# Patient Record
Sex: Male | Born: 1955 | Race: White | Hispanic: No | Smoking: Former smoker
Health system: Southern US, Community
[De-identification: ages and names within clinical notes are randomized; demographics above are authoritative.]

## PROBLEM LIST (undated history)

## (undated) DIAGNOSIS — R51 Headache: Secondary | ICD-10-CM

## (undated) DIAGNOSIS — E785 Hyperlipidemia, unspecified: Secondary | ICD-10-CM

## (undated) DIAGNOSIS — R05 Cough: Secondary | ICD-10-CM

## (undated) DIAGNOSIS — Z87891 Personal history of nicotine dependence: Secondary | ICD-10-CM

## (undated) DIAGNOSIS — R079 Chest pain, unspecified: Secondary | ICD-10-CM

## (undated) DIAGNOSIS — G8929 Other chronic pain: Secondary | ICD-10-CM

## (undated) DIAGNOSIS — I2510 Atherosclerotic heart disease of native coronary artery without angina pectoris: Secondary | ICD-10-CM

## (undated) DIAGNOSIS — M797 Fibromyalgia: Secondary | ICD-10-CM

## (undated) DIAGNOSIS — R059 Cough, unspecified: Secondary | ICD-10-CM

## (undated) DIAGNOSIS — I10 Essential (primary) hypertension: Secondary | ICD-10-CM

## (undated) DIAGNOSIS — I313 Pericardial effusion (noninflammatory): Secondary | ICD-10-CM

## (undated) DIAGNOSIS — J189 Pneumonia, unspecified organism: Secondary | ICD-10-CM

## (undated) DIAGNOSIS — M542 Cervicalgia: Secondary | ICD-10-CM

## (undated) DIAGNOSIS — I3139 Other pericardial effusion (noninflammatory): Secondary | ICD-10-CM

## (undated) DIAGNOSIS — Z720 Tobacco use: Secondary | ICD-10-CM

## (undated) HISTORY — PX: NASAL SINUS SURGERY: SHX719

## (undated) HISTORY — DX: Chest pain, unspecified: R07.9

## (undated) HISTORY — DX: Cough: R05

## (undated) HISTORY — DX: Tobacco use: Z72.0

## (undated) HISTORY — PX: APPENDECTOMY: SHX54

## (undated) HISTORY — PX: MOUTH SURGERY: SHX715

## (undated) HISTORY — DX: Cough, unspecified: R05.9

---

## 1998-01-28 ENCOUNTER — Emergency Department (HOSPITAL_COMMUNITY): Admission: EM | Admit: 1998-01-28 | Discharge: 1998-01-28 | Payer: Self-pay

## 2001-08-07 ENCOUNTER — Ambulatory Visit (HOSPITAL_BASED_OUTPATIENT_CLINIC_OR_DEPARTMENT_OTHER): Admission: RE | Admit: 2001-08-07 | Discharge: 2001-08-07 | Payer: Self-pay | Admitting: Otolaryngology

## 2001-10-18 ENCOUNTER — Encounter: Admission: RE | Admit: 2001-10-18 | Discharge: 2002-01-16 | Payer: Self-pay | Admitting: Internal Medicine

## 2002-05-07 ENCOUNTER — Encounter: Payer: Self-pay | Admitting: Internal Medicine

## 2002-05-07 ENCOUNTER — Ambulatory Visit (HOSPITAL_COMMUNITY): Admission: RE | Admit: 2002-05-07 | Discharge: 2002-05-07 | Payer: Self-pay | Admitting: Internal Medicine

## 2002-10-26 ENCOUNTER — Emergency Department (HOSPITAL_COMMUNITY): Admission: EM | Admit: 2002-10-26 | Discharge: 2002-10-26 | Payer: Self-pay | Admitting: Emergency Medicine

## 2002-10-26 ENCOUNTER — Encounter: Payer: Self-pay | Admitting: Emergency Medicine

## 2002-11-12 ENCOUNTER — Encounter: Payer: Self-pay | Admitting: Internal Medicine

## 2002-11-12 ENCOUNTER — Encounter: Admission: RE | Admit: 2002-11-12 | Discharge: 2002-11-12 | Payer: Self-pay | Admitting: Internal Medicine

## 2003-01-12 ENCOUNTER — Encounter: Payer: Self-pay | Admitting: Emergency Medicine

## 2003-01-12 ENCOUNTER — Emergency Department (HOSPITAL_COMMUNITY): Admission: EM | Admit: 2003-01-12 | Discharge: 2003-01-12 | Payer: Self-pay | Admitting: Emergency Medicine

## 2003-10-29 ENCOUNTER — Encounter: Admission: RE | Admit: 2003-10-29 | Discharge: 2003-10-29 | Payer: Self-pay | Admitting: Internal Medicine

## 2004-07-14 ENCOUNTER — Ambulatory Visit: Payer: Self-pay | Admitting: Internal Medicine

## 2004-10-01 ENCOUNTER — Ambulatory Visit: Payer: Self-pay | Admitting: Internal Medicine

## 2004-11-30 ENCOUNTER — Ambulatory Visit: Payer: Self-pay | Admitting: Internal Medicine

## 2005-01-04 ENCOUNTER — Emergency Department (HOSPITAL_COMMUNITY): Admission: EM | Admit: 2005-01-04 | Discharge: 2005-01-04 | Payer: Self-pay | Admitting: Emergency Medicine

## 2005-09-30 ENCOUNTER — Ambulatory Visit: Payer: Self-pay | Admitting: Internal Medicine

## 2006-10-19 ENCOUNTER — Ambulatory Visit: Payer: Self-pay | Admitting: Internal Medicine

## 2006-11-22 ENCOUNTER — Ambulatory Visit: Payer: Self-pay | Admitting: Internal Medicine

## 2007-01-24 ENCOUNTER — Ambulatory Visit: Payer: Self-pay | Admitting: Internal Medicine

## 2007-03-21 ENCOUNTER — Telehealth: Payer: Self-pay | Admitting: Internal Medicine

## 2007-04-12 DIAGNOSIS — R519 Headache, unspecified: Secondary | ICD-10-CM | POA: Insufficient documentation

## 2007-04-12 DIAGNOSIS — R51 Headache: Secondary | ICD-10-CM

## 2007-04-26 ENCOUNTER — Ambulatory Visit: Payer: Self-pay | Admitting: Internal Medicine

## 2007-04-26 DIAGNOSIS — IMO0001 Reserved for inherently not codable concepts without codable children: Secondary | ICD-10-CM

## 2007-08-11 ENCOUNTER — Ambulatory Visit: Payer: Self-pay | Admitting: Internal Medicine

## 2007-08-11 DIAGNOSIS — M542 Cervicalgia: Secondary | ICD-10-CM | POA: Insufficient documentation

## 2007-08-15 ENCOUNTER — Encounter: Payer: Self-pay | Admitting: Internal Medicine

## 2007-08-15 ENCOUNTER — Telehealth: Payer: Self-pay | Admitting: Internal Medicine

## 2007-08-16 ENCOUNTER — Telehealth: Payer: Self-pay | Admitting: Internal Medicine

## 2007-10-11 ENCOUNTER — Ambulatory Visit: Payer: Self-pay | Admitting: Internal Medicine

## 2008-01-29 ENCOUNTER — Ambulatory Visit: Payer: Self-pay | Admitting: Internal Medicine

## 2008-01-29 DIAGNOSIS — N410 Acute prostatitis: Secondary | ICD-10-CM

## 2008-01-29 DIAGNOSIS — N10 Acute tubulo-interstitial nephritis: Secondary | ICD-10-CM

## 2008-12-20 ENCOUNTER — Emergency Department (HOSPITAL_COMMUNITY): Admission: EM | Admit: 2008-12-20 | Discharge: 2008-12-20 | Payer: Self-pay | Admitting: Emergency Medicine

## 2010-12-18 NOTE — Op Note (Signed)
[place]  Patient:    Michael Spears, Michael Spears Visit Number: [id number] MRN: [id number]          Service Type: DSU Location: [facility] Attending Physician:  Susy Frizzle Dictated by:   Jeannett Senior Pollyann Kennedy, M.D. Proc. Date: 08/07/01 Admit Date:  08/07/2001   CC:         Stacie Glaze, M.D. [facility]   Operative Report  PREOPERATIVE DIAGNOSES: 1. Nasal septal deviation. 2. Inferior turbinate hypertrophy.  POSTOPERATIVE DIAGNOSES: 1. Nasal septal deviation. 2. Inferior turbinate hypertrophy.  OPERATION: 1. Nasal septoplasty. 2. Submucous resection inferior turbinates bilaterally.  SURGEON:  Jefry H. Pollyann Kennedy, M.D.  ANESTHESIA:  General endotracheal  COMPLICATIONS:  None.  ESTIMATED BLOOD LOSS: 15 cc  FINDINGS:  Moderate enlargement of the inferior turbinate with thickened bone in the anterior half on both sides.  Large bony spur on the left inferior nasal septum created by the left lamina of the maxillary crest being deflected towards the left.  Mild to moderate deviation of the ethmoid plate towards the right side causing partial obstruction on the right posteriorly.  REFERRING PHYSICIAN:  Stacie Glaze, M.D.  HISTORY:  This is a 55 year old gentleman with a history of chronic nasal obstruction.  The risks, benefits, alternatives, and complications to the procedure were explained to the patient who seemed to understand and agreed to surgery.  DESCRIPTION OF PROCEDURE:  The patient was taken to the operating room and placed on the operating room table in the supine position. Following induction of general endotracheal anesthesia, the table was turned and the patient was properly positioned.  Xylocaine 1% with epinephrine was infiltrated into the septum, columella and inferior turbinates bilaterally.  A total of 5 cc was used.  Afrin soaked pledgets were placed in the nasal cavities bilaterally. Heparin spray was also used preoperatively.  1. Nasal  septoplasty.  Left hemitransfixion incision was created with a 15 scalpel used to approach the septal cartilage and mucoperichondrial flap was developed posteriorly down the left side to the sphenoid rostrum.  The bony cartilaginous junction was divided and a similar flap was developed posteriorly down the right side.  The superior attachment of the ethmoid plate was taken down with an open Jansen-Middleton rongeur.  The posterior middle attachments were taken down also with rongeurs and large fragments of deflected ethmoid plate were resected.  The mucosa was elevated off of the left anterior lower aspect of the septum where the large bony spur was present.  A 4 mm osteotome was used to resect this long fragment of bony spur. The mucosal incision was reapproximated with 4-0 chromic suture.  The septal flaps were quilted with 4-0 plain gut.  2. Submucous resection of inferior turbinates.  The leading edge of the inferior turbinates were incised vertically with a scalpel.  The mucosa was elevated off the medial lateral and inferior aspect of the turbinate bone. Large thickened fragments of turbinate bone were resected using Takahashi forceps.  The mucosa was left intact and viable.  The turbinate remnants were out fractured with a Therapist, nutritional.  The nasal cavities were suctioned of blood and secretions and packed with rolled up Telfa gauze coated with Bacitracin ointment.  The patient was then awakened, extubated and transferred to recovery. Dictated by:   Jeannett Senior Pollyann Kennedy, M.D. Attending Physician:  Susy Frizzle DD:  08/07/01 TD:  08/07/01 Job: [id number]

## 2011-12-06 ENCOUNTER — Emergency Department (HOSPITAL_COMMUNITY): Payer: PRIVATE HEALTH INSURANCE

## 2011-12-06 ENCOUNTER — Inpatient Hospital Stay (HOSPITAL_COMMUNITY)
Admission: EM | Admit: 2011-12-06 | Discharge: 2011-12-15 | DRG: 234 | Disposition: A | Discharge status: Home / Self Care | Payer: PRIVATE HEALTH INSURANCE | Source: Ambulatory Visit | Attending: Cardiothoracic Surgery | Admitting: Cardiothoracic Surgery

## 2011-12-06 DIAGNOSIS — I214 Non-ST elevation (NSTEMI) myocardial infarction: Secondary | ICD-10-CM

## 2011-12-06 DIAGNOSIS — I10 Essential (primary) hypertension: Secondary | ICD-10-CM | POA: Diagnosis present

## 2011-12-06 DIAGNOSIS — I200 Unstable angina: Secondary | ICD-10-CM | POA: Diagnosis present

## 2011-12-06 DIAGNOSIS — K5900 Constipation, unspecified: Secondary | ICD-10-CM | POA: Diagnosis not present

## 2011-12-06 DIAGNOSIS — Z6829 Body mass index (BMI) 29.0-29.9, adult: Secondary | ICD-10-CM

## 2011-12-06 DIAGNOSIS — I2510 Atherosclerotic heart disease of native coronary artery without angina pectoris: Principal | ICD-10-CM

## 2011-12-06 DIAGNOSIS — D62 Acute posthemorrhagic anemia: Secondary | ICD-10-CM | POA: Diagnosis not present

## 2011-12-06 DIAGNOSIS — E785 Hyperlipidemia, unspecified: Secondary | ICD-10-CM | POA: Diagnosis present

## 2011-12-06 DIAGNOSIS — Z87891 Personal history of nicotine dependence: Secondary | ICD-10-CM

## 2011-12-06 DIAGNOSIS — D696 Thrombocytopenia, unspecified: Secondary | ICD-10-CM | POA: Diagnosis not present

## 2011-12-06 DIAGNOSIS — Z951 Presence of aortocoronary bypass graft: Secondary | ICD-10-CM

## 2011-12-06 DIAGNOSIS — E669 Obesity, unspecified: Secondary | ICD-10-CM | POA: Diagnosis present

## 2011-12-06 LAB — CBC
MCHC: 35.6 g/dL (ref 30.0–36.0)
RDW: 12.8 % (ref 11.5–15.5)

## 2011-12-06 LAB — DIFFERENTIAL
Lymphocytes Relative: 33 % (ref 12–46)
Neutro Abs: 4.3 10*3/uL (ref 1.7–7.7)
Neutrophils Relative %: 54 % (ref 43–77)

## 2011-12-06 LAB — BASIC METABOLIC PANEL
CO2: 26 mEq/L (ref 19–32)
Calcium: 10 mg/dL (ref 8.4–10.5)
Chloride: 103 mEq/L (ref 96–112)
Creatinine, Ser: 1.34 mg/dL (ref 0.50–1.35)
Glucose, Bld: 86 mg/dL (ref 70–99)
Potassium: 4.5 mEq/L (ref 3.5–5.1)
Sodium: 139 mEq/L (ref 135–145)

## 2011-12-06 MED ORDER — ASPIRIN 81 MG PO CHEW
324.0000 mg | CHEWABLE_TABLET | Freq: Once | ORAL | Status: AC
  Administered 2011-12-06: 324 mg via ORAL
  Filled 2011-12-06: qty 4

## 2011-12-06 MED ORDER — SIMVASTATIN 40 MG PO TABS
40.0000 mg | ORAL_TABLET | Freq: Every day | ORAL | Status: DC
  Administered 2011-12-07 – 2011-12-09 (×3): 40 mg via ORAL
  Filled 2011-12-06 (×4): qty 1

## 2011-12-06 MED ORDER — HEPARIN (PORCINE) IN NACL 100-0.45 UNIT/ML-% IJ SOLN
1300.0000 [IU]/h | INTRAMUSCULAR | Status: DC
  Administered 2011-12-06: 1300 [IU]/h via INTRAVENOUS
  Filled 2011-12-06 (×3): qty 250

## 2011-12-06 MED ORDER — ACETAMINOPHEN 325 MG PO TABS
650.0000 mg | ORAL_TABLET | ORAL | Status: DC | PRN
  Administered 2011-12-06: 650 mg via ORAL
  Filled 2011-12-06: qty 2

## 2011-12-06 MED ORDER — HEPARIN BOLUS VIA INFUSION: 4000.0000 [IU] | Freq: Once | INTRAVENOUS | Status: DC

## 2011-12-06 MED ORDER — ONDANSETRON HCL 4 MG/2ML IJ SOLN: 4.0000 mg | Freq: Four times a day (QID) | INTRAMUSCULAR | Status: DC | PRN

## 2011-12-06 MED ORDER — ASPIRIN EC 81 MG PO TBEC
81.0000 mg | DELAYED_RELEASE_TABLET | Freq: Every day | ORAL | Status: DC
  Administered 2011-12-07 – 2011-12-09 (×3): 81 mg via ORAL
  Filled 2011-12-06 (×4): qty 1

## 2011-12-06 MED ORDER — NITROGLYCERIN IN D5W 200-5 MCG/ML-% IV SOLN
5.0000 ug/min | Freq: Once | INTRAVENOUS | Status: AC
  Administered 2011-12-06: 5 ug/min via INTRAVENOUS
  Filled 2011-12-06: qty 250

## 2011-12-06 MED ORDER — HEPARIN SODIUM (PORCINE) 5000 UNIT/ML IJ SOLN
INTRAMUSCULAR | Status: AC
  Administered 2011-12-06: 4000 [IU]
  Filled 2011-12-06: qty 1

## 2011-12-06 MED ORDER — METOPROLOL TARTRATE 25 MG PO TABS
25.0000 mg | ORAL_TABLET | Freq: Two times a day (BID) | ORAL | Status: DC
  Administered 2011-12-07 – 2011-12-09 (×7): 25 mg via ORAL
  Filled 2011-12-06 (×9): qty 1

## 2011-12-06 MED ORDER — NITROGLYCERIN 0.4 MG SL SUBL: 0.4000 mg | SUBLINGUAL_TABLET | SUBLINGUAL | Status: DC | PRN

## 2011-12-06 MED ORDER — SODIUM CHLORIDE 0.9 % IV SOLN
INTRAVENOUS | Status: DC
  Administered 2011-12-06: via INTRAVENOUS

## 2011-12-06 NOTE — ED Provider Notes (Signed)
8:26 PM  Date: 12/06/2011  Rate: 78  Rhythm: normal sinus rhythm  QRS Axis: normal  Intervals: normal  ST/T Wave abnormalities: nonspecific ST changes  Conduction Disutrbances:none  Narrative Interpretation: Abnormal EKG.   Old EKG Reviewed: changes noted--rate was more rapid on 10/26/2002, 114 vs 78 today.    Carleene Cooper III, MD 12/06/11 2028

## 2011-12-06 NOTE — ED Notes (Signed)
The pt has had some lt upper chest pain since Friday intermittently with some sob.  The sob is worse with exertion.  No previous history. The pt has no chest pain at present

## 2011-12-06 NOTE — ED Notes (Signed)
PT. REPORTS MID CHEST PAIN WITH SOB AND SLIGHT NAUSEA ONSET LAST Friday MORNING , DENIES VOMITTING OR DIAPHORESIS.

## 2011-12-06 NOTE — H&P (Signed)
Cardiology History and Physical  Carrie Mew, MD, MD  History of Present Illness (and review of medical records): Michael Spears is a 56 y.o. male who presents for evaluation of chest pain.  He reports these symptoms for past 4 days.  Pain started late Thursday nite/early fri around 2am awoke him out of sleep.  Pain was described as harsh intense, left sided pain that lasted around .  Pain was 8/10 with associated nausea only.  He felt slightly fatigued on Friday but was able to work.  He then reported chest pain with exertion on sat and sun while doing yard work.  Pain would be relieved by rest.  He went to clinic today and was urged to come to ED for further evaluation.  He is currently chest pain free at this time.  Previous diagnostic testing for coronary artery disease includes: stress testing approximately 38yrs prior. Previous history of cardiac disease includes None. Coronary artery disease risk factors include: advanced age (older than 98 for men, 66 for women), hypertension, male gender and obesity (BMI >= 30 kg/m2). Patient denies history of CABG, coronary angioplasty, coronary artery disease, previous M.I. and valvular disease.  Review of Systems Further review of systems was otherwise negative other than stated in HPI.  Patient Active Problem List  Diagnoses Date Noted  . Unstable angina 12/06/2011  . PYELONEPHRITIS, ACUTE 01/29/2008  . ACUTE PROSTATITIS 01/29/2008  . NECK PAIN, CHRONIC 08/11/2007  . FIBROMYALGIA, SEVERE 04/26/2007  . HEADACHE 04/12/2007   No past medical history on file.  No past surgical history on file.  Prescriptions prior to admission  Medication Sig Dispense Refill  . Acetaminophen-Aspirin Buffered (EXCEDRIN BACK & BODY PO) Take 2 tablets by mouth See admin instructions. Takes 2 tabs twice daily every other day      . metoprolol succinate (TOPROL-XL) 50 MG 24 hr tablet Take 50 mg by mouth daily. Take with or immediately following a meal.        No Known Allergies  History  Substance Use Topics  . Smoking status: Not on file  . Smokeless tobacco: Not on file  . Alcohol Use: Not on file    No family history on file.   Objective: Patient Vitals for the past 8 hrs:  BP Temp Temp src Pulse Resp SpO2 Height Weight  12/07/11 0346 131/79 mmHg 98.7 F (37.1 C) Oral 78  12  94 % - -  12/06/11 2328 127/79 mmHg 97.8 F (36.6 C) Oral 77  16  93 % 5\' 10"  (1.778 m) 93.3 kg (205 lb 11 oz)  12/06/11 2321 - - - - - 93 % - -  12/06/11 2152 138/80 mmHg - - - - - 5\' 10"  (1.778 m) 92.08 kg (203 lb)  12/06/11 2111 154/110 mmHg 98.3 F (36.8 C) Oral 79  20  94 % - -  12/06/11 2107 154/110 mmHg - - - - - - -   General Appearance:    Alert, cooperative, no distress, appears stated age  Head:    Normocephalic, without obvious abnormality, atraumatic  Eyes:     PERRL, EOMI, anicteric sclerae  Neck:   Supple, no carotid bruit or JVD  Lungs:     Clear to auscultation bilaterally, respirations unlabored  Heart:    Regular rate and rhythm, S1 and S2 normal, no murmur  Abdomen:     Soft, non-tender, normoactive bowel sounds  Extremities:   Extremities normal, atraumatic, no cyanosis or edema  Pulses:   2+  and symmetric all extremities  Skin:   no rashes or lesions  Neurologic:   No focal deficits. AAO x3   Results for orders placed during the hospital encounter of 12/06/11 (from the past 48 hour(s))  BASIC METABOLIC PANEL     Status: Abnormal   Collection Time   12/06/11  8:00 PM      Component Value Range Comment   Sodium 139  135 - 145 (mEq/L)    Potassium 4.5  3.5 - 5.1 (mEq/L)    Chloride 103  96 - 112 (mEq/L)    CO2 26  19 - 32 (mEq/L)    Glucose, Bld 86  70 - 99 (mg/dL)    BUN 11  6 - 23 (mg/dL)    Creatinine, Ser 4.78  0.50 - 1.35 (mg/dL)    Calcium 29.5  8.4 - 10.5 (mg/dL)    GFR calc non Af Amer 58 (*) >90 (mL/min)    GFR calc Af Amer 67 (*) >90 (mL/min)   CBC     Status: Normal   Collection Time   12/06/11  8:00 PM       Component Value Range Comment   WBC 8.0  4.0 - 10.5 (K/uL)    RBC 5.29  4.22 - 5.81 (MIL/uL)    Hemoglobin 16.1  13.0 - 17.0 (g/dL)    HCT 62.1  30.8 - 65.7 (%)    MCV 85.4  78.0 - 100.0 (fL)    MCH 30.4  26.0 - 34.0 (pg)    MCHC 35.6  30.0 - 36.0 (g/dL)    RDW 84.6  96.2 - 95.2 (%)    Platelets 194  150 - 400 (K/uL)   DIFFERENTIAL     Status: Normal   Collection Time   12/06/11  8:00 PM      Component Value Range Comment   Neutrophils Relative 54  43 - 77 (%)    Neutro Abs 4.3  1.7 - 7.7 (K/uL)    Lymphocytes Relative 33  12 - 46 (%)    Lymphs Abs 2.6  0.7 - 4.0 (K/uL)    Monocytes Relative 10  3 - 12 (%)    Monocytes Absolute 0.8  0.1 - 1.0 (K/uL)    Eosinophils Relative 3  0 - 5 (%)    Eosinophils Absolute 0.2  0.0 - 0.7 (K/uL)    Basophils Relative 0  0 - 1 (%)    Basophils Absolute 0.0  0.0 - 0.1 (K/uL)   CARDIAC PANEL(CRET KIN+CKTOT+MB+TROPI)     Status: Abnormal   Collection Time   12/06/11 11:55 PM      Component Value Range Comment   Total CK 204  7 - 232 (U/L)    CK, MB 4.6 (*) 0.3 - 4.0 (ng/mL)    Troponin I <0.30  <0.30 (ng/mL)    Relative Index 2.3  0.0 - 2.5    PRO B NATRIURETIC PEPTIDE     Status: Normal   Collection Time   12/06/11 11:55 PM      Component Value Range Comment   Pro B Natriuretic peptide (BNP) 41.6  0 - 125 (pg/mL)    Dg Chest 2 View  12/06/2011  *RADIOLOGY REPORT*  Clinical Data: Chest pain and shortness of breath.  CHEST - 2 VIEW  Comparison: None.  Findings: The lungs are clear without focal infiltrate, edema, pneumothorax or pleural effusion. Interstitial markings are diffusely coarsened with chronic features. Cardiopericardial silhouette is at upper limits of normal for size.  Imaged bony structures of the thorax are intact.  IMPRESSION: Underlying chronic interstitial changes.  No acute cardiopulmonary findings.  Original Report Authenticated By: ERIC A. MANSELL, M.D.    ECG:  Sinus rhythm HR 78, no acute ischemic changes  Assessment: 43M with  hx of HTN, prior tobacco abuse presents with chest pain concerning for unstable angina.  Plan:  1. Admit to Cardiology, Telemetry Unit 2. Repeat ekg on admit, prn chest pain or arrythmia 3. Trend cardiac biomarkers, check lipids, hgba1c, tsh 4. Medical management to include ASA, Heparin, BB, Statin, NTG prn 5. Keep NPO 6. Plan for further ischemic evaluation with cardiac catheterization in am. 7. Discussed plan with patient in detail and answered all questions.  Agreeable to proceed.

## 2011-12-06 NOTE — ED Notes (Signed)
Report called to 2000 

## 2011-12-06 NOTE — ED Notes (Signed)
No order for iv nitro.  Nitro drip discontinued

## 2011-12-06 NOTE — ED Notes (Signed)
No chest pain

## 2011-12-06 NOTE — ED Notes (Signed)
Troponin results of 0.10 ng/mL reported to Dr. Ignacia Palma by B. Bing Plume, EMT

## 2011-12-06 NOTE — ED Provider Notes (Signed)
History     CSN: [id number]  Arrival date & time 12/06/11  1940   First MD Initiated Contact with Patient 12/06/11 2016      Chief Complaint  Patient presents with  . Chest Pain    (Consider location/radiation/quality/duration/timing/severity/associated sxs/prior treatment) HPI  Patient presents to the ED with  Complaints of chest pain since Friday night. The patient states that his pain is midsternum and feels sharp and dull. He also had SOB and nausea. Denies vomiting and diaphoresis. He states that his pain worsened with activity and would completely resolve with with rest. He currently is not having any pain. He has taken a baby aspirin this morning. He denies family history or self history of cardiac dz. He admits to hypertension. Pt in NAD at this time.  No past medical history on file.  No past surgical history on file.  No family history on file.  History  Substance Use Topics  . Smoking status: Not on file  . Smokeless tobacco: Not on file  . Alcohol Use: Not on file      Review of Systems   HEENT: denies blurry vision or change in hearing PULMONARY: Denies difficulty breathing and SOB CARDIAC: denies palpitation MUSCULOSKELETAL:  denies being unable to ambulate ABDOMEN AL: denies abdominal pain GU: denies loss of bowel or urinary control NEURO: denies numbness and tingling in extremities   Allergies  Review of patient's allergies indicates no known allergies.  Home Medications   Current Outpatient Rx  Name Route Sig Dispense Refill  . EXCEDRIN BACK & BODY PO Oral Take 2 tablets by mouth See admin instructions. Takes 2 tabs twice daily every other day    . METOPROLOL SUCCINATE ER 50 MG PO TB24 Oral Take 50 mg by mouth daily. Take with or immediately following a meal.      BP 154/110  Pulse 79  Temp(Src) 98.3 F (36.8 C) (Oral)  Resp 20  SpO2 94%  Physical Exam  Nursing note and vitals reviewed. Constitutional: He appears well-developed and  well-nourished. No distress.  HENT:  Head: Normocephalic and atraumatic.  Eyes: Pupils are equal, round, and reactive to light.  Neck: Normal range of motion. Neck supple.  Cardiovascular: Normal rate and regular rhythm.        No pain to palpation.  Pulmonary/Chest: Effort normal.  Abdominal: Soft.  Neurological: He is alert.  Skin: Skin is warm and dry.    ED Course  Procedures (including critical care time)  Labs Reviewed  BASIC METABOLIC PANEL - Abnormal; Notable for the following:    GFR calc non Af Amer 58 (*)    GFR calc Af Amer 67 (*)    All other components within normal limits  CBC  DIFFERENTIAL   Dg Chest 2 View  12/06/2011  *RADIOLOGY REPORT*  Clinical Data: Chest pain and shortness of breath.  CHEST - 2 VIEW  Comparison: None.  Findings: The lungs are clear without focal infiltrate, edema, pneumothorax or pleural effusion. Interstitial markings are diffusely coarsened with chronic features. Cardiopericardial silhouette is at upper limits of normal for size. Imaged bony structures of the thorax are intact.  IMPRESSION: Underlying chronic interstitial changes.  No acute cardiopulmonary findings.  Original Report Authenticated By: ERIC A. MANSELL, M.D.     1. NSTEMI (non-ST elevated myocardial infarction)       MDM  Lab called and reported the patient has a positive troponin. IV Heprin and nitro started. Dr. Ignacia Palma has seen patient and is consulting  cardiology.  Dr. Gardenia Phlegm with cardiology to see patient.  Pt currently stable and pain free.    Dorthula Matas, PA 12/06/11 2114  Dorthula Matas, PA 12/06/11 2115

## 2011-12-06 NOTE — Progress Notes (Signed)
ANTICOAGULATION CONSULT NOTE - Initial Consult  Pharmacy Consult for Heparin Indication: chest pain/ACS  No Known Allergies  Patient Measurements: Height: 5\' 10"  (177.8 cm) Weight: 203 lb (92.08 kg) IBW/kg (Calculated) : 73  Heparin Dosing Weight: 92 kg  Vital Signs: Temp: 98.3 F (36.8 C) (05/06 2111) Temp src: Oral (05/06 2111) BP: 138/80 mmHg (05/06 2152) Pulse Rate: 79  (05/06 2111)  Labs:  Basename 12/06/11 2000  HGB 16.1  HCT 45.2  PLT 194  APTT --  LABPROT --  INR --  HEPARINUNFRC --  CREATININE 1.34  CKTOTAL --  CKMB --  TROPONINI --   Estimated Creatinine Clearance: 70.2 ml/min (by C-G formula based on Cr of 1.34).  Medical History: No past medical history on file.  Assessment:   Noted chest pain since Friday night, 12/03/11.  STEMI.  Currently pain free, on  IV Nitrogylcerin at 5 mcg/min.  Goal of Therapy:  Heparin level 0.3-0.7 units/ml   Plan:    Heparin 4000 units IV bolus as ordered.   Will increase heparin infusion from 1000 to 1300 units/hr, ~ 14 units/kg/hr.  First heparin level in 6 hours. Daily heparin level and CBC.   Dennie Fetters, [place] Pager: [phone] 12/06/2011,10:08 PM

## 2011-12-07 ENCOUNTER — Encounter (HOSPITAL_COMMUNITY)
Admission: EM | Disposition: A | Discharge status: Home / Self Care | Payer: Self-pay | Source: Ambulatory Visit | Attending: Cardiothoracic Surgery

## 2011-12-07 ENCOUNTER — Other Ambulatory Visit: Payer: Self-pay | Admitting: Cardiothoracic Surgery

## 2011-12-07 ENCOUNTER — Encounter (HOSPITAL_COMMUNITY): Payer: Self-pay | Admitting: *Deleted

## 2011-12-07 DIAGNOSIS — Z01810 Encounter for preprocedural cardiovascular examination: Secondary | ICD-10-CM

## 2011-12-07 DIAGNOSIS — E785 Hyperlipidemia, unspecified: Secondary | ICD-10-CM | POA: Diagnosis present

## 2011-12-07 DIAGNOSIS — I2510 Atherosclerotic heart disease of native coronary artery without angina pectoris: Secondary | ICD-10-CM

## 2011-12-07 DIAGNOSIS — R079 Chest pain, unspecified: Secondary | ICD-10-CM

## 2011-12-07 HISTORY — PX: LEFT HEART CATHETERIZATION WITH CORONARY ANGIOGRAM: SHX5451

## 2011-12-07 LAB — CARDIAC PANEL(CRET KIN+CKTOT+MB+TROPI)
CK, MB: 3 ng/mL (ref 0.3–4.0)
Relative Index: 2 (ref 0.0–2.5)
Relative Index: 2.2 (ref 0.0–2.5)
Relative Index: 2.3 (ref 0.0–2.5)
Total CK: 204 U/L (ref 7–232)
Troponin I: <0.3 ng/mL (ref <0.30)
Troponin I: <0.3 ng/mL (ref <0.30)

## 2011-12-07 LAB — PRO B NATRIURETIC PEPTIDE: Pro B Natriuretic peptide (BNP): 41.6 pg/mL (ref 0–125)

## 2011-12-07 LAB — HEPARIN LEVEL (UNFRACTIONATED): Heparin Unfractionated: 0.43 IU/mL (ref 0.30–0.70)

## 2011-12-07 LAB — RAPID URINE DRUG SCREEN, HOSP PERFORMED
Amphetamines: NOT DETECTED
Barbiturates: NOT DETECTED
Benzodiazepines: NOT DETECTED
Cocaine: NOT DETECTED
Tetrahydrocannabinol: NOT DETECTED

## 2011-12-07 LAB — LIPID PANEL
Cholesterol: 206 mg/dL — ABNORMAL HIGH (ref 0–200)
HDL: 35 mg/dL — ABNORMAL LOW (ref >39)
Triglycerides: 150 mg/dL — ABNORMAL HIGH (ref <150)

## 2011-12-07 LAB — CBC
HCT: 42.9 % (ref 39.0–52.0)
Platelets: 178 10*3/uL (ref 150–400)
RDW: 12.8 % (ref 11.5–15.5)
WBC: 7.7 10*3/uL (ref 4.0–10.5)

## 2011-12-07 LAB — HEMOGLOBIN A1C: Hgb A1c MFr Bld: 5.9 % — ABNORMAL HIGH (ref <5.7)

## 2011-12-07 LAB — BASIC METABOLIC PANEL
BUN: 12 mg/dL (ref 6–23)
Chloride: 104 mEq/L (ref 96–112)
Creatinine, Ser: 1.33 mg/dL (ref 0.50–1.35)
GFR calc Af Amer: 68 mL/min — ABNORMAL LOW (ref >90)
Glucose, Bld: 97 mg/dL (ref 70–99)
Potassium: 4 mEq/L (ref 3.5–5.1)

## 2011-12-07 SURGERY — LEFT HEART CATHETERIZATION WITH CORONARY ANGIOGRAM
Anesthesia: LOCAL

## 2011-12-07 MED ORDER — HEPARIN SODIUM (PORCINE) 1000 UNIT/ML IJ SOLN
INTRAMUSCULAR | Status: AC
  Filled 2011-12-07: qty 1

## 2011-12-07 MED ORDER — MIDAZOLAM HCL 2 MG/2ML IJ SOLN
INTRAMUSCULAR | Status: AC
  Filled 2011-12-07: qty 2

## 2011-12-07 MED ORDER — SODIUM CHLORIDE 0.9 % IJ SOLN: 3.0000 mL | INTRAMUSCULAR | Status: DC | PRN

## 2011-12-07 MED ORDER — HEPARIN (PORCINE) IN NACL 100-0.45 UNIT/ML-% IJ SOLN
1300.0000 [IU]/h | INTRAMUSCULAR | Status: DC
  Filled 2011-12-07: qty 250

## 2011-12-07 MED ORDER — ASPIRIN 81 MG PO CHEW
324.0000 mg | CHEWABLE_TABLET | ORAL | Status: AC
  Administered 2011-12-07: 324 mg via ORAL
  Filled 2011-12-07: qty 4

## 2011-12-07 MED ORDER — SODIUM CHLORIDE 0.9 % IV SOLN: 250.0000 mL | INTRAVENOUS | Status: DC | PRN

## 2011-12-07 MED ORDER — HEPARIN (PORCINE) IN NACL 2-0.9 UNIT/ML-% IJ SOLN
INTRAMUSCULAR | Status: AC
  Filled 2011-12-07: qty 2000

## 2011-12-07 MED ORDER — LIDOCAINE HCL (PF) 1 % IJ SOLN
INTRAMUSCULAR | Status: AC
  Filled 2011-12-07: qty 30

## 2011-12-07 MED ORDER — NITROGLYCERIN 0.2 MG/ML ON CALL CATH LAB
INTRAVENOUS | Status: AC
  Filled 2011-12-07: qty 1

## 2011-12-07 MED ORDER — DIAZEPAM 5 MG PO TABS
5.0000 mg | ORAL_TABLET | ORAL | Status: AC
  Administered 2011-12-07: 5 mg via ORAL
  Filled 2011-12-07: qty 1

## 2011-12-07 MED ORDER — HEPARIN (PORCINE) IN NACL 100-0.45 UNIT/ML-% IJ SOLN
1300.0000 [IU]/h | INTRAMUSCULAR | Status: DC
  Administered 2011-12-07 – 2011-12-09 (×4): 1300 [IU]/h via INTRAVENOUS
  Filled 2011-12-07 (×4): qty 250

## 2011-12-07 MED ORDER — SODIUM CHLORIDE 0.9 % IV SOLN
INTRAVENOUS | Status: AC
  Administered 2011-12-07: 17:00:00 via INTRAVENOUS

## 2011-12-07 MED ORDER — SODIUM CHLORIDE 0.9 % IV SOLN
1.0000 mL/kg/h | INTRAVENOUS | Status: DC
  Administered 2011-12-07 (×2): 1 mL/kg/h via INTRAVENOUS

## 2011-12-07 MED ORDER — FENTANYL CITRATE 0.05 MG/ML IJ SOLN
INTRAMUSCULAR | Status: AC
  Filled 2011-12-07: qty 2

## 2011-12-07 MED ORDER — SODIUM CHLORIDE 0.9 % IJ SOLN: 3.0000 mL | Freq: Two times a day (BID) | INTRAMUSCULAR | Status: DC

## 2011-12-07 NOTE — Progress Notes (Signed)
 Patient Name: Michael Spears Date of Encounter: 12/07/2011  Active Problems:  Unstable angina    SUBJECTIVE: No chest pain since admission, never had at rest, all symptoms PTA were exertional. No SOB  OBJECTIVE Filed Vitals:   12/06/11 2152 12/06/11 2321 12/06/11 2328 12/07/11 0346  BP: 138/80  127/79 131/79  Pulse:   77 78  Temp:   97.8 F (36.6 C) 98.7 F (37.1 C)  TempSrc:   Oral Oral  Resp:   16 12  Height: 5' 10" (1.778 m)  5' 10" (1.778 m)   Weight: 203 lb (92.08 kg)  205 lb 11 oz (93.3 kg)   SpO2:  93% 93% 94%   No intake or output data in the 24 hours ending 12/07/11 0805 Weight change:  Filed Weights   12/06/11 2152 12/06/11 2328  Weight: 203 lb (92.08 kg) 205 lb 11 oz (93.3 kg)     PHYSICAL EXAM General: Well developed, well nourished, male in no acute distress. Head: Normocephalic, atraumatic.  Neck: Supple without bruits, JVD not elevated. Lungs:  Resp regular and unlabored, CTA bilaterally except a few rales bases. Heart: RRR, S1, S2, no S3, S4, or murmur. Abdomen: Soft, non-tender, non-distended, BS + x 4.  Extremities: No clubbing, cyanosis, no edema.  Neuro: Alert and oriented X 3. Moves all extremities spontaneously. Psych: Normal affect.  LABS: CBC: Basename 12/07/11 0458 12/06/11 2000  WBC 7.7 8.0  NEUTROABS -- 4.3  HGB 15.1 16.1  HCT 42.9 45.2  MCV 85.0 85.4  PLT 178 194   INR: Basename 12/07/11 0458  INR 0.99   Basic Metabolic Panel: Basename 12/07/11 0458 12/06/11 2000  NA 138 139  K 4.0 4.5  CL 104 103  CO2 27 26  GLUCOSE 97 86  BUN 12 11  CREATININE 1.33 1.34  CALCIUM 9.4 10.0  MG -- --  PHOS -- --   Cardiac Enzymes: Basename 12/07/11 0458 12/06/11 2355  CKTOTAL 167 204  CKMB 3.6 4.6*  CKMBINDEX -- --  TROPONINI <0.30 <0.30   BNP: Pro B Natriuretic peptide (BNP)  Date/Time Value Range Status  12/06/2011 11:55 PM 41.6  0-125 (pg/mL) Final   Fasting Lipid Panel: Basename 12/07/11 0458  CHOL 206*  HDL 35*    LDLCALC 141*  TRIG 150*  CHOLHDL 5.9  LDLDIRECT --    TELE:   SR    Radiology/Studies: Dg Chest 2 View 12/06/2011  *RADIOLOGY REPORT*  Clinical Data: Chest pain and shortness of breath.  CHEST - 2 VIEW  Comparison: None.  Findings: The lungs are clear without focal infiltrate, edema, pneumothorax or pleural effusion. Interstitial markings are diffusely coarsened with chronic features. Cardiopericardial silhouette is at upper limits of normal for size. Imaged bony structures of the thorax are intact.  IMPRESSION: Underlying chronic interstitial changes.  No acute cardiopulmonary findings.  Original Report Authenticated By: ERIC A. MANSELL, M.D.    Current Medications:    . aspirin  324 mg Oral Once  . aspirin  324 mg Oral Pre-Cath  . aspirin EC  81 mg Oral Daily  . diazepam  5 mg Oral On Call  . heparin      . metoprolol tartrate  25 mg Oral BID  . nitroGLYCERIN  5 mcg/min Intravenous Once  . simvastatin  40 mg Oral q1800  . sodium chloride  3 mL Intravenous Q12H  . DISCONTD: heparin  4,000 Units Intravenous Once      . sodium chloride 75 mL/hr at 12/06/11 2359  .   sodium chloride    . heparin 1,300 Units/hr (12/06/11 2233)    ASSESSMENT AND PLAN: Active Problems:  Unstable angina - pain-free on current Rx, cath today. The risks and benefits of a cardiac catheterization including, but not limited to, death, stroke, MI, kidney damage and bleeding were discussed with the patient who indicates understanding and agrees to proceed. He wants to talk it over with his wife but has signed the permit.   Hyperlipidemia - simva 40 is new, diet changes as well.  Obesity - borderline with BMI 29.6 - diet changes.  Signed, Rhonda Barrett , PA-C 8:05 AM 12/07/2011  I have personally seen and examined this patient with Rhonda Barrett, PA-C.  I agree with the assessment and plan as outlined above. Will plan cath today.   Gwenyth Dingee 9:49 AM' 12/07/2011   

## 2011-12-07 NOTE — Progress Notes (Signed)
Patient ID: Michael Spears, male   DOB: 09-Dec-1955, 56 y.o.   MRN: [id number]                    [address]            [place] [address]          [phone]       [facility] Record #[id number] Date of Birth: March 28, 1956  Referring:  [facility] Primary [facility]: Carrie Mew, MD, MD  Chief Complaint:    Chief Complaint  Patient presents with  . Chest Pain    History of Present Illness:     Michael Spears is a 55 y.o. male who presents for evaluation of chest pain. He reports these symptoms for past 4 days. Substernal severrer chest pain lasting 30 min. Pain was described as harsh intense, left sided pain that lasted around . Pain was 8/10 with associated nausea and SOB. He felt slightly fatigued on Friday but was able to work. He then reported chest pain with exertion on sat and sun while doing yard work. Pain would be relieved by rest. He went to clinic today and was urged to come to ED for further evaluation. He is currently chest pain free at this time. No previous history of MI or CAD. Previous smoker but quit 8 years ago, no DM    Current Activity/ Functional Status: Patient is independent with mobility/ambulation, transfers, ADL's, IADL's.   History reviewed. No pertinent past medical history.  History reviewed. No pertinent past surgical history.  History  Smoking status  . Former Smoker -- 0.5 packs/day for 25 years  . Types: Cigarettes  . Quit date: 12/07/2003  Smokeless tobacco  . Not on file    History  Alcohol Use No    History   Social History  . Marital Status: Married    Spouse Name: N/A    Number of Children: N/A  . Years of Education: N/A   Occupational History  . 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 Smoking status: Former Smoker -- 0.5 packs/day for 25 years    Types: Cigarettes    Quit date: 12/07/2003  . Smokeless tobacco: Not on file  .  Alcohol Use: No  . Drug Use: No  . Sexually Active: Yes   Other Topics Concern  . Not on file   Social History Narrative  . No narrative on file    No Known Allergies  Current Facility-Administered Medications  Medication Dose Route Frequency Provider Last Rate Last Dose  . 0.9 %  sodium chloride infusion   Intravenous Continuous [name], MD 75 mL/hr at 12/07/11 1630    . acetaminophen (TYLENOL) tablet 650 mg  650 mg Oral Q4H PRN Pelbreton C. Terressa Koyanagi, MD   650 mg at 12/06/11 2359  . aspirin chewable tablet 324 mg  324 mg Oral Once Dorthula Matas, PA   324 mg at 12/06/11 2053  . aspirin chewable tablet 324 mg  324 mg Oral Pre-Cath [name], MD   324 mg at 12/07/11 1142  . aspirin EC tablet 81 mg  81 mg Oral Daily Pelbreton C. Terressa Koyanagi, MD   81 mg at 12/07/11 0954  . diazepam (VALIUM) tablet 5 mg  5 mg Oral On Call [name], MD   5 mg at 12/07/11 1144  . fentaNYL (SUBLIMAZE) 0.05 MG/ML injection           .  heparin 1000 UNIT/ML injection           . heparin 2-0.9 UNIT/ML-% infusion           . heparin 5000 UNIT/ML injection        4,000 Units at 12/06/11 2211  . heparin ADULT infusion 100 units/mL (25000 units/250 mL)  1,300 Units/hr Intravenous Continuous Severiano Gilbert, PHARMD      . lidocaine (XYLOCAINE) 1 % injection           . metoprolol tartrate (LOPRESSOR) tablet 25 mg  25 mg Oral BID Pelbreton C. Terressa Koyanagi, MD   25 mg at 12/07/11 0954  . midazolam (VERSED) 2 MG/2ML injection           . nitroGLYCERIN (NITROSTAT) SL tablet 0.4 mg  0.4 mg Sublingual Q5 Min x 3 PRN Pelbreton C. Balfour, MD      . nitroGLYCERIN (NTG ON-CALL) 0.2 mg/mL injection           . nitroGLYCERIN 0.2 mg/mL in dextrose 5 % infusion  5 mcg/min Intravenous Once Dorthula Matas, PA 1.5 mL/hr at 12/06/11 2107 5 mcg/min at 12/06/11 2107  . ondansetron (ZOFRAN) injection 4 mg  4 mg Intravenous Q6H PRN Pelbreton C. Balfour, MD      . simvastatin (ZOCOR) tablet 40 mg  40  mg Oral q1800 Pelbreton C. Terressa Koyanagi, MD   40 mg at 12/07/11 1726    Prescriptions prior to admission  Medication Sig Dispense Refill  . Acetaminophen-Aspirin Buffered (EXCEDRIN BACK & BODY PO) Take 2 tablets by mouth See admin instructions. Takes 2 tabs twice daily every other day      . metoprolol succinate (TOPROL-XL) 50 MG 24 hr tablet Take 50 mg by mouth daily. Take with or immediately following a meal.        History reviewed. No pertinent family history. mother and father alive without history of CAD   Review of Systems:     Cardiac Review of Systems: Y or N  Chest Pain [  y]  Resting SOB [  y ] Exertional SOB  Cove.Etienne  ]  Orthopnea [  ]   Pedal Edema [   n    Palpitations [n  ] Syncope  [ n ]   Presyncope [   ]  General Review of Systems: [Y] = yes [  ]=no Constitional: recent weight change [  ]; anorexia [  ]; fatigue [ y ]; nausea [  ]; night sweats [  ]; fever [  ]; or chills [  ];                                                                                                                                          Dental: poor dentition[ n ];   Eye : blurred vision [  ]; diplopia [   ]; vision changes [  ];  Amaurosis  fugax[  ]; Resp: cough [  ];  wheezing[  ];  hemoptysis[  ]; shortness of breath[  ]; paroxysmal nocturnal dyspnea[  ]; dyspnea on exertion[  ]; or orthopnea[  ];  GI:  gallstones[  ], vomiting[  ];  dysphagia[  ]; melena[  ];  hematochezia [  ]; heartburn[  ];   Hx of  Colonoscopy[  ]; GU: kidney stones [  ]; hematuria[  ];   dysuria [ y ];  nocturia[  ];  history of     obstruction [  ];             Skin: rash, swelling[  ];, hair loss[  ];  peripheral edema[  ];  or itching[  ]; Musculosketetal: myalgias[  ];  joint swelling[  ];  joint erythema[  ];  joint pain[  ];  back pain[  ];  Heme/Lymph: bruising[  ];  bleeding[  ];  anemia[  ];  Neuro: TIA[  ];  headaches[  ];  stroke[  ];  vertigo[  ];  seizures[  ];   paresthesias[  ];  difficulty walking[   ];  Psych:depression[  ]; anxiety[  ];  Endocrine: diabetes[  ];  thyroid dysfunction[  ];  Immunizations: Flu Milo.Brash ]; Pneumococcal[ n ];  Other:  Physical Exam: BP 144/80  Pulse 81  Temp(Src) 97.5 F (36.4 C) (Oral)  Resp 18  Ht 5\' 10"  (1.778 m)  Wt 205 lb 11 oz (93.3 kg)  BMI 29.51 kg/m2  SpO2 94%  General appearance: alert, cooperative, appears stated age and no distress Neurologic: intact Heart: regular rate and rhythm, S1, S2 normal, no murmur, click, rub or gallop and normal apical impulse Lungs: clear to auscultation bilaterally and normal percussion bilaterally Abdomen: soft, non-tender; bowel sounds normal; no masses,  no organomegaly Extremities: extremities normal, atraumatic, no cyanosis or edema, Homans sign is negative, no sign of DVT and rt wrist cath site intact no carotid briut   Diagnostic Studies & Laboratory data:     Recent Radiology Findings:   Dg Chest 2 View  12/06/2011  *RADIOLOGY REPORT*  Clinical Data: Chest pain and shortness of breath.  CHEST - 2 VIEW  Comparison: None.  Findings: The lungs are clear without focal infiltrate, edema, pneumothorax or pleural effusion. Interstitial markings are diffusely coarsened with chronic features. Cardiopericardial silhouette is at upper limits of normal for size. Imaged bony structures of the thorax are intact.  IMPRESSION: Underlying chronic interstitial changes.  No acute cardiopulmonary findings.  Original Report Authenticated By: ERIC A. MANSELL, M.D.      Recent Lab Findings: Lab Results  Component Value Date   WBC 7.7 12/07/2011   HGB 15.1 12/07/2011   HCT 42.9 12/07/2011   PLT 178 12/07/2011   GLUCOSE 97 12/07/2011   CHOL 206* 12/07/2011   TRIG 150* 12/07/2011   HDL 35* 12/07/2011   LDLCALC 141* 12/07/2011   NA 138 12/07/2011   K 4.0 12/07/2011   CL 104 12/07/2011   CREATININE 1.33 12/07/2011   BUN 12 12/07/2011   CO2 27 12/07/2011   TSH 3.689 12/06/2011   INR 0.99 12/07/2011   HGBA1C 5.9* 12/06/2011   Lab Results  Component  Value Date   CKTOTAL 152 12/07/2011   CKMB 3.0 12/07/2011   TROPONINI <0.30 12/07/2011   Cath:  Procedure Performed:  1. Left Heart Catheterization 2. Selective Coronary Angiography 3. Left ventricular angiogram Operator: Verne Carrow, MD  Arterial access site: Right radial artery.  Indication: Unstable angina. Negative cardiac enzymes.  Procedure Details:  The risks, benefits, complications, treatment options, and expected outcomes were discussed with the patient. The patient and/or family concurred with the proposed plan, giving informed consent. The patient was brought to the cath lab after IV hydration was begun and oral premedication was given. The patient was further sedated with Versed and Fentanyl. The right wrist was assessed with an Allens test which was positive. The right wrist was prepped and draped in a sterile fashion. 1% lidocaine was used for local anesthesia. Using the modified Seldinger access technique, a 5 French sheath was placed in the right radial artery. 1.25 mg Nicardipine was given through the sheath. 5000 units IV heparin was given. Standard diagnostic catheters were used to perform selective coronary angiography. A pigtail catheter was used to perform a left ventricular angiogram. The sheath was removed from the right radial artery and a Terumo hemostasis band was applied at the arteriotomy site on the right wrist.  There were no immediate complications. The patient was taken to the recovery area in stable condition.  Hemodynamic Findings:  Central aortic pressure: 95/62  Left ventricular pressure: 97/2/11  Angiographic Findings:  Left main: No obstructive disease.  Left Anterior Descending Artery: Large caliber vessel that courses to the apex. The proximal vessel has diffuse 40% stenosis. The mid vessel has 100% occlusion. The distal vessel fills slowly in an antegrade fashion and is seen to fill also from right to left collaterals. Moderate sized diagonal branch  with 40% stenosis.  Circumflex Artery: Moderate sized vessel with 70% mid stenosis. The first OM branch is small to moderate sized with 99% mid stenosis. The second OM branch is moderate sized with proximal 70-80% stenosis. The distal portion of OM2 bifurcates and has diffuse disease.  Right Coronary Artery: Large, dominant vessel with diffuse 50% plaque throughout the proximal and mid segment. The mid vessel has a 100% stenosis. The distal vessel, PDA and PLA fills from right to right and left to right collaterals. The apical LAD is also seen to fill via collaterals from the RCA.  Left Ventricular Angiogram: LVEF 60-65%.  Impression:  1. Severe triple vessel CAD  2. Preserved LV systolic function  3. Unstable angina  Recommendations: Will admit to telemetry. Start IV heparin 6 hours after sheath pull. Beta blocker, statin, ASA. Will call CT surgery for possible CABG.    Assessment / Plan:   New Onset unstable angina, without elevated CKMB/Troponini 3  vessel coronary artery disease I agree with Dr. [name] and coronary artery bypass grafting offers the best treatment with new onset of angina and significant three-vessel coronary artery disease. I discussed this recommendation with the patient and his family in detail including the risks of surgery and other options. Her questions have been answered. I have made arrangements to changes scheduled for Friday to proceed with coronary artery bypass grafting on this admission Friday morning.  The goals risks and alternatives of the planned surgical procedure CABG  have been discussed with the patient in detail. The risks of the procedure including death, infection, stroke, myocardial infarction, bleeding, blood transfusion have all been discussed specifically.  I have quoted Francena Hanly a 2 % of perioperative mortality and a complication rate as high as 15%. The patient's questions have been answered.Michael Spears is willing  to proceed with the  planned procedure.         Delight Ovens MD  Beeper [phone] Office [phone] 12/07/2011 6:09 PM

## 2011-12-07 NOTE — Progress Notes (Signed)
ANTICOAGULATION CONSULT NOTE   Pharmacy Consult for Heparin Indication: chest pain/ACS  No Known Allergies  Patient Measurements: Height: 5\' 10"  (177.8 cm) Weight: 205 lb 11 oz (93.3 kg) IBW/kg (Calculated) : 73  Heparin Dosing Weight: 92 kg  Vital Signs: Pulse Rate: 79  (05/07 1209)  Labs:  Basename 12/07/11 1150 12/07/11 0458 12/06/11 2355 12/06/11 2000  HGB -- 15.1 -- 16.1  HCT -- 42.9 -- 45.2  PLT -- 178 -- 194  APTT -- -- -- --  LABPROT -- 13.3 -- --  INR -- 0.99 -- --  HEPARINUNFRC -- 0.43 -- --  CREATININE -- 1.33 -- 1.34  CKTOTAL 152 167 204 --  CKMB 3.0 3.6 4.6* --  TROPONINI <0.30 <0.30 <0.30 --   Estimated Creatinine Clearance: 71.1 ml/min (by C-G formula based on Cr of 1.33).  Assessment:  56 yo male with chest pain. Now s/p cath found to have 3v CAD. Orders to resume heparin 6 hours post sheath pull(~1300). CVTS consult pending  Goal of Therapy:  Heparin level 0.3-0.7 units/ml   Plan:  Restart Heparin at previous rate  6 hour heparin level then daily  Sheppard Coil PharmD, BCPS   12/07/2011,3:46 PM

## 2011-12-07 NOTE — Plan of Care (Signed)
Problem: Limited Adherence to Nutrition-Related Recommendations (NB-1.6) Goal: Nutrition education Formal process to instruct or train a patient/client in a skill or to impart knowledge to help patients/clients voluntarily manage or modify food choices and eating behavior to maintain or improve health.  Outcome: Completed/Met Date Met:  12/07/11 RD consult for High Cholesterol/Weight Loss diet education. Reviewed guidelines and recommendations. Handouts provided from [employer]. Expect fair compliance. BMI = 29.6 kg/m2. Currently on Clear Liquids for cardiac catheterization today; reports a good appetite. No further nutrition intervention warranted at this time. Please consult RD as needed.  Alger Memos, RD, LDN Pager #: [phone]

## 2011-12-07 NOTE — Progress Notes (Signed)
ANTICOAGULATION CONSULT NOTE   Pharmacy Consult for Heparin Indication: chest pain/ACS  No Known Allergies  Patient Measurements: Height: 5\' 10"  (177.8 cm) Weight: 205 lb 11 oz (93.3 kg) IBW/kg (Calculated) : 73  Heparin Dosing Weight: 92 kg  Vital Signs: Temp: 98.7 F (37.1 C) (05/07 0346) Temp src: Oral (05/07 0346) BP: 131/79 mmHg (05/07 0346) Pulse Rate: 78  (05/07 0346)  Labs:  Basename 12/07/11 0458 12/06/11 2355 12/06/11 2000  HGB 15.1 -- 16.1  HCT 42.9 -- 45.2  PLT 178 -- 194  APTT -- -- --  LABPROT 13.3 -- --  INR 0.99 -- --  HEPARINUNFRC 0.43 -- --  CREATININE -- -- 1.34  CKTOTAL PENDING 204 --  CKMB 3.6 4.6* --  TROPONINI <0.30 <0.30 --   Estimated Creatinine Clearance: 70.6 ml/min (by C-G formula based on Cr of 1.34).  Assessment:  56 yo male with chest pain for Heparin  Goal of Therapy:  Heparin level 0.3-0.7 units/ml   Plan:  Continue Heparin at current rate  F/U after cath  Geannie Risen, PharmD, BCPS   12/07/2011,6:14 AM

## 2011-12-07 NOTE — Progress Notes (Addendum)
VASCULAR LAB PRELIMINARY  PRELIMINARY  PRELIMINARY  PRELIMINARY  Pre-op Cardiac Surgery  Carotid Findings:  No evidence of significant ICA stenosis.  Vertebral artery flow is antegrade.    Upper Extremity Right Left  Brachial Pressures 109 Triphasic 115 Triphasic  Radial Waveforms Triphasic Triphasic  Ulnar Waveforms Triphasic Triphasic  Palmar Arch (Allen's Test) Normal Normal   Findings:  Doppler waveforms remained normal bilaterally with both radial and ulnar compressions    Lower  Extremity Right Left  Dorsalis Pedis    Anterior Tibial    Posterior Tibial    Ankle/Brachial Indices      Findings:  Palpable pedal pulses x 4.     Smiley Houseman, 12/07/2011, 4:18 PM  Glenora Morocho, [place] D, RVS 02/07/2012, 12:35 PM

## 2011-12-07 NOTE — Progress Notes (Signed)
UR Completed. Simmons, Raeleigh Guinn F [phone]  

## 2011-12-07 NOTE — CV Procedure (Signed)
    Cardiac Catheterization Operative Report  JAVORIS STAR [id number] 5/7/201312:41 PM Carrie Mew, MD, MD  Procedure Performed:  1. Left Heart Catheterization 2. Selective Coronary Angiography 3. Left ventricular angiogram  Operator: Verne Carrow, MD  Arterial access site:  Right radial artery.   Indication:  Unstable angina. Negative cardiac enzymes.                                   Procedure Details: The risks, benefits, complications, treatment options, and expected outcomes were discussed with the patient. The patient and/or family concurred with the proposed plan, giving informed consent. The patient was brought to the cath lab after IV hydration was begun and oral premedication was given. The patient was further sedated with Versed and Fentanyl. The right wrist was assessed with an Allens test which was positive. The right wrist was prepped and draped in a sterile fashion. 1% lidocaine was used for local anesthesia. Using the modified Seldinger access technique, a 5 French sheath was placed in the right radial artery. 1.25 mg Nicardipine was given through the sheath. 5000 units IV heparin was given. Standard diagnostic catheters were used to perform selective coronary angiography. A pigtail catheter was used to perform a left ventricular angiogram. The sheath was removed from the right radial artery and a Terumo hemostasis band was applied at the arteriotomy site on the right wrist.    There were no immediate complications. The patient was taken to the recovery area in stable condition.   Hemodynamic Findings: Central aortic pressure: 95/62 Left ventricular pressure: 97/2/11  Angiographic Findings:  Left main:  No obstructive disease.  Left Anterior Descending Artery: Large caliber vessel that courses to the apex. The proximal vessel has diffuse 40% stenosis. The mid vessel has 100% occlusion. The distal vessel fills slowly in an antegrade fashion and is seen  to fill also from right to left collaterals. Moderate sized diagonal branch with 40% stenosis.   Circumflex Artery: Moderate sized vessel with 70% mid stenosis. The first OM branch is small to moderate sized with 99% mid stenosis. The second OM branch is moderate sized with proximal 70-80% stenosis. The distal portion of OM2 bifurcates and has diffuse disease.   Right Coronary Artery: Large, dominant vessel with diffuse 50% plaque throughout the proximal and mid segment. The mid vessel has a 100% stenosis. The distal vessel, PDA and PLA fills from right to right and left to right collaterals. The apical LAD is also seen to fill via collaterals from the RCA.   Left Ventricular Angiogram: LVEF 60-65%.   Impression: 1. Severe triple vessel CAD 2. Preserved LV systolic function 3. Unstable angina  Recommendations: Will admit to telemetry. Start IV heparin 6 hours after sheath pull. Beta blocker, statin, ASA. Will call CT surgery for possible CABG.        Complications:  None. The patient tolerated the procedure well.

## 2011-12-07 NOTE — ED Provider Notes (Signed)
56 year old man has had chest pain on and off for the past 3 days.  No prior history of heart trouble.  Onset of pain 3 nights ago with leftsided chest pain rated at an 8 on the pain scale; this lasted 30 minutes and subsided without treatment.  The past two days he has felt short of breath and would develop chest pain with any activity, pain rated at a 4.  The pain would stop with rest.  No pain today, but felt short of breath.  Exam shows lungs clear, heart sounds normal.  EKG non-acute, but troponin I was elevated at 0.1.  Call to Dr. Terressa Koyanagi, on call for cardiology, who admitted pt for NSTEMI.   Carleene Cooper III, MD 12/07/11 1048

## 2011-12-07 NOTE — Interval H&P Note (Signed)
History and Physical Interval Note:  12/07/2011 11:55 AM  Francena Hanly  has presented today for surgery, with the diagnosis of chest pain  The various methods of treatment have been discussed with the patient and family. After consideration of risks, benefits and other options for treatment, the patient has consented to  Procedure(s) (LRB): LEFT HEART CATHETERIZATION WITH CORONARY ANGIOGRAM (N/A) as a surgical intervention .  The patients' history has been reviewed, patient examined, no change in status, stable for surgery.  I have reviewed the patients' chart and labs.  Questions were answered to the patient's satisfaction.     Meiko Ives

## 2011-12-07 NOTE — H&P (View-Only) (Signed)
Patient Name: Michael Spears Date of Encounter: 12/07/2011  Active Problems:  Unstable angina    SUBJECTIVE: No chest pain since admission, never had at rest, all symptoms PTA were exertional. No SOB  OBJECTIVE Filed Vitals:   12/06/11 2152 12/06/11 2321 12/06/11 2328 12/07/11 0346  BP: 138/80  127/79 131/79  Pulse:   77 78  Temp:   97.8 F (36.6 C) 98.7 F (37.1 C)  TempSrc:   Oral Oral  Resp:   16 12  Height: 5\' 10"  (1.778 m)  5\' 10"  (1.778 m)   Weight: 203 lb (92.08 kg)  205 lb 11 oz (93.3 kg)   SpO2:  93% 93% 94%   No intake or output data in the 24 hours ending 12/07/11 0805 Weight change:  Filed Weights   12/06/11 2152 12/06/11 2328  Weight: 203 lb (92.08 kg) 205 lb 11 oz (93.3 kg)     PHYSICAL EXAM General: Well developed, well nourished, male in no acute distress. Head: Normocephalic, atraumatic.  Neck: Supple without bruits, JVD not elevated. Lungs:  Resp regular and unlabored, CTA bilaterally except a few rales bases. Heart: RRR, S1, S2, no S3, S4, or murmur. Abdomen: Soft, non-tender, non-distended, BS + x 4.  Extremities: No clubbing, cyanosis, no edema.  Neuro: Alert and oriented X 3. Moves all extremities spontaneously. Psych: Normal affect.  LABS: CBC: Basename 12/07/11 0458 12/06/11 2000  WBC 7.7 8.0  NEUTROABS -- 4.3  HGB 15.1 16.1  HCT 42.9 45.2  MCV 85.0 85.4  PLT 178 194   INR: Basename 12/07/11 0458  INR 0.99   Basic Metabolic Panel: Basename 12/07/11 0458 12/06/11 2000  NA 138 139  K 4.0 4.5  CL 104 103  CO2 27 26  GLUCOSE 97 86  BUN 12 11  CREATININE 1.33 1.34  CALCIUM 9.4 10.0  MG -- --  PHOS -- --   Cardiac Enzymes: Basename 12/07/11 0458 12/06/11 2355  CKTOTAL 167 204  CKMB 3.6 4.6*  CKMBINDEX -- --  TROPONINI <0.30 <0.30   BNP: Pro B Natriuretic peptide (BNP)  Date/Time Value Range Status  12/06/2011 11:55 PM 41.6  0-125 (pg/mL) Final   Fasting Lipid Panel: Basename 12/07/11 0458  CHOL 206*  HDL 35*    LDLCALC 141*  TRIG 150*  CHOLHDL 5.9  LDLDIRECT --    TELE:   SR    Radiology/Studies: Dg Chest 2 View 12/06/2011  *RADIOLOGY REPORT*  Clinical Data: Chest pain and shortness of breath.  CHEST - 2 VIEW  Comparison: None.  Findings: The lungs are clear without focal infiltrate, edema, pneumothorax or pleural effusion. Interstitial markings are diffusely coarsened with chronic features. Cardiopericardial silhouette is at upper limits of normal for size. Imaged bony structures of the thorax are intact.  IMPRESSION: Underlying chronic interstitial changes.  No acute cardiopulmonary findings.  Original Report Authenticated By: ERIC A. MANSELL, M.D.    Current Medications:    . aspirin  324 mg Oral Once  . aspirin  324 mg Oral Pre-Cath  . aspirin EC  81 mg Oral Daily  . diazepam  5 mg Oral On Call  . heparin      . metoprolol tartrate  25 mg Oral BID  . nitroGLYCERIN  5 mcg/min Intravenous Once  . simvastatin  40 mg Oral q1800  . sodium chloride  3 mL Intravenous Q12H  . DISCONTD: heparin  4,000 Units Intravenous Once      . sodium chloride 75 mL/hr at 12/06/11 2359  .  sodium chloride    . heparin 1,300 Units/hr (12/06/11 2233)    ASSESSMENT AND PLAN: Active Problems:  Unstable angina - pain-free on current Rx, cath today. The risks and benefits of a cardiac catheterization including, but not limited to, death, stroke, MI, kidney damage and bleeding were discussed with the patient who indicates understanding and agrees to proceed. He wants to talk it over with his wife but has signed the permit.   Hyperlipidemia - simva 40 is new, diet changes as well.  Obesity - borderline with BMI 29.6 - diet changes.  Signed, Michael Spears , PA-C 8:05 AM 12/07/2011  I have personally seen and examined this patient with Michael Demark, PA-C.  I agree with the assessment and plan as outlined above. Will plan cath today.   Michael Spears 9:49 AM' 12/07/2011

## 2011-12-08 ENCOUNTER — Inpatient Hospital Stay (HOSPITAL_COMMUNITY): Payer: PRIVATE HEALTH INSURANCE

## 2011-12-08 DIAGNOSIS — Z01810 Encounter for preprocedural cardiovascular examination: Secondary | ICD-10-CM

## 2011-12-08 DIAGNOSIS — I200 Unstable angina: Secondary | ICD-10-CM

## 2011-12-08 LAB — CBC
MCHC: 35.2 g/dL (ref 30.0–36.0)
Platelets: 174 10*3/uL (ref 150–400)
RDW: 12.9 % (ref 11.5–15.5)
WBC: 7.3 10*3/uL (ref 4.0–10.5)

## 2011-12-08 MED FILL — Nicardipine HCl IV Soln 2.5 MG/ML: INTRAVENOUS | Qty: 1 | Status: AC

## 2011-12-08 NOTE — Progress Notes (Signed)
ANTICOAGULATION CONSULT NOTE   Pharmacy Consult for Heparin Indication: chest pain/ACS  No Known Allergies  Patient Measurements: Height: 5\' 10"  (177.8 cm) Weight: 205 lb 11 oz (93.3 kg) IBW/kg (Calculated) : 73  Heparin Dosing Weight: 92 kg  Vital Signs: Temp: 98 F (36.7 C) (05/07 2115) Temp src: Oral (05/07 2115) BP: 154/80 mmHg (05/07 2115) Pulse Rate: 89  (05/07 2115)  Labs:  Basename 12/08/11 0043 12/07/11 1150 12/07/11 0458 12/06/11 2355 12/06/11 2000  HGB 14.9 -- 15.1 -- --  HCT 42.3 -- 42.9 -- 45.2  PLT 174 -- 178 -- 194  APTT -- -- -- -- --  LABPROT -- -- 13.3 -- --  INR -- -- 0.99 -- --  HEPARINUNFRC 0.30 -- 0.43 -- --  CREATININE -- -- 1.33 -- 1.34  CKTOTAL -- 152 167 204 --  CKMB -- 3.0 3.6 4.6* --  TROPONINI -- <0.30 <0.30 <0.30 --   Estimated Creatinine Clearance: 71.1 ml/min (by C-G formula based on Cr of 1.33).  Assessment: 56 yo male with 3v CAD, awaiting CABG, for heparin.   Heparin level 0.3-0.7 units/ml   Plan:  Continue Heparin at current rate   Geannie Risen, PharmD, BCPS   12/08/2011,1:56 AM

## 2011-12-08 NOTE — Progress Notes (Signed)
Patient Name: Michael Spears Date of Encounter: 12/08/2011  Active Problems:  Unstable angina  Hyperlipidemia    SUBJECTIVE: No chest pain. Cardiac cath showed severe 3 vessel CAD. CABG is scheduled on Friday.   OBJECTIVE Filed Vitals:   12/07/11 1550 12/07/11 2115 12/08/11 0510 12/08/11 1343  BP: 144/80 154/80 129/85 124/68  Pulse: 81 89 72 78  Temp: 97.5 F (36.4 C) 98 F (36.7 C) 97.8 F (36.6 C) 98.9 F (37.2 C)  TempSrc: Oral Oral Oral Oral  Resp: 18 18 18 18   Height:      Weight:   91 kg (200 lb 9.9 oz)   SpO2: 94% 94% 93% 96%    Intake/Output Summary (Last 24 hours) at 12/08/11 1759 Last data filed at 12/08/11 1700  Gross per 24 hour  Intake    720 ml  Output   2150 ml  Net  -1430 ml   Weight change: -1.08 kg (-2 lb 6.1 oz) Filed Weights   12/06/11 2152 12/06/11 2328 12/08/11 0510  Weight: 92.08 kg (203 lb) 93.3 kg (205 lb 11 oz) 91 kg (200 lb 9.9 oz)     PHYSICAL EXAM General: Well developed, well nourished, male in no acute distress. Head: Normocephalic, atraumatic.  Neck: Supple without bruits, JVD not elevated. Lungs:  Resp regular and unlabored, CTA bilaterally except a few rales bases. Heart: RRR, S1, S2, no S3, S4, or murmur. Abdomen: Soft, non-tender, non-distended, BS + x 4.  Extremities: No clubbing, cyanosis, no edema.  Neuro: Alert and oriented X 3. Moves all extremities spontaneously. Psych: Normal affect. R Radial: normal pulse. No hematoma   LABS: CBC:  Basename 12/08/11 0043 12/07/11 0458 12/06/11 2000  WBC 7.3 7.7 --  NEUTROABS -- -- 4.3  HGB 14.9 15.1 --  HCT 42.3 42.9 --  MCV 86.0 85.0 --  PLT 174 178 --   INR:  Basename 12/07/11 0458  INR 0.99   Basic Metabolic Panel:  Basename 12/07/11 0458 12/06/11 2000  NA 138 139  K 4.0 4.5  CL 104 103  CO2 27 26  GLUCOSE 97 86  BUN 12 11  CREATININE 1.33 1.34  CALCIUM 9.4 10.0  MG -- --  PHOS -- --   Cardiac Enzymes:  Basename 12/07/11 1150 12/07/11 0458 12/06/11  2355  CKTOTAL 152 167 204  CKMB 3.0 3.6 4.6*  CKMBINDEX -- -- --  TROPONINI <0.30 <0.30 <0.30   BNP: Pro B Natriuretic peptide (BNP)  Date/Time Value Range Status  12/06/2011 11:55 PM 41.6  0-125 (pg/mL) Final   Fasting Lipid Panel:  Basename 12/07/11 0458  CHOL 206*  HDL 35*  LDLCALC 141*  TRIG 150*  CHOLHDL 5.9  LDLDIRECT --    TELE:   SR    Radiology/Studies: Dg Chest 2 View 12/06/2011  *RADIOLOGY REPORT*  Clinical Data: Chest pain and shortness of breath.  CHEST - 2 VIEW  Comparison: None.  Findings: The lungs are clear without focal infiltrate, edema, pneumothorax or pleural effusion. Interstitial markings are diffusely coarsened with chronic features. Cardiopericardial silhouette is at upper limits of normal for size. Imaged bony structures of the thorax are intact.  IMPRESSION: Underlying chronic interstitial changes.  No acute cardiopulmonary findings.  Original Report Authenticated By: ERIC A. MANSELL, M.D.    Current Medications:     . aspirin EC  81 mg Oral Daily  . metoprolol tartrate  25 mg Oral BID  . simvastatin  40 mg Oral q1800      .  sodium chloride 75 mL/hr at 12/07/11 1630  . heparin 1,300 Units/hr (12/08/11 0018)    ASSESSMENT AND PLAN: Active Problems: 1.  Unstable angina - pain-free on current Rx, : Cardiac cath showed severe 3 vessel CAD. Continue Heparin until surgery which is planned on Friday.  Continue Metoprolol.   2. Hyperlipidemia - simva 40 is new, diet changes as well.  3. Obesity - borderline with BMI 29.6 - diet changes.  Eliane Decree , 5:59 PM 12/08/2011

## 2011-12-09 DIAGNOSIS — I214 Non-ST elevation (NSTEMI) myocardial infarction: Secondary | ICD-10-CM

## 2011-12-09 DIAGNOSIS — I2510 Atherosclerotic heart disease of native coronary artery without angina pectoris: Secondary | ICD-10-CM

## 2011-12-09 DIAGNOSIS — Z951 Presence of aortocoronary bypass graft: Secondary | ICD-10-CM

## 2011-12-09 LAB — CBC
Hemoglobin: 16 g/dL (ref 13.0–17.0)
MCH: 29.9 pg (ref 26.0–34.0)
Platelets: 180 10*3/uL (ref 150–400)
RBC: 5.35 MIL/uL (ref 4.22–5.81)
WBC: 7 10*3/uL (ref 4.0–10.5)

## 2011-12-09 LAB — TYPE AND SCREEN
ABO/RH(D): O POS
Antibody Screen: NEGATIVE

## 2011-12-09 LAB — HEPARIN LEVEL (UNFRACTIONATED): Heparin Unfractionated: 0.43 IU/mL (ref 0.30–0.70)

## 2011-12-09 MED ORDER — DOPAMINE-DEXTROSE 3.2-5 MG/ML-% IV SOLN
2.0000 ug/kg/min | INTRAVENOUS | Status: DC
  Filled 2011-12-09: qty 250

## 2011-12-09 MED ORDER — SODIUM BICARBONATE 8.4 % IV SOLN
INTRAVENOUS | Status: AC
  Administered 2011-12-10: 10:00:00
  Filled 2011-12-09: qty 2.5

## 2011-12-09 MED ORDER — POTASSIUM CHLORIDE 2 MEQ/ML IV SOLN
80.0000 meq | INTRAVENOUS | Status: DC
  Filled 2011-12-09: qty 40

## 2011-12-09 MED ORDER — DEXTROSE 5 % IV SOLN
1.5000 g | INTRAVENOUS | Status: AC
  Administered 2011-12-10: 1.5 g via INTRAVENOUS
  Administered 2011-12-10: 750 g via INTRAVENOUS
  Filled 2011-12-09: qty 1.5

## 2011-12-09 MED ORDER — DEXTROSE 5 % IV SOLN
0.5000 ug/min | INTRAVENOUS | Status: DC
  Filled 2011-12-09: qty 4

## 2011-12-09 MED ORDER — METOPROLOL TARTRATE 12.5 MG HALF TABLET
12.5000 mg | ORAL_TABLET | Freq: Once | ORAL | Status: AC
  Administered 2011-12-10: 12.5 mg via ORAL
  Filled 2011-12-09: qty 1

## 2011-12-09 MED ORDER — SODIUM CHLORIDE 0.9 % IV SOLN
0.1000 ug/kg/h | INTRAVENOUS | Status: AC
  Administered 2011-12-10: .2 ug/kg/h via INTRAVENOUS
  Filled 2011-12-09: qty 4

## 2011-12-09 MED ORDER — CHLORHEXIDINE GLUCONATE 4 % EX LIQD
60.0000 mL | Freq: Once | CUTANEOUS | Status: DC
  Filled 2011-12-09: qty 60

## 2011-12-09 MED ORDER — TRANEXAMIC ACID (OHS) BOLUS VIA INFUSION
15.0000 mg/kg | INTRAVENOUS | Status: AC
  Administered 2011-12-10: 1365 mg via INTRAVENOUS
  Filled 2011-12-09: qty 1365

## 2011-12-09 MED ORDER — METOPROLOL TARTRATE 12.5 MG HALF TABLET
12.5000 mg | ORAL_TABLET | Freq: Once | ORAL | Status: DC
  Filled 2011-12-09: qty 1

## 2011-12-09 MED ORDER — NITROGLYCERIN IN D5W 200-5 MCG/ML-% IV SOLN
2.0000 ug/min | INTRAVENOUS | Status: AC
  Administered 2011-12-10: 16 ug/min via INTRAVENOUS
  Filled 2011-12-09: qty 250

## 2011-12-09 MED ORDER — TRANEXAMIC ACID (OHS) PUMP PRIME SOLUTION
2.0000 mg/kg | INTRAVENOUS | Status: DC
  Filled 2011-12-09: qty 1.82

## 2011-12-09 MED ORDER — SODIUM CHLORIDE 0.9 % IV SOLN
1500.0000 mg | INTRAVENOUS | Status: AC
  Administered 2011-12-10: 1500 mg via INTRAVENOUS
  Filled 2011-12-09: qty 1500

## 2011-12-09 MED ORDER — PHENYLEPHRINE HCL 10 MG/ML IJ SOLN
30.0000 ug/min | INTRAVENOUS | Status: AC
  Administered 2011-12-10: 10 ug/min via INTRAVENOUS
  Filled 2011-12-09: qty 2

## 2011-12-09 MED ORDER — BISACODYL 5 MG PO TBEC
5.0000 mg | DELAYED_RELEASE_TABLET | Freq: Once | ORAL | Status: AC
  Administered 2011-12-09: 5 mg via ORAL
  Filled 2011-12-09: qty 1

## 2011-12-09 MED ORDER — MAGNESIUM SULFATE 50 % IJ SOLN
40.0000 meq | INTRAMUSCULAR | Status: DC
  Filled 2011-12-09: qty 10

## 2011-12-09 MED ORDER — DEXTROSE 5 % IV SOLN
750.0000 mg | INTRAVENOUS | Status: DC
  Filled 2011-12-09: qty 750

## 2011-12-09 MED ORDER — TRANEXAMIC ACID 100 MG/ML IV SOLN
1.5000 mg/kg/h | INTRAVENOUS | Status: AC
  Administered 2011-12-10: 1.5 mg/kg/h via INTRAVENOUS
  Filled 2011-12-09: qty 25

## 2011-12-09 MED ORDER — CHLORHEXIDINE GLUCONATE 4 % EX LIQD
60.0000 mL | Freq: Once | CUTANEOUS | Status: AC
  Administered 2011-12-10: 4 via TOPICAL
  Filled 2011-12-09: qty 60

## 2011-12-09 MED ORDER — SODIUM CHLORIDE 0.9 % IV SOLN
INTRAVENOUS | Status: AC
  Administered 2011-12-10: 1 [IU]/h via INTRAVENOUS
  Filled 2011-12-09: qty 1

## 2011-12-09 MED ORDER — CHLORHEXIDINE GLUCONATE 4 % EX LIQD
60.0000 mL | Freq: Once | CUTANEOUS | Status: AC
  Administered 2011-12-09: 4 via TOPICAL
  Filled 2011-12-09: qty 60

## 2011-12-09 MED ORDER — TEMAZEPAM 15 MG PO CAPS: 15.0000 mg | ORAL_CAPSULE | Freq: Once | ORAL | Status: AC | PRN

## 2011-12-09 MED ORDER — BISACODYL 5 MG PO TBEC: 5.0000 mg | DELAYED_RELEASE_TABLET | Freq: Once | ORAL | Status: DC

## 2011-12-09 NOTE — Care Management Note (Signed)
    Page 1 of 1   12/13/2011     2:36:58 PM   CARE MANAGEMENT NOTE 12/13/2011  Patient:  Michael Spears, Michael Spears   Account Number:  [id number]  Date Initiated:  12/09/2011  Documentation initiated by:  Alexia Dinger  Subjective/Objective Assessment:   PT WITH 3 VESSEL CAD SCHEDULED FOR CABG TOMORROW 5/10. PTA, PT INDEPENDENT, LIVES WITH 18 AND 57 YO CHILDREN.     Action/Plan:   MET WITH PT TO DISCUSS DC PLANS. PLANS TO DC HOME WITH CHILDREN; WILL FOLLOW FOR HOME NEEDS AS PT PROGRESSES.   Anticipated DC Date:  12/16/2011   Anticipated DC Plan:  HOME W HOME HEALTH SERVICES      DC Planning Services  CM consult      Choice offered to / List presented to:             Status of service:  In process, will continue to follow Medicare Important Message given?   (If response is "NO", the following Medicare IM given date fields will be blank) Date Medicare IM given:   Date Additional Medicare IM given:    Discharge Disposition:    Per UR Regulation:    If discussed at Long Length of Stay Meetings, dates discussed:    Comments:  12/13/11 Katisha Shimizu,RN,BSN 1425 PT S/P CABG ON 5/10; PROGRESSING WELL.   PT WILL DC HOME WITH SON AND DAUGHTER TO PROVIDE 24HR CARE.  PT DENIES ANY HOME NEEDS PRESENTLY.  WILL FOLLOW.

## 2011-12-09 NOTE — Progress Notes (Signed)
Patient ID: Michael Spears, male   DOB: [birth year], 56 y.o.   MRN: [id number]   SUBJECTIVE:  The patient feels well. He is ready for his planned surgery for tomorrow. He's not had any recurrent chest pain.   Filed Vitals:   12/08/11 0510 12/08/11 1343 12/08/11 1953 12/09/11 0403  BP: 129/85 124/68 133/72 119/81  Pulse: 72 78 83 78  Temp: 97.8 F (36.6 C) 98.9 F (37.2 C) 97.4 F (36.3 C) 97.4 F (36.3 C)  TempSrc: Oral Oral Oral Oral  Resp: 18 18 18 18   Height:      Weight: 200 lb 9.9 oz (91 kg)     SpO2: 93% 96% 97% 98%    Intake/Output Summary (Last 24 hours) at 12/09/11 0732 Last data filed at 12/08/11 1700  Gross per 24 hour  Intake    480 ml  Output   1500 ml  Net  -1020 ml    LABS: Basic Metabolic Panel:  Basename 12/07/11 0458 12/06/11 2000  NA 138 139  K 4.0 4.5  CL 104 103  CO2 27 26  GLUCOSE 97 86  BUN 12 11  CREATININE 1.33 1.34  CALCIUM 9.4 10.0  MG -- --  PHOS -- --   Liver Function Tests: No results found for this basename: AST:2,ALT:2,ALKPHOS:2,BILITOT:2,PROT:2,ALBUMIN:2 in the last 72 hours No results found for this basename: LIPASE:2,AMYLASE:2 in the last 72 hours CBC:  Basename 12/09/11 0545 12/08/11 0043 12/06/11 2000  WBC 7.0 7.3 --  NEUTROABS -- -- 4.3  HGB 16.0 14.9 --  HCT 46.5 42.3 --  MCV 86.9 86.0 --  PLT 180 174 --   Cardiac Enzymes:  Basename 12/07/11 1150 12/07/11 0458 12/06/11 2355  CKTOTAL 152 167 204  CKMB 3.0 3.6 4.6*  CKMBINDEX -- -- --  TROPONINI <0.30 <0.30 <0.30   BNP: No components found with this basename: POCBNP:3 D-Dimer: No results found for this basename: DDIMER:2 in the last 72 hours Hemoglobin A1C:  Basename 12/06/11 2355  HGBA1C 5.9*   Fasting Lipid Panel:  Basename 12/07/11 0458  CHOL 206*  HDL 35*  LDLCALC 141*  TRIG 150*  CHOLHDL 5.9  LDLDIRECT --   Thyroid Function Tests:  Basename 12/06/11 2355  TSH 3.689  T4TOTAL --  T3FREE --  THYROIDAB --    RADIOLOGY: Dg Chest 2  View  12/06/2011  *RADIOLOGY REPORT*  Clinical Data: Chest pain and shortness of breath.  CHEST - 2 VIEW  Comparison: None.  Findings: The lungs are clear without focal infiltrate, edema, pneumothorax or pleural effusion. Interstitial markings are diffusely coarsened with chronic features. Cardiopericardial silhouette is at upper limits of normal for size. Imaged bony structures of the thorax are intact.  IMPRESSION: Underlying chronic interstitial changes.  No acute cardiopulmonary findings.  Original Report Authenticated By: ERIC A. MANSELL, M.D.    PHYSICAL EXAM  Patient is oriented to person time and place. Affect is normal. Cardiac exam reveals S1 and S2. There no clicks or significant murmurs.   TELEMETRY: I have reviewed telemetry. There is normal sinus rhythm.   ASSESSMENT AND PLAN:   Unstable angina  Hyperlipidemia   CAD (coronary artery disease)   Patient has three-vessel coronary disease and is scheduled for CABG tomorrow. He is stable. No change in therapy.    Willa Rough 12/09/2011 7:32 AM

## 2011-12-09 NOTE — Progress Notes (Signed)
Patient ID: Michael Spears, male   DOB: July 02, 1956, 56 y.o.   MRN: [id number]                   [address]            [place] [address]          [phone]     2 Days Post-Op Procedure(s) (LRB): LEFT HEART CATHETERIZATION WITH CORONARY ANGIOGRAM (N/A)  LOS: 3 days   Subjective: For CABG tomorrow, no chest [apin   Objective: Vital signs in last 24 hours: Patient Vitals for the past 24 hrs:  BP Temp Temp src Pulse Resp SpO2  12/09/11 1306 130/79 mmHg 97.4 F (36.3 C) Oral 78  18  96 %  12/09/11 0403 119/81 mmHg 97.4 F (36.3 C) Oral 78  18  98 %  12/08/11 1953 133/72 mmHg 97.4 F (36.3 C) Oral 83  18  97 %    Filed Weights   12/06/11 2152 12/06/11 2328 12/08/11 0510  Weight: 203 lb (92.08 kg) 205 lb 11 oz (93.3 kg) 200 lb 9.9 oz (91 kg)    Hemodynamic parameters for last 24 hours:    Intake/Output from previous day: 05/08 0701 - 05/09 0700 In: 720 [P.O.:720] Out: 2150 [Urine:2150] Intake/Output this shift:    Scheduled Meds:   . aspirin EC  81 mg Oral Daily  . bisacodyl  5 mg Oral Once  . cefUROXime (ZINACEF)  IV  1.5 g Intravenous To OR  . cefUROXime (ZINACEF)  IV  750 mg Intravenous To OR  . chlorhexidine  60 mL Topical Once  . chlorhexidine  60 mL Topical Once  . chlorhexidine  60 mL Topical Once  . dexmedetomidine (PRECEDEX) IV infusion for high rates  0.1-0.7 mcg/kg/hr Intravenous To OR  . DOPamine  2-20 mcg/kg/min Intravenous To OR  . epinephrine  0.5-20 mcg/min Intravenous To OR  . insulin (NOVOLIN-R) infusion   Intravenous To OR  . magnesium sulfate  40 mEq Other To OR  . metoprolol tartrate  12.5 mg Oral Once  . metoprolol tartrate  25 mg Oral BID  . nitroGLYCERIN  2-200 mcg/min Intravenous To OR  . nitroglycerin-nicardipine-HEPARIN-sodium bicarbonate irrigation for artery spasm   Irrigation To OR  . phenylephrine (NEO-SYNEPHRINE) Adult infusion  30-200 mcg/min Intravenous To OR  . potassium chloride  80 mEq Other To OR  .  simvastatin  40 mg Oral q1800  . tranexamic acid  15 mg/kg Intravenous To OR  . tranexamic acid  2 mg/kg Intracatheter To OR  . tranexamic acid (CYKLOKAPRON) infusion ([facility])  1.5 mg/kg/hr Intravenous To OR  . vancomycin  1,500 mg Intravenous To OR  . DISCONTD: bisacodyl  5 mg Oral Once  . DISCONTD: chlorhexidine  60 mL Topical Once  . DISCONTD: chlorhexidine  60 mL Topical Once  . DISCONTD: chlorhexidine  60 mL Topical Once  . DISCONTD: metoprolol tartrate  12.5 mg Oral Once   Continuous Infusions:   . heparin 1,300 Units/hr (12/09/11 1540)   PRN Meds:.acetaminophen, nitroGLYCERIN, ondansetron (ZOFRAN) IV, temazepam  General appearance: alert, cooperative and no distress Heart: regular rate and rhythm, S1, S2 normal, no murmur, click, rub or gallop and normal apical impulse Lungs: clear to auscultation bilaterally  Lab Results: CBC: Basename 12/09/11 0545 12/08/11 0043  WBC 7.0 7.3  HGB 16.0 14.9  HCT 46.5 42.3  PLT 180 174   BMET:  Basename 12/07/11 0458 12/06/11 2000  NA 138 139  K 4.0  4.5  CL 104 103  CO2 27 26  GLUCOSE 97 86  BUN 12 11  CREATININE 1.33 1.34  CALCIUM 9.4 10.0    PT/INR:  Basename 12/07/11 0458  LABPROT 13.3  INR 0.99     Radiology: Dg Chest 2 View  12/06/2011  *RADIOLOGY REPORT*  Clinical Data: Chest pain and shortness of breath.  CHEST - 2 VIEW  Comparison: None.  Findings: The lungs are clear without focal infiltrate, edema, pneumothorax or pleural effusion. Interstitial markings are diffusely coarsened with chronic features. Cardiopericardial silhouette is at upper limits of normal for size. Imaged bony structures of the thorax are intact.  IMPRESSION: Underlying chronic interstitial changes.  No acute cardiopulmonary findings.  Original Report Authenticated By: ERIC A. MANSELL, M.D.    Assessment/Plan: S/P Procedure(s) (LRB): LEFT HEART CATHETERIZATION WITH CORONARY ANGIOGRAM (N/A) CABG tomorrow   Delight Ovens MD 12/09/2011 7:30  PM

## 2011-12-09 NOTE — Progress Notes (Signed)
ANTICOAGULATION CONSULT NOTE   Pharmacy Consult for Heparin Indication: chest pain/ACS  No Known Allergies  Patient Measurements: Height: 5\' 10"  (177.8 cm) Weight: 200 lb 9.9 oz (91 kg) IBW/kg (Calculated) : 73  Heparin Dosing Weight: 92 kg  Vital Signs: Temp: 97.4 F (36.3 C) (05/09 0403) Temp src: Oral (05/09 0403) BP: 119/81 mmHg (05/09 0403) Pulse Rate: 78  (05/09 0403)  Labs:  Basename 12/09/11 0545 12/08/11 0043 12/07/11 1150 12/07/11 0458 12/06/11 2355 12/06/11 2000  HGB 16.0 14.9 -- -- -- --  HCT 46.5 42.3 -- 42.9 -- --  PLT 180 174 -- 178 -- --  APTT -- -- -- -- -- --  LABPROT -- -- -- 13.3 -- --  INR -- -- -- 0.99 -- --  HEPARINUNFRC 0.43 0.30 -- 0.43 -- --  CREATININE -- -- -- 1.33 -- 1.34  CKTOTAL -- -- 152 167 204 --  CKMB -- -- 3.0 3.6 4.6* --  TROPONINI -- -- <0.30 <0.30 <0.30 --   Estimated Creatinine Clearance: 70.4 ml/min (by C-G formula based on Cr of 1.33).  Assessment: 56 yo male with 3v CAD, awaiting CABG likely 12/10/11 on heparin. No bleeding issues noted. Platelets stable.   Goal of Therapy:  Heparin level 0.3-0.7 units/ml Monitor platelets by anticoagulation protocol: Yes   Plan:  Continue heparin at 1300 units/hr Plan for CABG in am  Michael Spears 12/09/2011,10:24 AM

## 2011-12-09 NOTE — Plan of Care (Signed)
Problem: Consults Goal: Cardiac Surgery Patient Education ( See Patient Education module for education specifics.) Outcome: Completed/Met Date Met:  12/09/11 Video watched, book given, IS given and explained with demonstration of skill by pt

## 2011-12-10 ENCOUNTER — Encounter (HOSPITAL_COMMUNITY): Payer: Self-pay | Admitting: Anesthesiology

## 2011-12-10 ENCOUNTER — Encounter (HOSPITAL_COMMUNITY)
Admission: EM | Disposition: A | Discharge status: Home / Self Care | Payer: Self-pay | Source: Ambulatory Visit | Attending: Cardiothoracic Surgery

## 2011-12-10 ENCOUNTER — Inpatient Hospital Stay (HOSPITAL_COMMUNITY): Payer: PRIVATE HEALTH INSURANCE | Admitting: Anesthesiology

## 2011-12-10 ENCOUNTER — Inpatient Hospital Stay (HOSPITAL_COMMUNITY): Payer: PRIVATE HEALTH INSURANCE

## 2011-12-10 DIAGNOSIS — I2510 Atherosclerotic heart disease of native coronary artery without angina pectoris: Secondary | ICD-10-CM

## 2011-12-10 HISTORY — PX: CORONARY ARTERY BYPASS GRAFT: SHX141

## 2011-12-10 LAB — POCT I-STAT 3, ART BLOOD GAS (G3+)
Acid-base deficit: 5 mmol/L — ABNORMAL HIGH (ref 0.0–2.0)
Acid-base deficit: 4 mmol/L — ABNORMAL HIGH (ref 0.0–2.0)
Bicarbonate: 21.4 mEq/L (ref 20.0–24.0)
Bicarbonate: 21.7 mEq/L (ref 20.0–24.0)
O2 Saturation: 100 %
O2 Saturation: 100 %
O2 Saturation: 99 %
O2 Saturation: 96 %
Patient temperature: 36.2
Patient temperature: 37
Patient temperature: 36.9
TCO2: 23 mmol/L (ref 0–100)
TCO2: 23 mmol/L (ref 0–100)
TCO2: 23 mmol/L (ref 0–100)
TCO2: 23 mmol/L (ref 0–100)
pCO2 arterial: 40.7 mmHg (ref 35.0–45.0)
pCO2 arterial: 40.6 mmHg (ref 35.0–45.0)
pCO2 arterial: 42.1 mmHg (ref 35.0–45.0)
pH, Arterial: 7.324 — ABNORMAL LOW (ref 7.350–7.450)
pH, Arterial: 7.359 (ref 7.350–7.450)
pH, Arterial: 7.356 (ref 7.350–7.450)
pH, Arterial: 7.325 — ABNORMAL LOW (ref 7.350–7.450)
pO2, Arterial: 397 mmHg — ABNORMAL HIGH (ref 80.0–100.0)
pO2, Arterial: 88 mmHg (ref 80.0–100.0)
pO2, Arterial: 83 mmHg (ref 80.0–100.0)

## 2011-12-10 LAB — CBC
HCT: 35 % — ABNORMAL LOW (ref 39.0–52.0)
HCT: 37.3 % — ABNORMAL LOW (ref 39.0–52.0)
Hemoglobin: 13.3 g/dL (ref 13.0–17.0)
MCH: 30.3 pg (ref 26.0–34.0)
MCHC: 35.7 g/dL (ref 30.0–36.0)
MCV: 85 fL (ref 78.0–100.0)
Platelets: 121 10*3/uL — ABNORMAL LOW (ref 150–400)
Platelets: 122 10*3/uL — ABNORMAL LOW (ref 150–400)
RBC: 4.39 MIL/uL (ref 4.22–5.81)
RDW: 13 % (ref 11.5–15.5)
RDW: 12.9 % (ref 11.5–15.5)
WBC: 10.9 10*3/uL — ABNORMAL HIGH (ref 4.0–10.5)
WBC: 13.3 10*3/uL — ABNORMAL HIGH (ref 4.0–10.5)

## 2011-12-10 LAB — HEMOGLOBIN AND HEMATOCRIT, BLOOD
HCT: 30.1 % — ABNORMAL LOW (ref 39.0–52.0)
Hemoglobin: 10.7 g/dL — ABNORMAL LOW (ref 13.0–17.0)

## 2011-12-10 LAB — POCT I-STAT, CHEM 8
BUN: 14 mg/dL (ref 6–23)
Calcium, Ion: 1.21 mmol/L (ref 1.12–1.32)
Chloride: 108 mEq/L (ref 96–112)
Creatinine, Ser: 1.2 mg/dL (ref 0.50–1.35)
TCO2: 22 mmol/L (ref 0–100)

## 2011-12-10 LAB — POCT I-STAT 4, (NA,K, GLUC, HGB,HCT)
Glucose, Bld: 101 mg/dL — ABNORMAL HIGH (ref 70–99)
Glucose, Bld: 144 mg/dL — ABNORMAL HIGH (ref 70–99)
HCT: 36 % — ABNORMAL LOW (ref 39.0–52.0)
HCT: 29 % — ABNORMAL LOW (ref 39.0–52.0)
HCT: 30 % — ABNORMAL LOW (ref 39.0–52.0)
Hemoglobin: 11.2 g/dL — ABNORMAL LOW (ref 13.0–17.0)
Potassium: 4.9 mEq/L (ref 3.5–5.1)
Potassium: 4.1 mEq/L (ref 3.5–5.1)
Potassium: 3.9 mEq/L (ref 3.5–5.1)
Sodium: 139 mEq/L (ref 135–145)
Sodium: 137 mEq/L (ref 135–145)
Sodium: 141 mEq/L (ref 135–145)

## 2011-12-10 LAB — GLUCOSE, CAPILLARY
Glucose-Capillary: 88 mg/dL (ref 70–99)
Glucose-Capillary: 128 mg/dL — ABNORMAL HIGH (ref 70–99)
Glucose-Capillary: 124 mg/dL — ABNORMAL HIGH (ref 70–99)
Glucose-Capillary: 114 mg/dL — ABNORMAL HIGH (ref 70–99)
Glucose-Capillary: 126 mg/dL — ABNORMAL HIGH (ref 70–99)
Glucose-Capillary: 145 mg/dL — ABNORMAL HIGH (ref 70–99)

## 2011-12-10 LAB — CREATININE, SERUM
Creatinine, Ser: 1.16 mg/dL (ref 0.50–1.35)
GFR calc Af Amer: 80 mL/min — ABNORMAL LOW (ref >90)
GFR calc non Af Amer: 69 mL/min — ABNORMAL LOW (ref >90)

## 2011-12-10 LAB — PROTIME-INR
INR: 1.24 (ref 0.00–1.49)
Prothrombin Time: 15.9 seconds — ABNORMAL HIGH (ref 11.6–15.2)

## 2011-12-10 LAB — MAGNESIUM: Magnesium: 3.1 mg/dL — ABNORMAL HIGH (ref 1.5–2.5)

## 2011-12-10 LAB — APTT: aPTT: 39 seconds — ABNORMAL HIGH (ref 24–37)

## 2011-12-10 LAB — PLATELET COUNT: Platelets: 132 10*3/uL — ABNORMAL LOW (ref 150–400)

## 2011-12-10 SURGERY — CORONARY ARTERY BYPASS GRAFTING (CABG)
Anesthesia: General | Site: Chest | Wound class: Clean

## 2011-12-10 MED ORDER — ALBUMIN HUMAN 5 % IV SOLN
INTRAVENOUS | Status: DC | PRN
  Administered 2011-12-10 (×2): via INTRAVENOUS

## 2011-12-10 MED ORDER — BISACODYL 10 MG RE SUPP: 10.0000 mg | Freq: Every day | RECTAL | Status: DC

## 2011-12-10 MED ORDER — METOPROLOL TARTRATE 25 MG/10 ML ORAL SUSPENSION
12.5000 mg | Freq: Two times a day (BID) | ORAL | Status: DC
  Administered 2011-12-11: 12.5 mg
  Filled 2011-12-10 (×5): qty 5

## 2011-12-10 MED ORDER — ROCURONIUM BROMIDE 100 MG/10ML IV SOLN
INTRAVENOUS | Status: DC | PRN
  Administered 2011-12-10: 50 mg via INTRAVENOUS

## 2011-12-10 MED ORDER — LACTATED RINGERS IV SOLN: 500.0000 mL | Freq: Once | INTRAVENOUS | Status: AC | PRN

## 2011-12-10 MED ORDER — PANTOPRAZOLE SODIUM 40 MG PO TBEC
40.0000 mg | DELAYED_RELEASE_TABLET | Freq: Every day | ORAL | Status: DC
  Administered 2011-12-12: 40 mg via ORAL
  Filled 2011-12-10: qty 1

## 2011-12-10 MED ORDER — SODIUM BICARBONATE 8.4 % IV SOLN
25.0000 meq | Freq: Once | INTRAVENOUS | Status: AC
  Administered 2011-12-10: 25 meq via INTRAVENOUS
  Filled 2011-12-10: qty 25

## 2011-12-10 MED ORDER — SODIUM CHLORIDE 0.9 % IJ SOLN
3.0000 mL | Freq: Two times a day (BID) | INTRAMUSCULAR | Status: DC
  Administered 2011-12-11 (×2): 3 mL via INTRAVENOUS
  Administered 2011-12-12: 6 mL via INTRAVENOUS

## 2011-12-10 MED ORDER — INSULIN REGULAR BOLUS VIA INFUSION
0.0000 [IU] | Freq: Three times a day (TID) | INTRAVENOUS | Status: DC
  Filled 2011-12-10: qty 10

## 2011-12-10 MED ORDER — ACETAMINOPHEN 160 MG/5ML PO SOLN
975.0000 mg | Freq: Four times a day (QID) | ORAL | Status: DC
  Filled 2011-12-10: qty 40.6

## 2011-12-10 MED ORDER — SODIUM CHLORIDE 0.9 % IJ SOLN
OROMUCOSAL | Status: DC | PRN
  Administered 2011-12-10: 10:00:00 via TOPICAL

## 2011-12-10 MED ORDER — SODIUM CHLORIDE 0.9 % IV SOLN
0.1000 ug/kg/h | INTRAVENOUS | Status: DC
  Administered 2011-12-11: 0.2 ug/kg/h via INTRAVENOUS
  Filled 2011-12-10 (×2): qty 2

## 2011-12-10 MED ORDER — FAMOTIDINE IN NACL 20-0.9 MG/50ML-% IV SOLN
20.0000 mg | Freq: Two times a day (BID) | INTRAVENOUS | Status: AC
  Administered 2011-12-10: 20 mg via INTRAVENOUS

## 2011-12-10 MED ORDER — SODIUM CHLORIDE 0.9 % IJ SOLN: 3.0000 mL | INTRAMUSCULAR | Status: DC | PRN

## 2011-12-10 MED ORDER — SODIUM CHLORIDE 0.45 % IV SOLN
INTRAVENOUS | Status: DC
Start: 2011-12-10 — End: 2011-12-12

## 2011-12-10 MED ORDER — VECURONIUM BROMIDE 10 MG IV SOLR
INTRAVENOUS | Status: DC | PRN
  Administered 2011-12-10 (×7): 5 mg via INTRAVENOUS

## 2011-12-10 MED ORDER — BISACODYL 5 MG PO TBEC
10.0000 mg | DELAYED_RELEASE_TABLET | Freq: Every day | ORAL | Status: DC
  Administered 2011-12-11 – 2011-12-12 (×2): 10 mg via ORAL
  Filled 2011-12-10 (×2): qty 2

## 2011-12-10 MED ORDER — FENTANYL CITRATE 0.05 MG/ML IJ SOLN
INTRAMUSCULAR | Status: DC | PRN
  Administered 2011-12-10: 100 ug via INTRAVENOUS
  Administered 2011-12-10: 250 ug via INTRAVENOUS
  Administered 2011-12-10: 150 ug via INTRAVENOUS
  Administered 2011-12-10: 450 ug via INTRAVENOUS
  Administered 2011-12-10: 100 ug via INTRAVENOUS
  Administered 2011-12-10 (×2): 50 ug via INTRAVENOUS

## 2011-12-10 MED ORDER — METOPROLOL TARTRATE 12.5 MG HALF TABLET
12.5000 mg | ORAL_TABLET | Freq: Two times a day (BID) | ORAL | Status: DC
  Administered 2011-12-12: 12.5 mg via ORAL
  Filled 2011-12-10 (×5): qty 1

## 2011-12-10 MED ORDER — POTASSIUM CHLORIDE 10 MEQ/50ML IV SOLN
10.0000 meq | INTRAVENOUS | Status: AC
  Administered 2011-12-10 (×3): 10 meq via INTRAVENOUS

## 2011-12-10 MED ORDER — LACTATED RINGERS IV SOLN: INTRAVENOUS | Status: DC

## 2011-12-10 MED ORDER — OXYCODONE HCL 5 MG PO TABS
5.0000 mg | ORAL_TABLET | ORAL | Status: DC | PRN
  Administered 2011-12-11 – 2011-12-12 (×7): 10 mg via ORAL
  Filled 2011-12-10: qty 2
  Filled 2011-12-10: qty 1
  Filled 2011-12-10 (×4): qty 2
  Filled 2011-12-10: qty 1
  Filled 2011-12-10: qty 2

## 2011-12-10 MED ORDER — EPHEDRINE SULFATE 50 MG/ML IJ SOLN
INTRAMUSCULAR | Status: DC | PRN
  Administered 2011-12-10: 15 mg via INTRAVENOUS

## 2011-12-10 MED ORDER — ASPIRIN EC 325 MG PO TBEC
325.0000 mg | DELAYED_RELEASE_TABLET | Freq: Every day | ORAL | Status: DC
  Administered 2011-12-11 – 2011-12-12 (×2): 325 mg via ORAL
  Filled 2011-12-10 (×2): qty 1

## 2011-12-10 MED ORDER — LACTATED RINGERS IV SOLN
INTRAVENOUS | Status: DC | PRN
  Administered 2011-12-10 (×4): via INTRAVENOUS

## 2011-12-10 MED ORDER — ASPIRIN 81 MG PO CHEW: 324.0000 mg | CHEWABLE_TABLET | Freq: Every day | ORAL | Status: DC

## 2011-12-10 MED ORDER — HEMOSTATIC AGENTS (NO CHARGE) OPTIME
TOPICAL | Status: DC | PRN
  Administered 2011-12-10: 1 via TOPICAL

## 2011-12-10 MED ORDER — SODIUM CHLORIDE 0.9 % IV SOLN: 250.0000 mL | INTRAVENOUS | Status: DC

## 2011-12-10 MED ORDER — MORPHINE SULFATE 2 MG/ML IJ SOLN
2.0000 mg | INTRAMUSCULAR | Status: DC | PRN
  Administered 2011-12-10 – 2011-12-12 (×7): 2 mg via INTRAVENOUS
  Filled 2011-12-10 (×5): qty 1

## 2011-12-10 MED ORDER — ONDANSETRON HCL 4 MG/2ML IJ SOLN
4.0000 mg | Freq: Four times a day (QID) | INTRAMUSCULAR | Status: DC | PRN
  Administered 2011-12-11 – 2011-12-12 (×2): 4 mg via INTRAVENOUS
  Filled 2011-12-10 (×2): qty 2

## 2011-12-10 MED ORDER — MIDAZOLAM HCL 2 MG/2ML IJ SOLN: 2.0000 mg | INTRAMUSCULAR | Status: DC | PRN

## 2011-12-10 MED ORDER — HEPARIN SODIUM (PORCINE) 1000 UNIT/ML IJ SOLN
INTRAMUSCULAR | Status: DC | PRN
  Administered 2011-12-10: 34000 [IU] via INTRAVENOUS

## 2011-12-10 MED ORDER — SODIUM CHLORIDE 0.9 % IV SOLN
INTRAVENOUS | Status: DC
  Filled 2011-12-10: qty 1

## 2011-12-10 MED ORDER — NITROGLYCERIN IN D5W 200-5 MCG/ML-% IV SOLN: 0.0000 ug/min | INTRAVENOUS | Status: DC

## 2011-12-10 MED ORDER — ACETAMINOPHEN 160 MG/5ML PO SOLN: 650.0000 mg | ORAL | Status: AC

## 2011-12-10 MED ORDER — ACETAMINOPHEN 650 MG RE SUPP
650.0000 mg | RECTAL | Status: AC
  Administered 2011-12-10: 650 mg via RECTAL

## 2011-12-10 MED ORDER — PROTAMINE SULFATE 10 MG/ML IV SOLN
INTRAVENOUS | Status: DC | PRN
  Administered 2011-12-10: 20 mg via INTRAVENOUS
  Administered 2011-12-10: 100 mg via INTRAVENOUS
  Administered 2011-12-10: 90 mg via INTRAVENOUS
  Administered 2011-12-10: 100 mg via INTRAVENOUS

## 2011-12-10 MED ORDER — 0.9 % SODIUM CHLORIDE (POUR BTL) OPTIME
TOPICAL | Status: DC | PRN
  Administered 2011-12-10: 6000 mL

## 2011-12-10 MED ORDER — SODIUM CHLORIDE 0.9 % IV SOLN: INTRAVENOUS | Status: DC

## 2011-12-10 MED ORDER — PHENYLEPHRINE HCL 10 MG/ML IJ SOLN
0.0000 ug/min | INTRAVENOUS | Status: DC
  Administered 2011-12-11: 30 ug/min via INTRAVENOUS
  Filled 2011-12-10 (×2): qty 2

## 2011-12-10 MED ORDER — PROPOFOL 10 MG/ML IV EMUL
INTRAVENOUS | Status: DC | PRN
  Administered 2011-12-10: 20 mg via INTRAVENOUS
  Administered 2011-12-10: 70 mg via INTRAVENOUS

## 2011-12-10 MED ORDER — VANCOMYCIN HCL IN DEXTROSE 1-5 GM/200ML-% IV SOLN
1000.0000 mg | Freq: Once | INTRAVENOUS | Status: AC
  Administered 2011-12-10: 1000 mg via INTRAVENOUS
  Filled 2011-12-10: qty 200

## 2011-12-10 MED ORDER — DOCUSATE SODIUM 100 MG PO CAPS
200.0000 mg | ORAL_CAPSULE | Freq: Every day | ORAL | Status: DC
  Administered 2011-12-11 – 2011-12-12 (×2): 200 mg via ORAL
  Filled 2011-12-10 (×2): qty 2
  Filled 2011-12-10: qty 1

## 2011-12-10 MED ORDER — ACETAMINOPHEN 500 MG PO TABS
1000.0000 mg | ORAL_TABLET | Freq: Four times a day (QID) | ORAL | Status: DC
  Administered 2011-12-11 – 2011-12-12 (×5): 1000 mg via ORAL
  Filled 2011-12-10 (×8): qty 2

## 2011-12-10 MED ORDER — METOPROLOL TARTRATE 1 MG/ML IV SOLN: 2.5000 mg | INTRAVENOUS | Status: DC | PRN

## 2011-12-10 MED ORDER — SODIUM CHLORIDE 0.9 % IV SOLN
INTRAVENOUS | Status: DC | PRN
  Administered 2011-12-10: 13:00:00 via INTRAVENOUS

## 2011-12-10 MED ORDER — MORPHINE SULFATE 2 MG/ML IJ SOLN
1.0000 mg | INTRAMUSCULAR | Status: AC | PRN
  Filled 2011-12-10 (×2): qty 1

## 2011-12-10 MED ORDER — DEXTROSE 5 % IV SOLN
1.5000 g | Freq: Two times a day (BID) | INTRAVENOUS | Status: AC
  Administered 2011-12-10 – 2011-12-12 (×4): 1.5 g via INTRAVENOUS
  Filled 2011-12-10 (×4): qty 1.5

## 2011-12-10 MED ORDER — MAGNESIUM SULFATE 40 MG/ML IJ SOLN
4.0000 g | Freq: Once | INTRAMUSCULAR | Status: AC
  Administered 2011-12-10: 4 g via INTRAVENOUS
  Filled 2011-12-10: qty 100

## 2011-12-10 MED ORDER — MIDAZOLAM HCL 5 MG/5ML IJ SOLN
INTRAMUSCULAR | Status: DC | PRN
  Administered 2011-12-10 (×2): 2 mg via INTRAVENOUS
  Administered 2011-12-10: 6 mg via INTRAVENOUS

## 2011-12-10 MED ORDER — ALBUMIN HUMAN 5 % IV SOLN
250.0000 mL | INTRAVENOUS | Status: AC | PRN
  Administered 2011-12-11 (×2): 250 mL via INTRAVENOUS

## 2011-12-10 SURGICAL SUPPLY — 117 items
ATTRACTOMAT 16X20 MAGNETIC DRP (DRAPES) ×2 IMPLANT
BAG DECANTER FOR FLEXI CONT (MISCELLANEOUS) ×2 IMPLANT
BANDAGE ELASTIC 4 VELCRO ST LF (GAUZE/BANDAGES/DRESSINGS) ×2 IMPLANT
BANDAGE ELASTIC 6 VELCRO ST LF (GAUZE/BANDAGES/DRESSINGS) ×2 IMPLANT
BANDAGE GAUZE ELAST BULKY 4 IN (GAUZE/BANDAGES/DRESSINGS) ×2 IMPLANT
BLADE STERNUM SYSTEM 6 (BLADE) ×3 IMPLANT
BLADE SURG 11 STRL SS (BLADE) ×1 IMPLANT
BLADE SURG ROTATE 9660 (MISCELLANEOUS) IMPLANT
CANISTER SUCTION 2500CC (MISCELLANEOUS) ×2 IMPLANT
CANN PRFSN .5XCNCT 15X34-48 (MISCELLANEOUS) ×1
CANNULA AORTIC HI-FLOW 6.5M20F (CANNULA) ×2 IMPLANT
CANNULA PRFSN .5XCNCT 15X34-48 (MISCELLANEOUS) ×1 IMPLANT
CANNULA VEN 2 STAGE (MISCELLANEOUS) ×2
CATH CPB KIT GERHARDT (MISCELLANEOUS) ×2 IMPLANT
CATH THORACIC 28FR (CATHETERS) ×2 IMPLANT
CATH THORACIC 36FR (CATHETERS) IMPLANT
CATH THORACIC 36FR RT ANG (CATHETERS) IMPLANT
CLIP TI MEDIUM 24 (CLIP) IMPLANT
CLIP TI WIDE RED SMALL 24 (CLIP) ×1 IMPLANT
CLOTH BEACON ORANGE TIMEOUT ST (SAFETY) ×2 IMPLANT
CONN ST 1/4X3/8  BEN (MISCELLANEOUS) ×1
CONN ST 1/4X3/8 BEN (MISCELLANEOUS) IMPLANT
COVER SURGICAL LIGHT HANDLE (MISCELLANEOUS) ×4 IMPLANT
CRADLE DONUT ADULT HEAD (MISCELLANEOUS) ×2 IMPLANT
DRAIN CHANNEL 28F RND 3/8 FF (WOUND CARE) ×2 IMPLANT
DRAIN CHANNEL 32F RND 10.7 FF (WOUND CARE) IMPLANT
DRAPE CARDIOVASCULAR INCISE (DRAPES) ×2
DRAPE SLUSH MACHINE 52X66 (DRAPES) IMPLANT
DRAPE SLUSH/WARMER DISC (DRAPES) IMPLANT
DRAPE SRG 135X102X78XABS (DRAPES) ×1 IMPLANT
DRSG COVADERM 4X14 (GAUZE/BANDAGES/DRESSINGS) ×2 IMPLANT
ELECT BLADE 4.0 EZ CLEAN MEGAD (MISCELLANEOUS) ×2
ELECT CAUTERY BLADE 6.4 (BLADE) ×2 IMPLANT
ELECT REM PT RETURN 9FT ADLT (ELECTROSURGICAL) ×4
ELECTRODE BLDE 4.0 EZ CLN MEGD (MISCELLANEOUS) ×1 IMPLANT
ELECTRODE REM PT RTRN 9FT ADLT (ELECTROSURGICAL) ×2 IMPLANT
GLOVE BIO SURGEON STRL SZ 6 (GLOVE) ×8 IMPLANT
GLOVE BIO SURGEON STRL SZ 6.5 (GLOVE) ×6 IMPLANT
GLOVE BIO SURGEON STRL SZ7 (GLOVE) IMPLANT
GLOVE BIO SURGEON STRL SZ7.5 (GLOVE) ×3 IMPLANT
GLOVE BIOGEL PI IND STRL 6 (GLOVE) IMPLANT
GLOVE BIOGEL PI IND STRL 6.5 (GLOVE) IMPLANT
GLOVE BIOGEL PI IND STRL 7.0 (GLOVE) IMPLANT
GLOVE BIOGEL PI IND STRL 8 (GLOVE) IMPLANT
GLOVE BIOGEL PI INDICATOR 6 (GLOVE)
GLOVE BIOGEL PI INDICATOR 6.5 (GLOVE) ×8
GLOVE BIOGEL PI INDICATOR 7.0 (GLOVE)
GLOVE BIOGEL PI INDICATOR 8 (GLOVE) ×2
GLOVE EUDERMIC 7 POWDERFREE (GLOVE) IMPLANT
GLOVE ORTHO TXT STRL SZ7.5 (GLOVE) IMPLANT
GOWN PREVENTION PLUS XLARGE (GOWN DISPOSABLE) ×4 IMPLANT
GOWN STRL NON-REIN LRG LVL3 (GOWN DISPOSABLE) ×10 IMPLANT
HEMOSTAT POWDER SURGIFOAM 1G (HEMOSTASIS) ×7 IMPLANT
HEMOSTAT SURGICEL 2X14 (HEMOSTASIS) ×2 IMPLANT
INSERT FOGARTY 61MM (MISCELLANEOUS) IMPLANT
INSERT FOGARTY XLG (MISCELLANEOUS) IMPLANT
KIT BASIN OR (CUSTOM PROCEDURE TRAY) ×2 IMPLANT
KIT ROOM TURNOVER OR (KITS) ×2 IMPLANT
KIT SUCTION CATH 14FR (SUCTIONS) ×4 IMPLANT
KIT VASOVIEW W/TROCAR VH 2000 (KITS) ×2 IMPLANT
LEAD PACING MYOCARDI (MISCELLANEOUS) ×2 IMPLANT
MARKER GRAFT CORONARY BYPASS (MISCELLANEOUS) ×6 IMPLANT
NS IRRIG 1000ML POUR BTL (IV SOLUTION) ×10 IMPLANT
PACK OPEN HEART (CUSTOM PROCEDURE TRAY) ×2 IMPLANT
PAD ARMBOARD 7.5X6 YLW CONV (MISCELLANEOUS) ×4 IMPLANT
PENCIL BUTTON HOLSTER BLD 10FT (ELECTRODE) ×2 IMPLANT
PUNCH AORTIC ROTATE 4.0MM (MISCELLANEOUS) IMPLANT
PUNCH AORTIC ROTATE 4.5MM 8IN (MISCELLANEOUS) ×1 IMPLANT
PUNCH AORTIC ROTATE 5MM 8IN (MISCELLANEOUS) IMPLANT
SET CARDIOPLEGIA MPS 5001102 (MISCELLANEOUS) ×1 IMPLANT
SOLUTION ANTI FOG 6CC (MISCELLANEOUS) ×1 IMPLANT
SPONGE GAUZE 4X4 12PLY (GAUZE/BANDAGES/DRESSINGS) ×4 IMPLANT
SPONGE LAP 18X18 X RAY DECT (DISPOSABLE) ×2 IMPLANT
SPONGE LAP 4X18 X RAY DECT (DISPOSABLE) IMPLANT
SUT BONE WAX W31G (SUTURE) ×2 IMPLANT
SUT MNCRL AB 4-0 PS2 18 (SUTURE) ×1 IMPLANT
SUT PROLENE 3 0 SH DA (SUTURE) IMPLANT
SUT PROLENE 3 0 SH1 36 (SUTURE) ×2 IMPLANT
SUT PROLENE 4 0 RB 1 (SUTURE)
SUT PROLENE 4 0 SH DA (SUTURE) IMPLANT
SUT PROLENE 4 0 TF (SUTURE) ×4 IMPLANT
SUT PROLENE 4-0 RB1 .5 CRCL 36 (SUTURE) IMPLANT
SUT PROLENE 5 0 C 1 36 (SUTURE) ×1 IMPLANT
SUT PROLENE 6 0 C 1 30 (SUTURE) IMPLANT
SUT PROLENE 6 0 CC (SUTURE) ×4 IMPLANT
SUT PROLENE 7 0 BV 1 (SUTURE) IMPLANT
SUT PROLENE 7 0 BV1 MDA (SUTURE) ×4 IMPLANT
SUT PROLENE 7.0 RB 3 (SUTURE) IMPLANT
SUT PROLENE 8 0 BV175 6 (SUTURE) ×11 IMPLANT
SUT SILK  1 MH (SUTURE)
SUT SILK 1 MH (SUTURE) IMPLANT
SUT SILK 2 0 SH CR/8 (SUTURE) IMPLANT
SUT SILK 3 0 SH CR/8 (SUTURE) IMPLANT
SUT STEEL 6MS V (SUTURE) ×2 IMPLANT
SUT STEEL STERNAL CCS#1 18IN (SUTURE) IMPLANT
SUT STEEL SZ 6 DBL 3X14 BALL (SUTURE) ×2 IMPLANT
SUT VIC AB 1 CTX 18 (SUTURE) ×4 IMPLANT
SUT VIC AB 1 CTX 36 (SUTURE)
SUT VIC AB 1 CTX36XBRD ANBCTR (SUTURE) IMPLANT
SUT VIC AB 2-0 CT1 27 (SUTURE) ×2
SUT VIC AB 2-0 CT1 TAPERPNT 27 (SUTURE) IMPLANT
SUT VIC AB 2-0 CTX 27 (SUTURE) IMPLANT
SUT VIC AB 3-0 SH 27 (SUTURE)
SUT VIC AB 3-0 SH 27X BRD (SUTURE) IMPLANT
SUT VIC AB 3-0 X1 27 (SUTURE) IMPLANT
SUT VICRYL 4-0 PS2 18IN ABS (SUTURE) ×1 IMPLANT
SUTURE E-PAK OPEN HEART (SUTURE) ×2 IMPLANT
SYSTEM SAHARA CHEST DRAIN ATS (WOUND CARE) ×2 IMPLANT
TAPE CLOTH SURG 4X10 WHT LF (GAUZE/BANDAGES/DRESSINGS) ×1 IMPLANT
TOWEL OR 17X24 6PK STRL BLUE (TOWEL DISPOSABLE) ×4 IMPLANT
TOWEL OR 17X26 10 PK STRL BLUE (TOWEL DISPOSABLE) ×4 IMPLANT
TRAY FOLEY IC TEMP SENS 14FR (CATHETERS) ×2 IMPLANT
TUBE FEEDING 8FR 16IN STR KANG (MISCELLANEOUS) ×2 IMPLANT
TUBE SUCT INTRACARD DLP 20F (MISCELLANEOUS) ×2 IMPLANT
TUBING INSUFFLATION 10FT LAP (TUBING) ×2 IMPLANT
UNDERPAD 30X30 INCONTINENT (UNDERPADS AND DIAPERS) ×2 IMPLANT
WATER STERILE IRR 1000ML POUR (IV SOLUTION) ×4 IMPLANT

## 2011-12-10 NOTE — Progress Notes (Signed)
Patient ID: Michael Spears, male   DOB: 08-Feb-1956, 56 y.o.   MRN: [id number]  Filed Vitals:   12/10/11 1830 12/10/11 1845 12/10/11 1900 12/10/11 1930  BP:      Pulse: 95 95 96 96  Temp: 98.6 F (37 C) 98.4 F (36.9 C) 98.4 F (36.9 C) 98.4 F (36.9 C)  TempSrc:      Resp: 18 17 18 18   Height:      Weight:      SpO2: 96% 96% 96% 96%   Extubated  Urine output ok CT output ok  CBC    Component Value Date/Time   WBC 13.3* 12/10/2011 1841   RBC 4.39 12/10/2011 1841   HGB 12.9* 12/10/2011 1857   HCT 38.0* 12/10/2011 1857   PLT 122* 12/10/2011 1841   MCV 85.0 12/10/2011 1841   MCH 30.3 12/10/2011 1841   MCHC 35.7 12/10/2011 1841   RDW 12.9 12/10/2011 1841   LYMPHSABS 2.6 12/06/2011 2000   MONOABS 0.8 12/06/2011 2000   EOSABS 0.2 12/06/2011 2000   BASOSABS 0.0 12/06/2011 2000    BMET    Component Value Date/Time   NA 140 12/10/2011 1857   K 4.6 12/10/2011 1857   CL 108 12/10/2011 1857   CO2 27 12/07/2011 0458   GLUCOSE 130* 12/10/2011 1857   BUN 14 12/10/2011 1857   CREATININE 1.20 12/10/2011 1857   CALCIUM 9.4 12/07/2011 0458   GFRNONAA 69* 12/10/2011 1841   GFRAA 80* 12/10/2011 1841    A/P:  Stable postop course

## 2011-12-10 NOTE — OR Nursing (Signed)
Skip Estimable served as Materials engineer and Lowella Dandy is Advice worker; not available to chart on staffing page of chart.

## 2011-12-10 NOTE — OR Nursing (Signed)
Call placed to [facility] unit for family at removal of retractor at 12:20. Call placed to Unit [facility] at removal of retractor at 12:20, skin suture at 12:56 and patient exit.

## 2011-12-10 NOTE — OR Nursing (Signed)
Time out conducted with PA prior to leg incision start at 08:15. Separate time out conducted with surgeon prior to chest incision start at 08:22.

## 2011-12-10 NOTE — Preoperative (Signed)
Beta Blockers   Reason not to administer Beta Blockers:Not Applicable 

## 2011-12-10 NOTE — Anesthesia Preprocedure Evaluation (Addendum)
Anesthesia Evaluation  Patient identified by MRN, date of birth, ID band Patient awake    Reviewed: Allergy & Precautions, H&P , NPO status , Patient's Chart, lab work & pertinent test results, reviewed documented beta blocker date and time   Airway Mallampati: II TM Distance: >3 FB Neck ROM: Full    Dental  (+) Teeth Intact and Dental Advisory Given   Pulmonary          Cardiovascular + angina + CAD     Neuro/Psych  Headaches, Fibromyalgia   Neuromuscular disease    GI/Hepatic   Endo/Other    Renal/GU      Musculoskeletal   Abdominal   Peds  Hematology   Anesthesia Other Findings   Reproductive/Obstetrics                           Anesthesia Physical Anesthesia Plan  ASA: III  Anesthesia Plan: General   Post-op Pain Management:    Induction: Intravenous  Airway Management Planned: Oral ETT  Additional Equipment: Arterial line, CVP, PA Cath and Ultrasound Guidance Line Placement  Intra-op Plan:   Post-operative Plan:   Informed Consent: I have reviewed the patients History and Physical, chart, labs and discussed the procedure including the risks, benefits and alternatives for the proposed anesthesia with the patient or authorized representative who has indicated his/her understanding and acceptance.   Dental advisory given  Plan Discussed with: Surgeon and CRNA  Anesthesia Plan Comments:        Anesthesia Quick Evaluation

## 2011-12-10 NOTE — Anesthesia Procedure Notes (Signed)
Procedure Name: Intubation Date/Time: 12/10/2011 7:35 AM Performed by: Jerilee Hoh Pre-anesthesia Checklist: Patient identified, Emergency Drugs available, Suction available and Patient being monitored Patient Re-evaluated:Patient Re-evaluated prior to inductionOxygen Delivery Method: Circle system utilized Preoxygenation: Pre-oxygenation with 100% oxygen Intubation Type: IV induction Ventilation: Mask ventilation without difficulty and Oral airway inserted - appropriate to patient size Laryngoscope Size: Mac and 4 Grade View: Grade III Tube type: Oral Tube size: 8.0 mm Number of attempts: 1 Airway Equipment and Method: Stylet Placement Confirmation: ETT inserted through vocal cords under direct vision,  positive ETCO2 and breath sounds checked- equal and bilateral Secured at: 23 cm Tube secured with: Tape Dental Injury: Teeth and Oropharynx as per pre-operative assessment

## 2011-12-10 NOTE — Brief Op Note (Addendum)
12/06/2011 - 12/10/2011  11:29 AM  PATIENT:  Michael Spears  56 y.o. male  PRE-OPERATIVE DIAGNOSIS:  Coronary Artery Disease  POST-OPERATIVE DIAGNOSIS:  Coronary Artery Disease  PROCEDURE:  Procedure(s) (LRB):  CORONARY ARTERY BYPASS GRAFTINGx4  LIMA to LAD  SVG to PDA  Sequential SVG to OM1 and OM2             ENDOSCOPIC SAPHENOUS VEIN HARVEST RIGHT LEG  SURGEON:  Surgeon(s) and Role:    * Delight Ovens, MD - Primary  PHYSICIAN ASSISTANT: Erin Barrett PA-C  ANESTHESIA:   general  EBL:  Total I/O In: -  Out: 350 [Urine:350]  DRAINS: mediastinal chest drains, chest tube     SPECIMEN:  No Specimen    COUNTS:  YES  DICTATION: .Dragon Dictation and Other Dictation: Dictation Number   PLAN OF CARE: Admit to inpatient   PATIENT DISPOSITION:  ICU - intubated and hemodynamically stable.   Delay start of Pharmacological VTE agent (>24hrs) due to surgical blood loss or risk of bleeding: no

## 2011-12-10 NOTE — Procedures (Signed)
Extubation Procedure Note  Patient Details:   Name: OTHA MONICAL DOB: 08/25/1955 MRN: [id number]   Airway Documentation:    Pt awake and alert,extubated per protocol to 3L [place], sat 96%. NIF-28, VC 700, IS 750, positive cuff leak. Pt able to vocalize. No stridor noted. Evaluation  O2 sats: stable throughout and currently acceptable Complications: No apparent complications Patient did tolerate procedure well. Bilateral Breath Sounds: Clear Suctioning: Airway Yes  Arloa Koh 12/10/2011, 5:36 PM

## 2011-12-10 NOTE — Transfer of Care (Signed)
Immediate Anesthesia Transfer of Care Note  Patient: Michael Spears  Procedure(s) Performed: Procedure(s) (LRB): CORONARY ARTERY BYPASS GRAFTING (CABG) (N/A)  Patient Location: SICU  Anesthesia Type: General  Level of Consciousness: unresponsive and Patient remains intubated per anesthesia plan  Airway & Oxygen Therapy: Patient remains intubated per anesthesia plan and Patient placed on Ventilator (see vital sign flow sheet for setting)  Post-op Assessment: Report given to PACU RN and Post -op Vital signs reviewed and stable  Post vital signs: Reviewed and stable  Complications: No apparent anesthesia complications

## 2011-12-11 ENCOUNTER — Inpatient Hospital Stay (HOSPITAL_COMMUNITY): Payer: PRIVATE HEALTH INSURANCE

## 2011-12-11 LAB — BASIC METABOLIC PANEL
GFR calc Af Amer: 70 mL/min — ABNORMAL LOW (ref >90)
GFR calc non Af Amer: 60 mL/min — ABNORMAL LOW (ref >90)
Glucose, Bld: 111 mg/dL — ABNORMAL HIGH (ref 70–99)
Potassium: 4.2 mEq/L (ref 3.5–5.1)
Sodium: 137 mEq/L (ref 135–145)

## 2011-12-11 LAB — GLUCOSE, CAPILLARY
Glucose-Capillary: 139 mg/dL — ABNORMAL HIGH (ref 70–99)
Glucose-Capillary: 126 mg/dL — ABNORMAL HIGH (ref 70–99)

## 2011-12-11 LAB — CBC
Hemoglobin: 12.3 g/dL — ABNORMAL LOW (ref 13.0–17.0)
Hemoglobin: 12.4 g/dL — ABNORMAL LOW (ref 13.0–17.0)
MCHC: 35 g/dL (ref 30.0–36.0)
Platelets: 125 10*3/uL — ABNORMAL LOW (ref 150–400)
Platelets: 101 10*3/uL — ABNORMAL LOW (ref 150–400)
RBC: 4.1 MIL/uL — ABNORMAL LOW (ref 4.22–5.81)
RBC: 4.15 MIL/uL — ABNORMAL LOW (ref 4.22–5.81)
WBC: 13.3 10*3/uL — ABNORMAL HIGH (ref 4.0–10.5)

## 2011-12-11 LAB — POCT I-STAT, CHEM 8
BUN: 19 mg/dL (ref 6–23)
Calcium, Ion: 1.27 mmol/L (ref 1.12–1.32)
Chloride: 104 mEq/L (ref 96–112)
Creatinine, Ser: 1.1 mg/dL (ref 0.50–1.35)
Sodium: 139 mEq/L (ref 135–145)
TCO2: 24 mmol/L (ref 0–100)

## 2011-12-11 LAB — MAGNESIUM
Magnesium: 2.7 mg/dL — ABNORMAL HIGH (ref 1.5–2.5)
Magnesium: 2.6 mg/dL — ABNORMAL HIGH (ref 1.5–2.5)

## 2011-12-11 LAB — CREATININE, SERUM
Creatinine, Ser: 1.13 mg/dL (ref 0.50–1.35)
GFR calc Af Amer: 82 mL/min — ABNORMAL LOW (ref >90)
GFR calc non Af Amer: 71 mL/min — ABNORMAL LOW (ref >90)

## 2011-12-11 LAB — CARDIAC PANEL(CRET KIN+CKTOT+MB+TROPI): CK, MB: 9.2 ng/mL (ref 0.3–4.0)

## 2011-12-11 MED ORDER — INSULIN ASPART 100 UNIT/ML ~~LOC~~ SOLN
0.0000 [IU] | SUBCUTANEOUS | Status: DC
  Administered 2011-12-11 – 2011-12-12 (×3): 2 [IU] via SUBCUTANEOUS

## 2011-12-11 NOTE — Progress Notes (Signed)
1 Day Post-Op Procedure(s) (LRB): CORONARY ARTERY BYPASS GRAFTING (CABG) (N/A) Subjective: C/o pain  Objective: Vital signs in last 24 hours: Temp:  [97 F (36.1 C)-100.2 F (37.9 C)] 99 F (37.2 C) (05/11 1000) Pulse Rate:  [82-99] 97  (05/11 1000) Cardiac Rhythm:  [-] Normal sinus rhythm (05/11 0800) Resp:  [11-23] 19  (05/11 1000) BP: (92-124)/(53-73) 100/60 mmHg (05/11 1000) SpO2:  [94 %-99 %] 96 % (05/11 1000) Arterial Line BP: (84-127)/(52-71) 107/61 mmHg (05/11 1000) FiO2 (%):  [39.9 %-50.5 %] 40 % (05/10 1715) Weight:  [95.3 kg (210 lb 1.6 oz)] 95.3 kg (210 lb 1.6 oz) (05/11 0500)  Hemodynamic parameters for last 24 hours: PAP: (23-46)/(14-32) 31/19 mmHg CO:  [3.9 L/min-5.9 L/min] 4.6 L/min CI:  [1.9 L/min/m2-2.8 L/min/m2] 2.2 L/min/m2  Intake/Output from previous day: 05/10 0701 - 05/11 0700 In: 5353.8 [I.V.:3953.8; Blood:500; NG/GT:30; IV Piggyback:870] Out: 3290 [Urine:1880; Blood:1000; Chest Tube:410] Intake/Output this shift: Total I/O In: 208.8 [I.V.:158.8; IV Piggyback:50] Out: 235 [Urine:175; Chest Tube:60]  General appearance: alert and cooperative Neurologic: intact Heart: regular rate and rhythm, S1, S2 normal, no murmur, click, rub or gallop Lungs: clear to auscultation bilaterally Extremities: edema mild Wound: dressing dry  Lab Results:  Basename 12/11/11 0330 12/10/11 1857 12/10/11 1841  WBC 12.8* -- 13.3*  HGB 12.3* 12.9* --  HCT 35.1* 38.0* --  PLT 125* -- 122*   BMET:  Basename 12/11/11 0330 12/10/11 1857  NA 137 140  K 4.2 4.6  CL 107 108  CO2 23 --  GLUCOSE 111* 130*  BUN 16 14  CREATININE 1.29 1.20  CALCIUM 8.3* --    PT/INR:  Basename 12/10/11 1340  LABPROT 15.9*  INR 1.24   ABG    Component Value Date/Time   PHART 7.311* 12/10/2011 1847   HCO3 21.7 12/10/2011 1847   TCO2 22 12/10/2011 1857   ACIDBASEDEF 4.0* 12/10/2011 1847   O2SAT 95.0 12/10/2011 1847   CBG (last 3)   Basename 12/11/11 0716 12/11/11 0026 12/10/11  2322  GLUCAP 126* 139* 145*  CXR:  Ok  ECG:  Inferior ST elevation with some mild elevation throughout precordial leads.    Assessment/Plan: S/P Procedure(s) (LRB): CORONARY ARTERY BYPASS GRAFTING (CABG) (N/A) Inferior ECG changes: This may be due to pericardial inflammation but will check enzymes this am. Wean off neo Mobilize Diuresis when off neo d/c tubes/lines Continue foley due to patient in ICU and urinary output monitoring See progression orders   LOS: 5 days    Bethann Qualley K 12/11/2011

## 2011-12-11 NOTE — Progress Notes (Signed)
Patient ID: Michael Spears, male   DOB: 1956/03/15, 56 y.o.   MRN: [id number] Filed Vitals:   12/11/11 1700 12/11/11 1800 12/11/11 1900 12/11/11 2000  BP: 101/68 114/78 112/66 122/71  Pulse: 102 104 103 108  Temp:    98.4 F (36.9 C)  TempSrc:      Resp: 18 19 20 21   Height:      Weight:      SpO2: 94% 95% 97% 94%   Neo off today Urine output ok  BMET    Component Value Date/Time   NA 139 12/11/2011 1724   K 4.2 12/11/2011 1724   CL 104 12/11/2011 1724   CO2 23 12/11/2011 0330   GLUCOSE 130* 12/11/2011 1724   BUN 19 12/11/2011 1724   CREATININE 1.10 12/11/2011 1724   CALCIUM 8.3* 12/11/2011 0330   GFRNONAA 71* 12/11/2011 1700   GFRAA 82* 12/11/2011 1700    CBC    Component Value Date/Time   WBC 13.3* 12/11/2011 1700   RBC 4.15* 12/11/2011 1700   HGB 12.2* 12/11/2011 1724   HCT 36.0* 12/11/2011 1724   PLT 101* 12/11/2011 1700   MCV 87.2 12/11/2011 1700   MCH 29.9 12/11/2011 1700   MCHC 34.3 12/11/2011 1700   RDW 13.4 12/11/2011 1700   LYMPHSABS 2.6 12/06/2011 2000   MONOABS 0.8 12/06/2011 2000   EOSABS 0.2 12/06/2011 2000   BASOSABS 0.0 12/06/2011 2000    A/P: stable day

## 2011-12-12 ENCOUNTER — Inpatient Hospital Stay (HOSPITAL_COMMUNITY): Payer: PRIVATE HEALTH INSURANCE

## 2011-12-12 LAB — CBC
Hemoglobin: 11.8 g/dL — ABNORMAL LOW (ref 13.0–17.0)
MCH: 29.9 pg (ref 26.0–34.0)
MCV: 88.6 fL (ref 78.0–100.0)
RBC: 3.95 MIL/uL — ABNORMAL LOW (ref 4.22–5.81)

## 2011-12-12 LAB — BASIC METABOLIC PANEL
CO2: 27 mEq/L (ref 19–32)
Glucose, Bld: 123 mg/dL — ABNORMAL HIGH (ref 70–99)
Potassium: 4.4 mEq/L (ref 3.5–5.1)
Sodium: 136 mEq/L (ref 135–145)

## 2011-12-12 LAB — GLUCOSE, CAPILLARY
Glucose-Capillary: 123 mg/dL — ABNORMAL HIGH (ref 70–99)
Glucose-Capillary: 116 mg/dL — ABNORMAL HIGH (ref 70–99)

## 2011-12-12 MED ORDER — ASPIRIN EC 325 MG PO TBEC
325.0000 mg | DELAYED_RELEASE_TABLET | Freq: Every day | ORAL | Status: DC
  Administered 2011-12-13 – 2011-12-15 (×3): 325 mg via ORAL
  Filled 2011-12-12 (×4): qty 1

## 2011-12-12 MED ORDER — ONDANSETRON HCL 4 MG/2ML IJ SOLN: 4.0000 mg | Freq: Four times a day (QID) | INTRAMUSCULAR | Status: DC | PRN

## 2011-12-12 MED ORDER — FUROSEMIDE 40 MG PO TABS
40.0000 mg | ORAL_TABLET | Freq: Every day | ORAL | Status: AC
  Administered 2011-12-13 – 2011-12-15 (×3): 40 mg via ORAL
  Filled 2011-12-12 (×3): qty 1

## 2011-12-12 MED ORDER — TRAMADOL HCL 50 MG PO TABS
50.0000 mg | ORAL_TABLET | ORAL | Status: DC | PRN
Start: 2011-12-12 — End: 2011-12-15

## 2011-12-12 MED ORDER — BISACODYL 5 MG PO TBEC
10.0000 mg | DELAYED_RELEASE_TABLET | Freq: Every day | ORAL | Status: DC | PRN
  Administered 2011-12-13: 10 mg via ORAL
  Filled 2011-12-12: qty 2

## 2011-12-12 MED ORDER — SODIUM CHLORIDE 0.9 % IV SOLN: 250.0000 mL | INTRAVENOUS | Status: DC | PRN

## 2011-12-12 MED ORDER — PROMETHAZINE HCL 25 MG/ML IJ SOLN
12.5000 mg | Freq: Once | INTRAMUSCULAR | Status: AC
  Administered 2011-12-12: 12.5 mg via INTRAVENOUS
  Filled 2011-12-12: qty 1

## 2011-12-12 MED ORDER — PANTOPRAZOLE SODIUM 40 MG PO TBEC
40.0000 mg | DELAYED_RELEASE_TABLET | Freq: Every day | ORAL | Status: DC
  Administered 2011-12-13 – 2011-12-15 (×3): 40 mg via ORAL
  Filled 2011-12-12 (×3): qty 1

## 2011-12-12 MED ORDER — METOCLOPRAMIDE HCL 10 MG PO TABS
10.0000 mg | ORAL_TABLET | Freq: Three times a day (TID) | ORAL | Status: AC
  Administered 2011-12-12 – 2011-12-14 (×8): 10 mg via ORAL
  Filled 2011-12-12 (×9): qty 1

## 2011-12-12 MED ORDER — MOVING RIGHT ALONG BOOK
Freq: Once | Status: DC
  Filled 2011-12-12: qty 1

## 2011-12-12 MED ORDER — PROMETHAZINE HCL 25 MG/ML IJ SOLN: 6.2500 mg | Freq: Four times a day (QID) | INTRAMUSCULAR | Status: DC | PRN

## 2011-12-12 MED ORDER — ONDANSETRON HCL 4 MG PO TABS
4.0000 mg | ORAL_TABLET | Freq: Four times a day (QID) | ORAL | Status: DC | PRN
  Administered 2011-12-13 – 2011-12-14 (×2): 4 mg via ORAL
  Filled 2011-12-12 (×2): qty 1

## 2011-12-12 MED ORDER — ACETAMINOPHEN 325 MG PO TABS
650.0000 mg | ORAL_TABLET | Freq: Four times a day (QID) | ORAL | Status: DC | PRN
  Administered 2011-12-13 – 2011-12-14 (×2): 650 mg via ORAL
  Filled 2011-12-12 (×2): qty 2

## 2011-12-12 MED ORDER — OXYCODONE HCL 5 MG PO TABS
5.0000 mg | ORAL_TABLET | ORAL | Status: DC | PRN
  Administered 2011-12-12 – 2011-12-15 (×11): 10 mg via ORAL
  Filled 2011-12-12 (×11): qty 2

## 2011-12-12 MED ORDER — POTASSIUM CHLORIDE CRYS ER 20 MEQ PO TBCR
20.0000 meq | EXTENDED_RELEASE_TABLET | Freq: Every day | ORAL | Status: AC
  Administered 2011-12-13 – 2011-12-15 (×3): 20 meq via ORAL
  Filled 2011-12-12 (×3): qty 1

## 2011-12-12 MED ORDER — SODIUM CHLORIDE 0.9 % IJ SOLN: 3.0000 mL | INTRAMUSCULAR | Status: DC | PRN

## 2011-12-12 MED ORDER — PROMETHAZINE HCL 25 MG/ML IJ SOLN: 12.5000 mg | Freq: Four times a day (QID) | INTRAMUSCULAR | Status: DC | PRN

## 2011-12-12 MED ORDER — METOCLOPRAMIDE HCL 5 MG/ML IJ SOLN
10.0000 mg | Freq: Four times a day (QID) | INTRAMUSCULAR | Status: DC
  Administered 2011-12-12: 10 mg via INTRAVENOUS
  Filled 2011-12-12 (×4): qty 2

## 2011-12-12 MED ORDER — BISACODYL 10 MG RE SUPP: 10.0000 mg | Freq: Every day | RECTAL | Status: DC | PRN

## 2011-12-12 MED ORDER — SODIUM CHLORIDE 0.9 % IJ SOLN
3.0000 mL | Freq: Two times a day (BID) | INTRAMUSCULAR | Status: DC
  Administered 2011-12-12 – 2011-12-14 (×5): 3 mL via INTRAVENOUS

## 2011-12-12 MED ORDER — DOCUSATE SODIUM 100 MG PO CAPS
200.0000 mg | ORAL_CAPSULE | Freq: Every day | ORAL | Status: DC
  Administered 2011-12-13 – 2011-12-15 (×3): 200 mg via ORAL
  Filled 2011-12-12 (×3): qty 2

## 2011-12-12 NOTE — Progress Notes (Signed)
2 Days Post-Op Procedure(s) (LRB): CORONARY ARTERY BYPASS GRAFTING (CABG) (N/A) Subjective: Nausea this am. Better now after phenergan  Objective: Vital signs in last 24 hours: Temp:  [98.2 F (36.8 C)-99.6 F (37.6 C)] 98.3 F (36.8 C) (05/12 0734) Pulse Rate:  [94-108] 100  (05/12 1000) Cardiac Rhythm:  [-] Sinus tachycardia (05/12 0800) Resp:  [11-23] 18  (05/12 1000) BP: (96-128)/(44-78) 107/44 mmHg (05/12 1000) SpO2:  [92 %-97 %] 95 % (05/12 1000) Weight:  [95.2 kg (209 lb 14.1 oz)] 95.2 kg (209 lb 14.1 oz) (05/12 0600)  Hemodynamic parameters for last 24 hours:    Intake/Output from previous day: 05/11 0701 - 05/12 0700 In: 1418.8 [P.O.:840; I.V.:528.8; IV Piggyback:50] Out: 1175 [Urine:1095; Chest Tube:80] Intake/Output this shift: Total I/O In: 300 [P.O.:240; I.V.:10; IV Piggyback:50] Out: 170 [Urine:170]  Lungs: clear Heart:  RRR, no rub Incision ok Minimal peripheral edema  Lab Results:  Basename 12/12/11 0430 12/11/11 1724 12/11/11 1700  WBC 12.7* -- 13.3*  HGB 11.8* 12.2* --  HCT 35.0* 36.0* --  PLT 106* -- 101*   BMET:  Basename 12/12/11 0430 12/11/11 1724 12/11/11 0330  NA 136 139 --  K 4.4 4.2 --  CL 100 104 --  CO2 27 -- 23  GLUCOSE 123* 130* --  BUN 18 19 --  CREATININE 1.18 1.10 --  CALCIUM 8.7 -- 8.3*    PT/INR:  Basename 12/10/11 1340  LABPROT 15.9*  INR 1.24   ABG    Component Value Date/Time   PHART 7.311* 12/10/2011 1847   HCO3 21.7 12/10/2011 1847   TCO2 24 12/11/2011 1724   ACIDBASEDEF 4.0* 12/10/2011 1847   O2SAT 95.0 12/10/2011 1847   CBG (last 3)   Basename 12/12/11 0732 12/12/11 0404 12/12/11 0009  GLUCAP 116* 120* 123*    Assessment/Plan: S/P Procedure(s) (LRB): CORONARY ARTERY BYPASS GRAFTING (CABG) (N/A) Mobilize Diuresis Plan for transfer to step-down: see transfer orders   LOS: 6 days    Lawyer Washabaugh K 12/12/2011

## 2011-12-13 ENCOUNTER — Encounter (HOSPITAL_COMMUNITY): Payer: Self-pay | Admitting: Cardiothoracic Surgery

## 2011-12-13 LAB — GLUCOSE, CAPILLARY
Glucose-Capillary: 116 mg/dL — ABNORMAL HIGH (ref 70–99)
Glucose-Capillary: 105 mg/dL — ABNORMAL HIGH (ref 70–99)
Glucose-Capillary: 98 mg/dL (ref 70–99)
Glucose-Capillary: 94 mg/dL (ref 70–99)
Glucose-Capillary: 112 mg/dL — ABNORMAL HIGH (ref 70–99)

## 2011-12-13 LAB — BASIC METABOLIC PANEL
CO2: 30 mEq/L (ref 19–32)
Chloride: 98 mEq/L (ref 96–112)
Creatinine, Ser: 1.17 mg/dL (ref 0.50–1.35)
GFR calc Af Amer: 79 mL/min — ABNORMAL LOW (ref >90)
Potassium: 4.5 mEq/L (ref 3.5–5.1)
Sodium: 134 mEq/L — ABNORMAL LOW (ref 135–145)

## 2011-12-13 LAB — CBC
MCV: 90.4 fL (ref 78.0–100.0)
Platelets: 116 10*3/uL — ABNORMAL LOW (ref 150–400)
RBC: 3.85 MIL/uL — ABNORMAL LOW (ref 4.22–5.81)
RDW: 13.3 % (ref 11.5–15.5)
WBC: 9.8 10*3/uL (ref 4.0–10.5)

## 2011-12-13 MED ORDER — LACTULOSE 10 GM/15ML PO SOLN
20.0000 g | Freq: Once | ORAL | Status: AC
  Administered 2011-12-13: 20 g via ORAL
  Filled 2011-12-13: qty 30

## 2011-12-13 MED ORDER — METOPROLOL TARTRATE 25 MG PO TABS
25.0000 mg | ORAL_TABLET | Freq: Two times a day (BID) | ORAL | Status: DC
  Administered 2011-12-13 (×2): 25 mg via ORAL
  Filled 2011-12-13 (×4): qty 1

## 2011-12-13 MED FILL — Potassium Chloride Inj 2 mEq/ML: INTRAVENOUS | Qty: 40 | Status: AC

## 2011-12-13 MED FILL — Magnesium Sulfate Inj 50%: INTRAMUSCULAR | Qty: 10 | Status: AC

## 2011-12-13 NOTE — Progress Notes (Addendum)
3 Days Post-Op Procedure(s) (LRB): CORONARY ARTERY BYPASS GRAFTING (CABG) (N/A)  Subjective: Patient eating breakfast. Complaints of a sore throat and no bm yet.  Objective: Vital signs in last 24 hours: Patient Vitals for the past 24 hrs:  BP Temp Temp src Pulse Resp SpO2 Weight  12/13/11 0508 127/70 mmHg 98.8 F (37.1 C) Oral 109  19  95 % 207 lb 3.7 oz (94 kg)  12/12/11 2036 124/61 mmHg 98.5 F (36.9 C) Oral 100  18  96 % -  12/12/11 1814 - 99.3 F (37.4 C) Oral - - - -  12/12/11 1330 114/71 mmHg 97.9 F (36.6 C) Oral 96  16  97 % -  12/12/11 1200 122/82 mmHg - - 100  12  93 % -  12/12/11 1157 - 98 F (36.7 C) Oral - - - -  12/12/11 1100 111/68 mmHg - - 98  11  94 % -  12/12/11 1000 107/44 mmHg - - 100  18  95 % -  12/12/11 0900 100/61 mmHg - - 101  15  95 % -  12/12/11 0800 128/74 mmHg - - 102  15  95 % -  12/12/11 0734 - 98.3 F (36.8 C) Oral - - - -   Pre op weight  91 kg Current Weight  12/13/11 207 lb 3.7 oz (94 kg)      Intake/Output from previous day: 05/12 0701 - 05/13 0700 In: 562 [P.O.:480; I.V.:30; IV Piggyback:52] Out: 1510 [Urine:1510]   Physical Exam:  Cardiovascular: Tachycardic;no murmurs, gallops, or rubs. Pulmonary: Mostly clear to auscultation bilaterally; no rales, wheezes, or rhonchi. Abdomen: Soft, non tender, bowel sounds present. Extremities: Mild bilateral lower extremity edema. Wounds: Clean and dry.  No erythema or signs of infection.  Lab Results: CBC: Basename 12/13/11 0515 12/12/11 0430  WBC 9.8 12.7*  HGB 11.5* 11.8*  HCT 34.8* 35.0*  PLT 116* 106*   BMET:  Basename 12/13/11 0515 12/12/11 0430  NA 134* 136  K 4.5 4.4  CL 98 100  CO2 30 27  GLUCOSE 94 123*  BUN 18 18  CREATININE 1.17 1.18  CALCIUM 9.2 8.7    PT/INR:  Basename 12/10/11 1340  LABPROT 15.9*  INR 1.24   ABG:  INR: Will add last result for INR, ABG once components are confirmed Will add last 4 CBG results once components are  confirmed  Assessment/Plan:  1. CV - Mostly ST.Start Lopressor. 2.  Pulmonary - Encourage incentive spirometer.Wean off O2. 3. Volume Overload - Continue with diuresis. 4.  Acute blood loss anemia - H/H stable this am at 11.5/34.8. 5.Mild thrombocytopenia-Platelets increased to 116,000. 6.LOC constipation.  ZIMMERMAN,DONIELLE MPA-C 12/13/2011   I have seen and examined Michael Spears and agree with the above assessment  and plan.  Delight Ovens MD Beeper [phone] Office [phone] 12/13/2011 5:06 PM

## 2011-12-13 NOTE — Progress Notes (Signed)
Encouraged 3rd walk today for pt, however pt states he is too tired. He has made many trips to the bathroom thinking he is going to have a bowel movement with no success, so he feels that is enough walking for today.

## 2011-12-13 NOTE — Progress Notes (Signed)
CARDIAC REHAB PHASE I   PRE:  Rate/Rhythm: 118 ST  BP:  Supine:   Sitting: 122/76  Standing:    SaO2: 93 1L 91 RA  MODE:  Ambulation: 550 ft   POST:  Rate/Rhythem: 126 ST  BP:  Supine:   Sitting: 133/66  Standing:    SaO2: 93 RA 0950-1030 Assisted X 1 and used walker to ambulate. Gait steady with walker.No c /o with walking. RA sat before walk 91% after 93%. O2 left off after walk. Encouraged use of IS and staying OOB.Pt to recliner after walk with call light in reach.    Michael Spears

## 2011-12-13 NOTE — Progress Notes (Signed)
Pt ambulated 500 feet with rolling walker. Pt ambulated on room air 90%. Pt placed back into chair with callbell in reach.

## 2011-12-13 NOTE — Anesthesia Postprocedure Evaluation (Signed)
Anesthesia Post Note  Patient: Michael Spears  Procedure(s) Performed: Procedure(s) (LRB): CORONARY ARTERY BYPASS GRAFTING (CABG) (N/A)  Anesthesia type: General  Patient location: ICU  Post pain: Pain level controlled  Post assessment: Post-op Vital signs reviewed  Last Vitals:  Filed Vitals:   12/13/11 0508  BP: 127/70  Pulse: 109  Temp: 37.1 C  Resp: 19    Post vital signs: stable  Level of consciousness: Patient remains intubated per anesthesia plan  Complications: No apparent anesthesia complications

## 2011-12-13 NOTE — H&P (Signed)
NAME: Michael Spears ACCOUNT NO.: [id number]  MEDICAL RECORD NO.: [id number]  LOCATION: 2016 FACILITY: [facility]  PHYSICIAN: Mikeyla Music, MD DATE OF BIRTH: 04/06/1956  DATE OF ADMISSION: 12/06/2011  DATE OF DISCHARGE:  Operative Report  PREOPERATIVE DIAGNOSIS: Coronary occlusive disease.  POSTOPERATIVE DIAGNOSIS: Coronary occlusive disease.  PROCEDURE: Coronary artery bypass grafting x4 with left internal  mammary to the left anterior descending coronary artery, sequential  reverse saphenous vein graft to the first and second obtuse marginal,  reverse saphenous vein graft to the posterior descending coronary artery  with right leg, thigh, and calf endovein harvesting.  SURGEON: Milagros Middendorf, M.D.  FIRST ASSISTANT: Erin Barrett P.A.  BRIEF HISTORY: The patient is a 55-year-old male who presented with new  onset of anginal symptoms. Because of his unstable anginal symptoms, he  underwent coronary artery bypass graft by Dr. Michael Horney, which  demonstrated total occlusion of the distal right coronary artery,  subtotal occlusion of the LAD after the takeoff of the diagonal, and 70-  80% disease in the first and second obtuse marginal. The first being  smaller than the second. Because of his symptoms and three-vessel  coronary artery disease, coronary artery bypass grafting was recommended  to the patient who agreed and signed the informed consent.  DESCRIPTION OF PROCEDURE: With Swan-Ganz arterial line monitors in  place, the patient underwent general endotracheal anesthesia without  incident. Skin of the chest and legs was prepped with Betadine and  draped in usual sterile manner. Using the Guidant endovein harvesting  system, vein was harvested from the right thigh and calf, and was of  good quality and caliber. Median sternotomy was performed. Left  internal mammary artery was dissected down as pedicle graft. Distal  artery was divided, had good free flow. Pericardium was opened.    Overall ventricular function appeared preserved. The patient was  systemically heparinized. The ascending aorta was cannulated. The  right atrium was cannulated and aortic root vent cardioplegia needle was  introduced into the ascending aorta. The patient was placed on  cardiopulmonary bypass 2.4 L/minute/m square. Sites of anastomosis were  selected and dissected out of the epicardium. The patient's body  temperature was cooled to 32 degrees. Aortic crossclamp was applied and  600 mL of cold blood potassium cardioplegia was administered with  diastolic arrest of the heart. Myocardial septal temperatures monitored  throughout the crossclamp. Attention was turned first to the posterior  descending coronary artery. This vessel was opened and was a relatively  small vessel and diffusely diseased. The distal anastomosis was  performed with a 7-0 Prolene and we were not satisfied with the flow in  this vessel and anastomosis was taken down, and the arteriotomy was  extended. The vessel was approximately 1.2 mm in size. Anastomosis was  redone with a running 8-0 Prolene and was much more satisfactory, and  additional cold blood cardioplegia was administered down the vein graft.  Attention was then turned to the first and second obtuse marginal. The  first obtuse marginal was a smaller vessel, admitted for approximately  1.2 mm. Using a running 8-0 Prolene, a side-to-side anastomosis was  performed with second reverse saphenous vein graft. The distal extent  of the same vein was then carried short distance to the second obtuse  marginal, which was a larger vessel. The vessel had significant  proximal disease at the site of anastomosis. The vessel was of  reasonable in both size and quality, admitting a 1.5-mm probe distally.  Using a running 7-0   Prolene, distal anastomosis was performed.  Attention was then turned to the left anterior descending coronary  artery, which was opened between the  mid and distal third. Using a  running 8-0 Prolene, the left internal mammary artery was anastomosed to  left anterior descending coronary artery. With release of the bulldog  on the mammary artery, there was prompt rise in myocardial septal  temperature. Bulldog was placed back on the mammary artery. With the  crossclamp still in place, 2-punch aortotomies were performed. Each of  the 2 vein grafts were anastomosed to the ascending aorta. The bulldog  was removed from the mammary artery with rise in myocardial septal  temperature. Heart was allowed to passively fill and the ascending  aorta was de-aired. Aortotomies were completed and aortic crossclamp  was removed, total crossclamp time of 108 minutes. The patient required  atrial and ventricular pacing temporarily, but ultimately returned to a  sinus rhythm. With the patient's body temperature rewarmed to 37  degrees, he was then ventilated and weaned from cardiopulmonary bypass  without difficulty. Decannulated in usual fashion. Protamine sulfate  was administered. With operative field hemostatic, the pericardium was  loosely reapproximated. The left pleural tube and Blake mediastinal  drain were left in place. Sternum was closed with 6 stainless steel  wire. Fascia was closed with interrupted 0 Vicryl, running 3-0 Vicryl  in subcutaneous tissue, and 4-0 subcuticular stitch in skin edges. Dry  dressings were applied. Sponge and needle count was reported as correct  at completion of the procedure. The patient tolerated the procedure  without obvious complication and was transferred to Surgical Intensive  Care Unit for further postoperative care. The patient did not require  any blood bank blood products during the operative procedure.  Quinnley Colasurdo, MD  EG/MEDQ D: 12/13/2011 T: 12/13/2011 Job: [id number]  

## 2011-12-14 MED ORDER — POLYETHYLENE GLYCOL 3350 17 G PO PACK
17.0000 g | PACK | Freq: Every day | ORAL | Status: DC
  Administered 2011-12-14 – 2011-12-15 (×2): 17 g via ORAL
  Filled 2011-12-14 (×2): qty 1

## 2011-12-14 MED ORDER — OXYCODONE HCL 5 MG PO TABS: 5.0000 mg | ORAL_TABLET | ORAL | Status: AC | PRN

## 2011-12-14 MED ORDER — FUROSEMIDE 40 MG PO TABS: 40.0000 mg | ORAL_TABLET | Freq: Every day | ORAL | Status: DC

## 2011-12-14 MED ORDER — METOPROLOL TARTRATE 25 MG PO TABS
25.0000 mg | ORAL_TABLET | Freq: Three times a day (TID) | ORAL | Status: DC
  Administered 2011-12-14 – 2011-12-15 (×4): 25 mg via ORAL
  Filled 2011-12-14 (×6): qty 1

## 2011-12-14 MED ORDER — ASPIRIN 325 MG PO TBEC: 325.0000 mg | DELAYED_RELEASE_TABLET | Freq: Every day | ORAL | Status: AC

## 2011-12-14 MED ORDER — METOPROLOL TARTRATE 25 MG PO TABS: 25.0000 mg | ORAL_TABLET | Freq: Three times a day (TID) | ORAL | Status: DC

## 2011-12-14 MED ORDER — POTASSIUM CHLORIDE CRYS ER 20 MEQ PO TBCR: 20.0000 meq | EXTENDED_RELEASE_TABLET | Freq: Every day | ORAL | Status: DC

## 2011-12-14 MED FILL — Mannitol IV Soln 20%: INTRAVENOUS | Qty: 500 | Status: AC

## 2011-12-14 MED FILL — Electrolyte-R (PH 7.4) Solution: INTRAVENOUS | Qty: 4000 | Status: AC

## 2011-12-14 MED FILL — Sodium Chloride IV Soln 0.9%: INTRAVENOUS | Qty: 1000 | Status: AC

## 2011-12-14 MED FILL — Heparin Sodium (Porcine) Inj 1000 Unit/ML: INTRAMUSCULAR | Qty: 30 | Status: AC

## 2011-12-14 MED FILL — Sodium Chloride Irrigation Soln 0.9%: Qty: 3000 | Status: AC

## 2011-12-14 MED FILL — Lidocaine HCl IV Inj 20 MG/ML: INTRAVENOUS | Qty: 10 | Status: AC

## 2011-12-14 MED FILL — Sodium Bicarbonate IV Soln 8.4%: INTRAVENOUS | Qty: 50 | Status: AC

## 2011-12-14 NOTE — Progress Notes (Signed)
Removed pt's EPW per order. Pt has experienced no arrhythmias overnight and  Last INR is WNL. Pt tolerated well. On bedrest for one hour with frequent vitals.

## 2011-12-14 NOTE — Discharge Summary (Signed)
[address]            [place] [address]          [phone]         Discharge Summary  Name: Michael Spears DOB: 1956/07/30 56 y.o. MRN: [id number]  Admission Date: 12/06/2011 Discharge Date:    Admitting Diagnosis:  Chest pain  Discharge Diagnosis:   Severe three-vessel coronary artery disease  Unstable angina  History of tobacco use  Procedures: Procedure(s): CORONARY ARTERY BYPASS GRAFTING (CABG) x 4 (left internal mammary artery to the LAD, sequential saphenous vein graft to first and second obtuse marginals, saphenous vein graft to the posterior descending coronary) endoscopic vein harvest right leg- on  12/10/2011   HPI:  The patient is a 56 y.o. male with no significant past medical history who presented to the emergency department on the date of admission for evaluation of chest pain. His symptoms had been present intermittently for about 4 days. The pain was worse with exertion and relieved with rest. He had some associated nausea and fatigue, but denies diaphoresis or dyspnea. His initial EKG and cardiac enzymes were negative, however he was admitted for further evaluation and treatment.   Hospital Course:  The patient was admitted to [facility] on 12/06/2011. He ruled out for an acute myocardial infarction. He was seen in consultation by [facility], and cardiac catheterization was recommended. This was performed on 12/07/2011 by Dr. [name], and revealed severe three-vessel coronary artery disease with well-preserved left ventricular systolic function. Because of his severe three-vessel disease and unstable angina type symptoms, a cardiac surgery consult was requested. The patient was seen by Dr. Tyrone Sage and his films were reviewed. Dr. Tyrone Sage agreed with the need for surgical revascularization.  All risks, benefits and alternatives of surgery were explained in detail, and the patient agreed to proceed. The patient was  taken to the operating room and underwent the above procedure.    The postoperative course has generally been uneventful. He was able to be transferred to the step down unit on postop day 2. He was started on a low-dose diuretic for mild postoperative volume overload. He has remained in sinus rhythm and his vital signs have been stable. He is tolerating a regular diet. He is ambulating with cardiac rehabilitation phase 1 is progressing well with mobility. We anticipate discharge home within the next 24-48 hours provided he continues to progress well and no acute changes occur.   Recent vital signs:  Filed Vitals:   12/14/11 0550  BP: 119/74  Pulse: 119  Temp:   Resp: 19    Recent laboratory studies:  CBC: Basename 12/13/11 0515 12/12/11 0430  WBC 9.8 12.7*  HGB 11.5* 11.8*  HCT 34.8* 35.0*  PLT 116* 106*   BMET:  Basename 12/13/11 0515 12/12/11 0430  NA 134* 136  K 4.5 4.4  CL 98 100  CO2 30 27  GLUCOSE 94 123*  BUN 18 18  CREATININE 1.17 1.18  CALCIUM 9.2 8.7    PT/INR: No results found for this basename: LABPROT,INR in the last 72 hours  Discharge Medications:   Medication List  As of 12/14/2011 10:19 AM   STOP taking these medications         EXCEDRIN BACK & BODY PO      metoprolol succinate 50 MG 24 hr tablet  TAKE these medications         aspirin 325 MG EC tablet   Take 1 tablet (325 mg total) by mouth daily.      furosemide 40 MG tablet   Commonly known as: LASIX   Take 1 tablet (40 mg total) by mouth daily.      metoprolol tartrate 25 MG tablet   Commonly known as: LOPRESSOR   Take 1 tablet (25 mg total) by mouth 3 (three) times daily.      oxyCODONE 5 MG immediate release tablet   Commonly known as: Oxy IR/ROXICODONE   Take 1-2 tablets (5-10 mg total) by mouth every 3 (three) hours as needed for pain.      potassium chloride SA 20 MEQ tablet   Commonly known as: K-DUR,KLOR-CON   Take 1 tablet (20 mEq total) by mouth daily.             Discharge Instructions:  The patient is to refrain from driving, heavy lifting or strenuous activity.  May shower daily and clean incisions with soap and water.  May resume regular diet.  Discharge Orders    Future Appointments: Provider: Department: Dept Phone: Center:   01/13/2012 3:00 PM Delight Ovens, MD [facility] [phone] [id number]      Follow-up Information    Follow up with GERHARDT,EDWARD B, MD on 01/13/2012. (Have a chest x-ray at 2:00, then see MD at 3:00)    Contact information:   [address] [place] [address] [phone]       Follow up with Verne Carrow, MD. Schedule an appointment as soon as possible for a visit in 2 weeks.   Contact information:   [facility] [address] [place] [address] [phone]           Adella Hare 12/14/2011, 10:19 AM

## 2011-12-14 NOTE — Progress Notes (Signed)
CARDIAC REHAB PHASE I   PRE:  Rate/Rhythm: 106 ST    BP: sitting 120/62    SaO2: 92 RA  MODE:  Ambulation: 550 ft   POST:  Rate/Rhythm: 121 ST    BP: sitting 150/76     SaO2: 91 RA  Tolerated fairly well. Sts some SOB and fatigue. Did not need RW. HR up and SaO2 borderline. Encouraged IS more. To recliner after walk.  [id number]  Harriet Masson CES, ACSM

## 2011-12-14 NOTE — Progress Notes (Signed)
Pt walked without rolling walker approx 900 feet. Tolerated well, no SOB. Pt is feeling much stronger today

## 2011-12-14 NOTE — Anesthesia Postprocedure Evaluation (Signed)
  Anesthesia Post-op Note  Patient: Michael Spears  Procedure(s) Performed: Procedure(s) (LRB): CORONARY ARTERY BYPASS GRAFTING (CABG) (N/A)  Patient Location: [id number]  Anesthesia Type: General  Level of Consciousness: awake, alert , oriented and patient cooperative  Airway and Oxygen Therapy: Patient Spontanous Breathing  Post-op Pain: moderate  Post-op Assessment: Post-op Vital signs reviewed, Patient's Cardiovascular Status Stable, Respiratory Function Stable, Patent Airway, No signs of Nausea or vomiting, Adequate PO intake and Pain level controlled  Post-op Vital Signs: Reviewed and stable  Complications: No apparent anesthesia complications and Sore throat started 2 days post-op

## 2011-12-14 NOTE — Addendum Note (Signed)
Addendum  created 12/14/11 1033 by Marni Griffon, CRNA   Modules edited:Notes Section

## 2011-12-14 NOTE — Progress Notes (Signed)
Pt ambulated approx. 500 feet with nurse tech and no rolling walker. Pt was on room air and tolerated well

## 2011-12-14 NOTE — Progress Notes (Signed)
4 Days Post-Op Procedure(s) (LRB): CORONARY ARTERY BYPASS GRAFTING (CABG) (N/A)  Subjective: Passing a lot of flatus but still no bowel movement yet. Felt a popping sensation to the right of the sternum one time yesterday.  Objective: Vital signs in last 24 hours: Patient Vitals for the past 24 hrs:  BP Temp Temp src Pulse Resp SpO2 Weight  12/14/11 0550 119/74 mmHg - Oral 119  19  - 203 lb 14.8 oz (92.5 kg)  12/13/11 2042 118/60 mmHg 98.2 F (36.8 C) Oral 114  18  90 % -  12/13/11 1336 116/55 mmHg 98.9 F (37.2 C) - 109  18  92 % -   Pre op weight  91 kg Current Weight  12/14/11 203 lb 14.8 oz (92.5 kg)      Intake/Output from previous day: 05/13 0701 - 05/14 0700 In: 680 [P.O.:680] Out: 750 [Urine:750]   Physical Exam:  Cardiovascular: Slightly tachycardic;no murmurs, gallops, or rubs. Pulmonary: Mostly clear to auscultation on right;slightly diminished at left base; no rales, wheezes, or rhonchi. Abdomen: Soft, non tender, bowel sounds present. Extremities: Mild bilateral lower extremity edema. Wounds: Clean and dry.  No erythema or signs of infection. Sternum is solid;no instability. Wires are all intact as seen on CXR 5/12.  Lab Results: CBC:  Basename 12/13/11 0515 12/12/11 0430  WBC 9.8 12.7*  HGB 11.5* 11.8*  HCT 34.8* 35.0*  PLT 116* 106*   BMET:   Basename 12/13/11 0515 12/12/11 0430  NA 134* 136  K 4.5 4.4  CL 98 100  CO2 30 27  GLUCOSE 94 123*  BUN 18 18  CREATININE 1.17 1.18  CALCIUM 9.2 8.7    PT/INR: No results found for this basename: LABPROT,INR in the last 72 hours ABG:  INR: Will add last result for INR, ABG once components are confirmed Will add last 4 CBG results once components are confirmed  Assessment/Plan:  1. CV - Mostly ST.Increase Lopressor 25 tid. 2.  Pulmonary - Encourage incentive spirometer. 3. Volume Overload - Continue with diuresis. 4.  Acute blood loss anemia - H/H stable this am at 11.5/34.8. 5.Mild  thrombocytopenia-Platelets increased to 116,000. 6.Miralax for constipation;suppository if this does not relieve him 7.No evidence of sternal instability;popping he felt likely secondary to rib disruption from retractor. 8.Remove EPW. 9.Possible discharge in 1-2 days.  Michael Spears MPA-C 12/14/2011 7:07 AM

## 2011-12-15 MED ORDER — BISACODYL 10 MG RE SUPP
10.0000 mg | Freq: Once | RECTAL | Status: AC
  Administered 2011-12-15: 10 mg via RECTAL
  Filled 2011-12-15: qty 1

## 2011-12-15 NOTE — Progress Notes (Signed)
Pt discharge instructions and patient education complete. IV site d/c. Site WNL. No further questions. D/C home with family via  wheelchair. Carmen Vallecillo Taylor  

## 2011-12-15 NOTE — Progress Notes (Addendum)
5 Days Post-Op Procedure(s) (LRB): CORONARY ARTERY BYPASS GRAFTING (CABG) (N/A)  Subjective: Still passing flatus but no bowel movement yet.  Objective: Vital signs in last 24 hours: Patient Vitals for the past 24 hrs:  BP Temp Temp src Pulse Resp SpO2 Weight  12/15/11 0545 129/77 mmHg 98.6 F (37 C) Oral 97  20  94 % 203 lb 1.6 oz (92.126 kg)  12/14/11 2105 125/72 mmHg 98.5 F (36.9 C) Oral 100  20  91 % -  12/14/11 1352 106/73 mmHg 98.3 F (36.8 C) Oral 110  19  92 % -   Pre op weight  91 kg Current Weight  12/15/11 203 lb 1.6 oz (92.126 kg)      Intake/Output from previous day: 05/14 0701 - 05/15 0700 In: 440 [P.O.:440] Out: 200 [Urine:200]   Physical Exam:  Cardiovascular: RRR;no murmurs, gallops, or rubs. Pulmonary: Mostly clear to auscultation on right;slightly diminished at left base; no rales, wheezes, or rhonchi. Abdomen: Soft, non tender, bowel sounds present. Extremities: Mild bilateral lower extremity edema.Outer forearm of left upper extremity has a small area of swelling. Not tender and little erythema or warmth (infiltrated IV site) Wounds: Clean and dry.  No erythema or signs of infection. Sternum is solid;no instability. Wires are all intact as seen on CXR 5/12.  Lab Results: CBC:  Basename 12/13/11 0515  WBC 9.8  HGB 11.5*  HCT 34.8*  PLT 116*   BMET:   Basename 12/13/11 0515  NA 134*  K 4.5  CL 98  CO2 30  GLUCOSE 94  BUN 18  CREATININE 1.17  CALCIUM 9.2    PT/INR: No results found for this basename: LABPROT,INR in the last 72 hours ABG:  INR: Will add last result for INR, ABG once components are confirmed Will add last 4 CBG results once components are confirmed  Assessment/Plan:  1. CV - SR.Continue Lopressor 25 tid. 2.  Pulmonary - Encourage incentive spirometer. 3. Volume Overload - Continue with diuresis. 4.  Acute blood loss anemia - H/H stable this am at 11.5/34.8. 5.Mild thrombocytopenia-Platelets increased to  116,000. 6.Ifniltrated IV site outer left forearm.Doubt need for antibiotics at this time. Warm compress. 7.Suppository. 8.Probable discharge later today.   Carie Kapuscinski MPA-C 12/15/2011 7:24 AM

## 2011-12-15 NOTE — Progress Notes (Signed)
Addendum Note to D/C LARRON ARMOR is a 56 y.o. male who is S/P Procedure(s): CORONARY ARTERY BYPASS GRAFTING (CABG). The patient is stable and ready for discharge.  No changes to History, Meds or D/C instructions. The only change on physical exam was phlebitis of the outer left forearm (secondary to infiltrated IV site). There is no warmth or erythema so no need for antibiotics at this time. Patient instructed to place warm compress PRN and if he develops warmth and or erythema, to call office.  Niall Illes M 12/15/2011 11:41 AM

## 2011-12-15 NOTE — Progress Notes (Signed)
Pt had bowel movement after administration of suppository. PA okay to d/c pt to home after BM. Dion Saucier

## 2011-12-15 NOTE — Progress Notes (Signed)
Ed completed and pt voiced understanding.  [id number] Michael Spears CES, ACSM

## 2011-12-16 NOTE — Op Note (Signed)
NAMEORVELL, CAREAGA NO.: [id number]  MEDICAL RECORD NO.: [id number]  LOCATION: 2016 FACILITY: [facility]  PHYSICIAN: Sheliah Plane, MD DATE OF BIRTH: 02-27-1956  DATE OF ADMISSION: 12/06/2011  DATE OF DISCHARGE:  Operative Report  PREOPERATIVE DIAGNOSIS: Coronary occlusive disease.  POSTOPERATIVE DIAGNOSIS: Coronary occlusive disease.  PROCEDURE: Coronary artery bypass grafting x4 with left internal  mammary to the left anterior descending coronary artery, sequential  reverse saphenous vein graft to the first and second obtuse marginal,  reverse saphenous vein graft to the posterior descending coronary artery  with right leg, thigh, and calf endovein harvesting.  SURGEON: Sheliah Plane, M.D.  FIRST ASSISTANT: Erin Barrett P.A.  BRIEF HISTORY: The patient is a 56 year old male who presented with new  onset of anginal symptoms. Because of his unstable anginal symptoms, he  underwent coronary artery bypass graft by Dr. Lowry Bowl, which  demonstrated total occlusion of the distal right coronary artery,  subtotal occlusion of the LAD after the takeoff of the diagonal, and 70-  80% disease in the first and second obtuse marginal. The first being  smaller than the second. Because of his symptoms and three-vessel  coronary artery disease, coronary artery bypass grafting was recommended  to the patient who agreed and signed the informed consent.  DESCRIPTION OF PROCEDURE: With Swan-Ganz arterial line monitors in  place, the patient underwent general endotracheal anesthesia without  incident. Skin of the chest and legs was prepped with Betadine and  draped in usual sterile manner. Using the [employer] endovein harvesting  system, vein was harvested from the right thigh and calf, and was of  good quality and caliber. Median sternotomy was performed. Left  internal mammary artery was dissected down as pedicle graft. Distal  artery was divided, had good free flow. Pericardium was opened.    Overall ventricular function appeared preserved. The patient was  systemically heparinized. The ascending aorta was cannulated. The  right atrium was cannulated and aortic root vent cardioplegia needle was  introduced into the ascending aorta. The patient was placed on  cardiopulmonary bypass 2.4 L/minute/m square. Sites of anastomosis were  selected and dissected out of the epicardium. The patient's body  temperature was cooled to 32 degrees. Aortic crossclamp was applied and  600 mL of cold blood potassium cardioplegia was administered with  diastolic arrest of the heart. Myocardial septal temperatures monitored  throughout the crossclamp. Attention was turned first to the posterior  descending coronary artery. This vessel was opened and was a relatively  small vessel and diffusely diseased. The distal anastomosis was  performed with a 7-0 Prolene and we were not satisfied with the flow in  this vessel and anastomosis was taken down, and the arteriotomy was  extended. The vessel was approximately 1.2 mm in size. Anastomosis was  redone with a running 8-0 Prolene and was much more satisfactory, and  additional cold blood cardioplegia was administered down the vein graft.  Attention was then turned to the first and second obtuse marginal. The  first obtuse marginal was a smaller vessel, admitted for approximately  1.2 mm. Using a running 8-0 Prolene, a side-to-side anastomosis was  performed with second reverse saphenous vein graft. The distal extent  of the same vein was then carried short distance to the second obtuse  marginal, which was a larger vessel. The vessel had significant  proximal disease at the site of anastomosis. The vessel was of  reasonable in both size and quality, admitting a 1.5-mm probe distally.  Using a running 7-0  Prolene, distal anastomosis was performed.  Attention was then turned to the left anterior descending coronary  artery, which was opened between the  mid and distal third. Using a  running 8-0 Prolene, the left internal mammary artery was anastomosed to  left anterior descending coronary artery. With release of the bulldog  on the mammary artery, there was prompt rise in myocardial septal  temperature. Bulldog was placed back on the mammary artery. With the  crossclamp still in place, 2-punch aortotomies were performed. Each of  the 2 vein grafts were anastomosed to the ascending aorta. The bulldog  was removed from the mammary artery with rise in myocardial septal  temperature. Heart was allowed to passively fill and the ascending  aorta was de-aired. Aortotomies were completed and aortic crossclamp  was removed, total crossclamp time of 108 minutes. The patient required  atrial and ventricular pacing temporarily, but ultimately returned to a  sinus rhythm. With the patient's body temperature rewarmed to 37  degrees, he was then ventilated and weaned from cardiopulmonary bypass  without difficulty. Decannulated in usual fashion. Protamine sulfate  was administered. With operative field hemostatic, the pericardium was  loosely reapproximated. The left pleural tube and Blake mediastinal  drain were left in place. Sternum was closed with 6 stainless steel  wire. Fascia was closed with interrupted 0 Vicryl, running 3-0 Vicryl  in subcutaneous tissue, and 4-0 subcuticular stitch in skin edges. Dry  dressings were applied. Sponge and needle count was reported as correct  at completion of the procedure. The patient tolerated the procedure  without obvious complication and was transferred to Surgical Intensive  Care Unit for further postoperative care. The patient did not require  any blood bank blood products during the operative procedure.  Sheliah Plane, MD  EG/MEDQ D: 12/13/2011 T: 12/13/2011 Job: [id number]

## 2011-12-17 ENCOUNTER — Other Ambulatory Visit: Payer: Self-pay | Admitting: *Deleted

## 2011-12-17 DIAGNOSIS — G8918 Other acute postprocedural pain: Secondary | ICD-10-CM

## 2011-12-17 MED ORDER — HYDROCODONE-ACETAMINOPHEN 7.5-500 MG PO TABS: 1.0000 | ORAL_TABLET | Freq: Four times a day (QID) | ORAL | Status: DC | PRN

## 2011-12-22 ENCOUNTER — Other Ambulatory Visit: Payer: Self-pay | Admitting: *Deleted

## 2011-12-22 DIAGNOSIS — G8918 Other acute postprocedural pain: Secondary | ICD-10-CM

## 2011-12-22 MED ORDER — HYDROCODONE-ACETAMINOPHEN 7.5-500 MG PO TABS: 1.0000 | ORAL_TABLET | Freq: Four times a day (QID) | ORAL | Status: DC | PRN

## 2011-12-29 ENCOUNTER — Emergency Department (HOSPITAL_COMMUNITY): Payer: PRIVATE HEALTH INSURANCE

## 2011-12-29 ENCOUNTER — Inpatient Hospital Stay (HOSPITAL_COMMUNITY): Payer: PRIVATE HEALTH INSURANCE

## 2011-12-29 ENCOUNTER — Encounter (HOSPITAL_COMMUNITY): Payer: Self-pay | Admitting: *Deleted

## 2011-12-29 ENCOUNTER — Inpatient Hospital Stay (HOSPITAL_COMMUNITY)
Admission: EM | Admit: 2011-12-29 | Discharge: 2011-12-30 | DRG: 314 | Disposition: A | Discharge status: Home / Self Care | Payer: PRIVATE HEALTH INSURANCE | Source: Ambulatory Visit | Attending: Cardiovascular Disease | Admitting: Cardiovascular Disease

## 2011-12-29 DIAGNOSIS — R0600 Dyspnea, unspecified: Secondary | ICD-10-CM | POA: Diagnosis present

## 2011-12-29 DIAGNOSIS — I313 Pericardial effusion (noninflammatory): Secondary | ICD-10-CM | POA: Insufficient documentation

## 2011-12-29 DIAGNOSIS — G8929 Other chronic pain: Secondary | ICD-10-CM | POA: Diagnosis present

## 2011-12-29 DIAGNOSIS — I319 Disease of pericardium, unspecified: Secondary | ICD-10-CM | POA: Diagnosis present

## 2011-12-29 DIAGNOSIS — R0609 Other forms of dyspnea: Secondary | ICD-10-CM | POA: Diagnosis present

## 2011-12-29 DIAGNOSIS — J9600 Acute respiratory failure, unspecified whether with hypoxia or hypercapnia: Secondary | ICD-10-CM | POA: Diagnosis present

## 2011-12-29 DIAGNOSIS — R0602 Shortness of breath: Secondary | ICD-10-CM

## 2011-12-29 DIAGNOSIS — Z7982 Long term (current) use of aspirin: Secondary | ICD-10-CM

## 2011-12-29 DIAGNOSIS — Z87891 Personal history of nicotine dependence: Secondary | ICD-10-CM

## 2011-12-29 DIAGNOSIS — Z79899 Other long term (current) drug therapy: Secondary | ICD-10-CM

## 2011-12-29 DIAGNOSIS — Y832 Surgical operation with anastomosis, bypass or graft as the cause of abnormal reaction of the patient, or of later complication, without mention of misadventure at the time of the procedure: Secondary | ICD-10-CM | POA: Diagnosis present

## 2011-12-29 DIAGNOSIS — Z951 Presence of aortocoronary bypass graft: Secondary | ICD-10-CM

## 2011-12-29 DIAGNOSIS — IMO0001 Reserved for inherently not codable concepts without codable children: Secondary | ICD-10-CM | POA: Diagnosis present

## 2011-12-29 DIAGNOSIS — R9431 Abnormal electrocardiogram [ECG] [EKG]: Secondary | ICD-10-CM

## 2011-12-29 DIAGNOSIS — M542 Cervicalgia: Secondary | ICD-10-CM | POA: Diagnosis present

## 2011-12-29 DIAGNOSIS — I3139 Other pericardial effusion (noninflammatory): Secondary | ICD-10-CM | POA: Insufficient documentation

## 2011-12-29 DIAGNOSIS — I2510 Atherosclerotic heart disease of native coronary artery without angina pectoris: Secondary | ICD-10-CM | POA: Diagnosis present

## 2011-12-29 DIAGNOSIS — E785 Hyperlipidemia, unspecified: Secondary | ICD-10-CM | POA: Diagnosis present

## 2011-12-29 DIAGNOSIS — T82897A Other specified complication of cardiac prosthetic devices, implants and grafts, initial encounter: Principal | ICD-10-CM | POA: Diagnosis present

## 2011-12-29 HISTORY — DX: Cervicalgia: M54.2

## 2011-12-29 HISTORY — DX: Pericardial effusion (noninflammatory): I31.3

## 2011-12-29 HISTORY — DX: Hyperlipidemia, unspecified: E78.5

## 2011-12-29 HISTORY — DX: Atherosclerotic heart disease of native coronary artery without angina pectoris: I25.10

## 2011-12-29 HISTORY — DX: Personal history of nicotine dependence: Z87.891

## 2011-12-29 HISTORY — DX: Other chronic pain: G89.29

## 2011-12-29 HISTORY — DX: Headache: R51

## 2011-12-29 HISTORY — DX: Other pericardial effusion (noninflammatory): I31.39

## 2011-12-29 HISTORY — DX: Fibromyalgia: M79.7

## 2011-12-29 LAB — POCT I-STAT, CHEM 8
Chloride: 104 mEq/L (ref 96–112)
Creatinine, Ser: 1.1 mg/dL (ref 0.50–1.35)
Glucose, Bld: 83 mg/dL (ref 70–99)
Potassium: 4.7 mEq/L (ref 3.5–5.1)

## 2011-12-29 LAB — CARDIAC PANEL(CRET KIN+CKTOT+MB+TROPI)
CK, MB: 2 ng/mL (ref 0.3–4.0)
Relative Index: INVALID (ref 0.0–2.5)
Relative Index: INVALID (ref 0.0–2.5)
Total CK: 58 U/L (ref 7–232)
Troponin I: <0.3 ng/mL (ref <0.30)
Troponin I: <0.3 ng/mL (ref <0.30)

## 2011-12-29 LAB — BASIC METABOLIC PANEL
BUN: 20 mg/dL (ref 6–23)
Calcium: 10.3 mg/dL (ref 8.4–10.5)
Creatinine, Ser: 1.07 mg/dL (ref 0.50–1.35)
GFR calc Af Amer: 88 mL/min — ABNORMAL LOW (ref >90)
GFR calc non Af Amer: 76 mL/min — ABNORMAL LOW (ref >90)

## 2011-12-29 LAB — CBC
HCT: 37.8 % — ABNORMAL LOW (ref 39.0–52.0)
Hemoglobin: 12.9 g/dL — ABNORMAL LOW (ref 13.0–17.0)
MCH: 29.1 pg (ref 26.0–34.0)
MCHC: 34.1 g/dL (ref 30.0–36.0)

## 2011-12-29 LAB — DIFFERENTIAL
Basophils Relative: 0 % (ref 0–1)
Eosinophils Absolute: 0.2 10*3/uL (ref 0.0–0.7)
Eosinophils Relative: 3 % (ref 0–5)
Monocytes Absolute: 0.6 10*3/uL (ref 0.1–1.0)
Monocytes Relative: 8 % (ref 3–12)
Neutrophils Relative %: 58 % (ref 43–77)

## 2011-12-29 LAB — POCT I-STAT TROPONIN I

## 2011-12-29 MED ORDER — HYDROCODONE-ACETAMINOPHEN 5-325 MG PO TABS: 1.0000 | ORAL_TABLET | ORAL | Status: DC | PRN

## 2011-12-29 MED ORDER — BISACODYL 5 MG PO TBEC: 10.0000 mg | DELAYED_RELEASE_TABLET | Freq: Every day | ORAL | Status: DC | PRN

## 2011-12-29 MED ORDER — METOPROLOL TARTRATE 25 MG PO TABS
25.0000 mg | ORAL_TABLET | Freq: Three times a day (TID) | ORAL | Status: DC
  Administered 2011-12-29 (×3): 25 mg via ORAL
  Filled 2011-12-29 (×6): qty 1

## 2011-12-29 MED ORDER — ASPIRIN 81 MG PO CHEW
324.0000 mg | CHEWABLE_TABLET | Freq: Once | ORAL | Status: AC
  Administered 2011-12-29: 324 mg via ORAL
  Filled 2011-12-29: qty 4

## 2011-12-29 MED ORDER — SODIUM CHLORIDE 0.9 % IJ SOLN
3.0000 mL | Freq: Two times a day (BID) | INTRAMUSCULAR | Status: DC
  Administered 2011-12-29 (×2): 3 mL via INTRAVENOUS

## 2011-12-29 MED ORDER — HEPARIN SODIUM (PORCINE) 5000 UNIT/ML IJ SOLN
5000.0000 [IU] | Freq: Three times a day (TID) | INTRAMUSCULAR | Status: DC
  Administered 2011-12-29 – 2011-12-30 (×4): 5000 [IU] via SUBCUTANEOUS
  Filled 2011-12-29 (×7): qty 1

## 2011-12-29 MED ORDER — ASPIRIN EC 325 MG PO TBEC
325.0000 mg | DELAYED_RELEASE_TABLET | Freq: Every day | ORAL | Status: DC
  Administered 2011-12-29: 325 mg via ORAL
  Filled 2011-12-29 (×2): qty 1

## 2011-12-29 MED ORDER — SODIUM CHLORIDE 0.9 % IV SOLN
Freq: Once | INTRAVENOUS | Status: AC
  Administered 2011-12-29: 01:00:00 via INTRAVENOUS

## 2011-12-29 MED ORDER — SODIUM CHLORIDE 0.9 % IV SOLN
INTRAVENOUS | Status: AC
  Administered 2011-12-29: 06:00:00 via INTRAVENOUS

## 2011-12-29 MED ORDER — IOHEXOL 350 MG/ML SOLN
100.0000 mL | Freq: Once | INTRAVENOUS | Status: AC | PRN
  Administered 2011-12-29: 100 mL via INTRAVENOUS

## 2011-12-29 NOTE — ED Notes (Signed)
Called and gave report to [place].

## 2011-12-29 NOTE — ED Provider Notes (Signed)
History     CSN: [id number]  Arrival date & time 12/29/11  0002   First MD Initiated Contact with Patient 12/29/11 0032      Chief Complaint  Patient presents with  . Shortness of Breath    (Consider location/radiation/quality/duration/timing/severity/associated sxs/prior treatment) HPI Comments: 56 y/o male with recent history of CABG approximately 2 weeks ago - he presents tonight because of acute onset of shortness of breath which started approximately 2 and half hours ago. The symptoms came on while he was at rest, or severe and he had to sit down to catch his breath. He still feels a mild shortness of breath at this time. He admits to having slight right leg swelling, this is the leg where he had his graft harvest for the bypass. He denies chest pain, back pain, headache, cough, fever. The symptoms are persistent and are currently mild.  Patient is a 56 y.o. male presenting with shortness of breath. The history is provided by the patient, a relative and medical records.  Shortness of Breath  Associated symptoms include shortness of breath.    Past Medical History  Diagnosis Date  . Coronary artery disease     Past Surgical History  Procedure Date  . Coronary artery bypass graft 12/10/2011    Procedure: CORONARY ARTERY BYPASS GRAFTING (CABG);  Surgeon: Delight Ovens, MD;  Location: [facility] OR;  Service: Open Heart Surgery;  Laterality: N/A;  Times four  using endoscopically harvested right greater saphenous and left internal mammary artery.    No family history on file.  History  Substance Use Topics  . Smoking status: Former Smoker -- 0.5 packs/day for 25 years    Types: Cigarettes    Quit date: 12/07/2003  . Smokeless tobacco: Not on file  . Alcohol Use: No      Review of Systems  Respiratory: Positive for shortness of breath.   All other systems reviewed and are negative.    Allergies  Review of patient's allergies indicates no known allergies.  Home  Medications   Current Outpatient Rx  Name Route Sig Dispense Refill  . ASPIRIN 325 MG PO TBEC Oral Take 325 mg by mouth daily.    [name] BISACODYL 5 MG PO TBEC Oral Take 10 mg by mouth daily as needed. For constipation    . HYDROCODONE-ACETAMINOPHEN 7.5-500 MG PO TABS Oral Take 1-2 tablets by mouth every 4 (four) hours as needed. For pain    . METOPROLOL TARTRATE 25 MG PO TABS Oral Take 25 mg by mouth 3 (three) times daily.      BP 120/77  Pulse 84  Temp(Src) 97.9 F (36.6 C) (Oral)  Resp 16  SpO2 99%  Physical Exam  Nursing note and vitals reviewed. Constitutional: He appears well-developed and well-nourished. No distress.  HENT:  Head: Normocephalic and atraumatic.  Mouth/Throat: Oropharynx is clear and moist. No oropharyngeal exudate.  Eyes: Conjunctivae and EOM are normal. Pupils are equal, round, and reactive to light. Right eye exhibits no discharge. Left eye exhibits no discharge. No scleral icterus.  Neck: Normal range of motion. Neck supple. No JVD present. No thyromegaly present.  Cardiovascular: Normal rate, regular rhythm, normal heart sounds and intact distal pulses.  Exam reveals no gallop and no friction rub.   No murmur heard. Pulmonary/Chest: Effort normal and breath sounds normal. No respiratory distress. He has no wheezes. He has no rales. He exhibits tenderness.       Sternal sternotomy scar, well-healed, no drainage, no erythema, minimal  tenderness  Abdominal: Soft. Bowel sounds are normal. He exhibits no distension and no mass. There is no tenderness.  Musculoskeletal: Normal range of motion. He exhibits edema ( Scant right lower extremity edema ). He exhibits no tenderness.  Lymphadenopathy:    He has no cervical adenopathy.  Neurological: He is alert. Coordination normal.  Skin: Skin is warm and dry. No rash noted. No erythema.  Psychiatric: He has a normal mood and affect. His behavior is normal.    ED Course  Procedures (including critical care time)  ED  ECG REPORT   Date: 12/29/2011   Rate: 86  Rhythm: normal sinus rhythm  QRS Axis: normal  Intervals: normal  ST/T Wave abnormalities: ST elevation persistent in the inferior leads and lateral precordial leads. T wave inversion in lead 1 and aVL. Q waves present inferior  Conduction Disutrbances:none  Narrative Interpretation:   Old EKG Reviewed: changes noted since EKG from 12/11/2011, ST elevation persistent in inferior leads, T wave inversions in lateral leads now new.   Labs Reviewed  PRO B NATRIURETIC PEPTIDE - Abnormal; Notable for the following:    Pro B Natriuretic peptide (BNP) 249.8 (*)    All other components within normal limits  CBC - Abnormal; Notable for the following:    Hemoglobin 12.9 (*)    HCT 37.8 (*)    All other components within normal limits  BASIC METABOLIC PANEL - Abnormal; Notable for the following:    GFR calc non Af Amer 76 (*)    GFR calc Af Amer 88 (*)    All other components within normal limits  POCT I-STAT, CHEM 8 - Abnormal; Notable for the following:    Calcium, Ion 1.37 (*)    All other components within normal limits  DIFFERENTIAL  POCT I-STAT TROPONIN I  APTT  PROTIME-INR  POCT I-STAT TROPONIN I   Dg Chest 2 View  12/29/2011  *RADIOLOGY REPORT*  Clinical Data: Shortness of breath.  CHEST - 2 VIEW  Comparison: 12/12/2011  Findings: Stable appearance of postoperative changes in the mediastinum.  Normal heart size and pulmonary vascularity. There is mild residual blunting of the left costophrenic angle with slight atelectasis, improving since previous study and likely related to recent postoperative change.  No blunting of the right costophrenic angle.  No pneumothorax. No focal consolidation in the lungs.  Mild degenerative changes in the spine.  IMPRESSION: Improving left pleural reaction, likely related to previous surgery.  No focal consolidation.  Original Report Authenticated By: Marlon Pel, M.D.     1. Shortness of breath   2.  Abnormal ECG       MDM  Acute onset shortness of breath the patient has recently undergone bypass surgery, concern for occlusion of grafts, effusions, less likely pulmonary embolism with oxygen saturations of 100% on room air without significant tachycardia.  Given the patient's recent bypass surgery my concerns that this could be an acute coronary syndrome equivalent. The patient does appear clinically stable at this time, cardiology paged.  Labs negative for elevated troponin  D/w cardiology - will come to evaluate and admit.     Vida Roller, MD 12/29/11 [phone]

## 2011-12-29 NOTE — Progress Notes (Signed)
  Echocardiogram 2D Echocardiogram has been performed.  Michael Spears A 12/29/2011, 4:38 PM

## 2011-12-29 NOTE — Discharge Summary (Signed)
Discharge Summary   Patient ID: Michael Spears MRN: [id number], DOB/AGE: 1955/09/13 56 y.o.  Primary MD: Leanor Rubenstein, MD Primary Cardiologist: Verne Carrow MD Admit date: 12/29/2011 D/C date:     12/30/2011      Primary Discharge Diagnoses:  1. Pericardial Effusion  - In the setting of post op CABG (12/1011)  - Echo revealed normal LV & RV size and systolic function, EF 60%, small to moderate pericardial effusion primarily posteriorly and anterolaterally. No evidence for tamponade.   2. Dyspnea  - In the setting of #1  - EKG with new TWI I,aVL, V2; Cardiac enzymes normal, no arrhythmias on tele  - CTA chest negative for PE  Secondary Discharge Diagnoses:  1. CAD s/p 4V CABG 12/10/11 (LIMA to LAD, sequential SVG to OM1 & OM2, SVG to PDA) 2. Hyperlipidemia 3. H/o Tobacco Abuse - 0.5 packs/day for 25 years, quit 2005 4. Fibromyalgia 5. Chronic neck pain 6. Headache 7. Acute prostatitis 8. Acute pyelonephritis   Allergies No Known Allergies  Diagnostic Studies/Procedures:   12/29/11 - 2D Echocardiogram Study Conclusions: - Left ventricle: The cavity size was normal. Wall thickness was normal. Septal bounce likely due to recent cardiac surgery. The estimated ejection fraction was 60%. Wall motion was normal; there were no regional wall motion abnormalities. Features are consistent with a pseudonormal left ventricular filling pattern, with concomitant abnormal relaxation and increased filling pressure (grade 2 diastolic dysfunction). - Aortic valve: There was no stenosis. - Mitral valve: No significant regurgitation. - Right ventricle: The cavity size was normal. Systolic function was normal. - Pulmonary arteries: No complete TR doppler jet so unable to estimate PA systolic pressure. - Inferior vena cava: The vessel was normal in size; the respirophasic diameter changes were in the normal range (= 50%); findings are consistent with normal central venous pressure. -  Pericardium, extracardiac: Small to moderate primarily posterior and anterolateral pericardial effusion with no evidence for tamponade.  Impressions: Normal LV size and systolic function, EF 60%. Septal bounce consistent with recent cardiac surgery. Normal RV size and systolic function. Small to moderate pericardial effusion primarily posteriorly and anterolaterally. No evidence for tamponade.  12/29/11 - CTA Chest Findings: Postoperative change compatible with median sternotomy and CABG procedure. No mediastinal or hilar adenopathy.No supraclavicular or axillary adenopathy noted. The pulmonary arteries are patent. There is no evidence for acute pulmonary embolus. There is a small left pleural effusion.  Plate-like atelectasis noted within the left base overlying the effusion. No airspace consolidation. No pulmonary nodule or mass. Review of the visualized osseous structures is significant for recent sternotomy defect. IMPRESSION: 1. Small left pleural effusion likely postoperative 2. No evidence for acute pulmonary embolus.   History of Present Illness: 56 y.o. male w/ PMHx significant for CAD s/p CABG 12/10/11 who presented to [facility] on 12/29/11 with complaints of shortness of breath.  He presented in early May 2013 with unstable angina, underwent cardiac catheterization revealing triple vessel disease, and had 4V CABG on 12/10/11. He was discharged in stable conditions with no post-op complications and stated he was doing well at home until the night of presentation when he developed acute onset dyspnea. He had no chest pain or no other constitutional symptoms. He had his family drive him to ED approx one hour later as dyspnea did not improve.   Hospital Course: His dyspnea was improving by the time he reached the ED and resolved with supplemental oxygen. Room air saturations were 100%. EKG revealed NSR 66bpm with  new TWI I, aVL, V2 compared to prior EKG. CXR was without acute cardiopulmonary  abnormalities. Initial cardiac enzymes were normal. BNP was very mildly elevated and he did not appear volume overloaded on exam.  WBC and H&H were normal. With his recent hospitalization and CABG there was concern for ACS or PE.so he was admitted for further evaluation and treatment.  Cardiac enzymes were cycled and remained negative. He had no chest pain and no arrhythmias were seen on telemetry. CTA chest was negative for PE or acute cardiopulmonary findings. Echocardiogram was performed and revealed normal LV & RV size and systolic function, EF 60%, with small to moderate pericardial effusion, no evidence of tamponade. It was felt his dyspnea was related to the pericardial effusion, but he had no hemodynamic compromise and did not require any intervention. He was able to ambulate on room air without complaints of dyspnea. He was seen and evaluated by Dr.McAlhany who felt he was stable for discharge home with plans for follow up as scheduled below.  Discharge Vitals: Blood pressure 133/82, pulse 91, temperature 98.4 F (36.9 C), temperature source Oral, resp. rate 19, height 5\' 10"  (1.778 m), weight 178 lb 8 oz (80.967 kg), SpO2 94.00%.  Labs: Component Value Date   WBC 7.2 12/29/2011   HGB 13.3 12/29/2011   HCT 39.0 12/29/2011   MCV 85.1 12/29/2011   PLT 396 12/29/2011    Lab 12/29/11 0112 12/29/11 0008  NA 139 --  K 4.7 --  CL 104 --  CO2 -- 24  BUN 22 --  CREATININE 1.10 --  CALCIUM -- 10.3  GLUCOSE 83 --   Basename 12/29/11 1206 12/29/11 0840 12/29/11 0515  CKTOTAL 60 60 58  CKMB 1.8 2.0 2.0  TROPONINI <0.30 <0.30 <0.30    12/29/2011 00:08   Pro B Natriuretic peptide (BNP)  249.8 (H)     Discharge Medications   Medication List  As of 12/30/2011  8:31 AM   TAKE these medications         aspirin 325 MG EC tablet   Take 325 mg by mouth daily.      bisacodyl 5 MG EC tablet   Commonly known as: DULCOLAX   Take 10 mg by mouth daily as needed. For constipation       HYDROcodone-acetaminophen 7.5-500 MG per tablet   Commonly known as: LORTAB   Take 1-2 tablets by mouth every 4 (four) hours as needed. For pain      metoprolol tartrate 25 MG tablet   Commonly known as: LOPRESSOR   Take 25 mg by mouth 3 (three) times daily.            Disposition   Discharge Orders    Future Appointments: Provider: Department: Dept Phone: Center:   01/13/2012 3:00 PM Delight Ovens, MD [facility] [phone] [facility]   01/17/2012 2:00 PM Beatrice Lecher, PA [facility] [phone] [facility]     Future Orders Please Complete By Expires   Diet - low sodium heart healthy      Increase activity slowly      Discharge instructions      Comments:   **PLEASE REMEMBER TO BRING ALL OF YOUR MEDICATIONS TO EACH OF YOUR FOLLOW-UP OFFICE VISITS.      Follow-up Information    Follow up with Tereso Newcomer, PA on 01/17/2012. (2:00)    Contact information:   [facility] [address] [place] [address] [phone]  Outstanding Labs/Studies:  None  Duration of Discharge Encounter: Greater than 30 minutes including physician and PA time.  Signed, Kingston Guiles PA-C 12/30/2011, 8:31 AM

## 2011-12-29 NOTE — H&P (Signed)
Cardiology History and Physical  Leanor Rubenstein, MD, MD  History of Present Illness (and review of medical records): Michael Spears is a 56 y.o. male who presents for evaluation of chest pain.  He presented in early May 2013 with unstable angina.  He underwent cardiac catheterization which revealed triple vessel disease.  He then had 4v CABG on 12/10/11.  He was discharged in stable conditions with no post-op complications.  He stated he was doing well at home until 10:30pm night of presentation where he developed dyspnea of acute onset.  He had no chest pain or no other constitutional symptoms.  He had his family drive him to ED approx one hour later as dyspnea did not improve.  He states that his symptoms have now improved while on oxygen.    Cardiac Cath: 12/2011 Left main: No obstructive disease.  Left Anterior Descending Artery: Large caliber vessel that courses to the apex. The proximal vessel has diffuse 40% stenosis. The mid vessel has 100% occlusion. The distal vessel fills slowly in an antegrade fashion and is seen to fill also from right to left collaterals. Moderate sized diagonal branch with 40% stenosis.  Circumflex Artery: Moderate sized vessel with 70% mid stenosis. The first OM branch is small to moderate sized with 99% mid stenosis. The second OM branch is moderate sized with proximal 70-80% stenosis. The distal portion of OM2 bifurcates and has diffuse disease.  Right Coronary Artery: Large, dominant vessel with diffuse 50% plaque throughout the proximal and mid segment. The mid vessel has a 100% stenosis. The distal vessel, PDA and PLA fills from right to right and left to right collaterals. The apical LAD is also seen to fill via collaterals from the RCA.  Left Ventricular Angiogram: LVEF 60-65%.   CORONARY ARTERY BYPASS GRAFTING (CABG) x 4 (left internal mammary artery to the LAD, sequential saphenous vein graft to first and second obtuse marginals, saphenous vein graft to the posterior  descending coronary) endoscopic vein harvest right leg- on 12/10/2011   Previous diagnostic testing for coronary artery disease includes: cardiac catheterization. Previous history of cardiac disease includes Angina CABG Chest Pain Coronary Artery Disease. Coronary artery disease risk factors include: advanced age (older than 50 for men, 30 for women), dyslipidemia, male gender and smoking/ tobacco exposure. Patient denies history of angina and CABG.  Review of Systems Further review of systems was otherwise negative other than stated in HPI.  Patient Active Problem List  Diagnoses Date Noted  . Dyspnea 12/29/2011  . CAD (coronary artery disease) 12/09/2011  . Hyperlipidemia 12/07/2011  . Unstable angina 12/06/2011  . PYELONEPHRITIS, ACUTE 01/29/2008  . ACUTE PROSTATITIS 01/29/2008  . NECK PAIN, CHRONIC 08/11/2007  . FIBROMYALGIA, SEVERE 04/26/2007  . HEADACHE 04/12/2007   Past Medical History  Diagnosis Date  . Coronary artery disease     Past Surgical History  Procedure Date  . Coronary artery bypass graft 12/10/2011    Procedure: CORONARY ARTERY BYPASS GRAFTING (CABG);  Surgeon: Delight Ovens, MD;  Location: [facility] OR;  Service: Open Heart Surgery;  Laterality: N/A;  Times four  using endoscopically harvested right greater saphenous and left internal mammary artery.     (Not in a hospital admission) No Known Allergies  History  Substance Use Topics  . Smoking status: Former Smoker -- 0.5 packs/day for 25 years    Types: Cigarettes    Quit date: 12/07/2003  . Smokeless tobacco: Not on file  . Alcohol Use: No    No family history  on file.   Objective: Patient Vitals for the past 8 hrs:  BP Temp Temp src Pulse Resp SpO2  12/29/11 0200 120/77 mmHg - - 84  16  99 %  12/29/11 0130 118/87 mmHg - - 84  17  98 %  12/29/11 0056 121/72 mmHg 97.9 F (36.6 C) Oral 85  19  100 %  12/29/11 0012 96/54 mmHg 98.4 F (36.9 C) Oral 84  19  98 %   General Appearance:    Alert,  cooperative, no distress, appears stated age  Head:    Normocephalic, without obvious abnormality, atraumatic  Eyes:     PERRL, EOMI, anicteric sclerae  Neck:   Supple, no carotid bruit or JVD  Lungs:     Clear to auscultation bilaterally, respirations unlabored  Heart:    Regular rate and rhythm, S1 and S2 normal, no murmur  Abdomen:     Soft, non-tender, normoactive bowel sounds  Extremities:   Extremities normal, atraumatic, no cyanosis or edema  Pulses:   2+ and symmetric all extremities  Skin:   Mid sternotomy wound healing well  Neurologic:   No focal deficits. AAO x3   Results for orders placed during the hospital encounter of 12/29/11 (from the past 48 hour(s))  PRO B NATRIURETIC PEPTIDE     Status: Abnormal   Collection Time   12/29/11 12:08 AM      Component Value Range Comment   Pro B Natriuretic peptide (BNP) 249.8 (*) 0 - 125 (pg/mL)   CBC     Status: Abnormal   Collection Time   12/29/11 12:08 AM      Component Value Range Comment   WBC 7.2  4.0 - 10.5 (K/uL)    RBC 4.44  4.22 - 5.81 (MIL/uL)    Hemoglobin 12.9 (*) 13.0 - 17.0 (g/dL)    HCT 95.2 (*) 84.1 - 52.0 (%)    MCV 85.1  78.0 - 100.0 (fL)    MCH 29.1  26.0 - 34.0 (pg)    MCHC 34.1  30.0 - 36.0 (g/dL)    RDW 32.4  40.1 - 02.7 (%)    Platelets 396  150 - 400 (K/uL)   DIFFERENTIAL     Status: Normal   Collection Time   12/29/11 12:08 AM      Component Value Range Comment   Neutrophils Relative 58  43 - 77 (%)    Neutro Abs 4.2  1.7 - 7.7 (K/uL)    Lymphocytes Relative 31  12 - 46 (%)    Lymphs Abs 2.2  0.7 - 4.0 (K/uL)    Monocytes Relative 8  3 - 12 (%)    Monocytes Absolute 0.6  0.1 - 1.0 (K/uL)    Eosinophils Relative 3  0 - 5 (%)    Eosinophils Absolute 0.2  0.0 - 0.7 (K/uL)    Basophils Relative 0  0 - 1 (%)    Basophils Absolute 0.0  0.0 - 0.1 (K/uL)   BASIC METABOLIC PANEL     Status: Abnormal   Collection Time   12/29/11 12:08 AM      Component Value Range Comment   Sodium 136  135 - 145 (mEq/L)     Potassium 4.4  3.5 - 5.1 (mEq/L)    Chloride 102  96 - 112 (mEq/L)    CO2 24  19 - 32 (mEq/L)    Glucose, Bld 87  70 - 99 (mg/dL)    BUN 20  6 - 23 (mg/dL)  Creatinine, Ser 1.07  0.50 - 1.35 (mg/dL)    Calcium 16.1  8.4 - 10.5 (mg/dL)    GFR calc non Af Amer 76 (*) >90 (mL/min)    GFR calc Af Amer 88 (*) >90 (mL/min)   POCT I-STAT TROPONIN I     Status: Normal   Collection Time   12/29/11 12:22 AM      Component Value Range Comment   Troponin i, poc 0.01  0.00 - 0.08 (ng/mL)    Comment 3            APTT     Status: Normal   Collection Time   12/29/11 12:59 AM      Component Value Range Comment   aPTT 36  24 - 37 (seconds)   PROTIME-INR     Status: Normal   Collection Time   12/29/11 12:59 AM      Component Value Range Comment   Prothrombin Time 13.1  11.6 - 15.2 (seconds)    INR 0.97  0.00 - 1.49    POCT I-STAT TROPONIN I     Status: Normal   Collection Time   12/29/11  1:10 AM      Component Value Range Comment   Troponin i, poc 0.02  0.00 - 0.08 (ng/mL)    Comment 3            POCT I-STAT, CHEM 8     Status: Abnormal   Collection Time   12/29/11  1:12 AM      Component Value Range Comment   Sodium 139  135 - 145 (mEq/L)    Potassium 4.7  3.5 - 5.1 (mEq/L)    Chloride 104  96 - 112 (mEq/L)    BUN 22  6 - 23 (mg/dL)    Creatinine, Ser 0.96  0.50 - 1.35 (mg/dL)    Glucose, Bld 83  70 - 99 (mg/dL)    Calcium, Ion 0.45 (*) 1.12 - 1.32 (mmol/L)    TCO2 27  0 - 100 (mmol/L)    Hemoglobin 13.3  13.0 - 17.0 (g/dL)    HCT 40.9  81.1 - 91.4 (%)    Dg Chest 2 View  12/29/2011  *RADIOLOGY REPORT*  Clinical Data: Shortness of breath.  CHEST - 2 VIEW  Comparison: 12/12/2011  Findings: Stable appearance of postoperative changes in the mediastinum.  Normal heart size and pulmonary vascularity. There is mild residual blunting of the left costophrenic angle with slight atelectasis, improving since previous study and likely related to recent postoperative change.  No blunting of the  right costophrenic angle.  No pneumothorax. No focal consolidation in the lungs.  Mild degenerative changes in the spine.  IMPRESSION: Improving left pleural reaction, likely related to previous surgery.  No focal consolidation.  Original Report Authenticated By: Marlon Pel, M.D.    ECG:  Sinus rhythm HR 86 ST elevations remain now with q waves, also st elevations lateral V4-V6, TWI 1, avl, post MI changes.   Assessment: Dyspnea, unclear etiology, does not appear to be volume overload at this time. Has risk factors for PE given recent hospitalization and surgery, however, symptoms not suggestive. CAD s/p recent CABG   Plan:  1. Admit to Cardiology, Telemetry Unit 2. Repeat ekg on admit, prn chest pain or arrythmia 3. Trend cardiac biomarkers 4. Continue home meds 5. If any return of dyspnea or respiratory distress, will consider CTPA.

## 2011-12-29 NOTE — Progress Notes (Signed)
Pt. Ambulated 500 ft without oxygen , sats ranging 93-96%. No complaints. Finnleigh Marchetti, Chrystine Oiler

## 2011-12-29 NOTE — Progress Notes (Signed)
Cardiology Progress Note Patient Name: Michael Spears Date of Encounter: 12/29/2011, 9:31 AM     Subjective  No overnight events. Patient denies chest pain or dyspnea. Has not been out of bed and is on 2.5L [place].   Objective   Telemetry: Sinus rhythm 80s  Medications: . aspirin  324 mg Oral Once  . aspirin  325 mg Oral Daily  . heparin  5,000 Units Subcutaneous Q8H  . metoprolol tartrate  25 mg Oral TID  . sodium chloride  3 mL Intravenous Q12H   . sodium chloride 50 mL/hr at 12/29/11 0543    Physical Exam: Temp:  [97.9 F (36.6 C)-98.7 F (37.1 C)] 98.7 F (37.1 C) (05/29 0458) Pulse Rate:  [80-85] 82  (05/29 0458) Resp:  [14-19] 18  (05/29 0458) BP: (96-129)/(54-87) 129/70 mmHg (05/29 0458) SpO2:  [97 %-100 %] 97 % (05/29 0458) Weight:  [178 lb 8 oz (80.967 kg)] 178 lb 8 oz (80.967 kg) (05/29 0458)  General: Pleasant middle-aged white male, in no acute distress. Head: Normocephalic, atraumatic, sclera non-icteric, nares are without discharge.  Neck: Supple. Negative for carotid bruits or JVD Lungs: Clear bilaterally to auscultation without wheezes, rales, or rhonchi. Breathing is unlabored. Heart: RRR S1 S2 without murmurs, rubs, or gallops. Midline sternal incision healing. Abdomen: Soft, non-tender, non-distended with normoactive bowel sounds. No rebound/guarding. No obvious abdominal masses. Msk:  Strength and tone appear normal for age. Extremities: Trace to 1+ RLE edema to shin. No clubbing or cyanosis. Distal pedal pulses are intact and equal bilaterally. Neuro: Alert and oriented X 3. Moves all extremities spontaneously. Psych:  Responds to questions appropriately with a normal affect.   Labs:  [facility] 12/29/11 0112 12/29/11 0008  NA 139 136  K 4.7 4.4  CL 104 102  CO2 -- 24  GLUCOSE 83 87  BUN 22 20  CREATININE 1.10 1.07  CALCIUM -- 10.3   Basename 12/29/11 0112 12/29/11 0008  WBC -- 7.2  NEUTROABS -- 4.2  HGB 13.3 12.9*  HCT 39.0 37.8*  MCV  -- 85.1  PLT -- 396    12/29/2011 05:15 12/29/2011 08:40  CK, MB 2.0 2.0  CK Total 58 60  Troponin I <0.30 <0.30    12/29/2011 00:08  Pro B Natriuretic peptide (BNP) 249.8 (H)   Radiology/Studies:   12/29/2011 - Chest 2 View Findings: Stable appearance of postoperative changes in the mediastinum.  Normal heart size and pulmonary vascularity. There is mild residual blunting of the left costophrenic angle with slight atelectasis, improving since previous study and likely related to recent postoperative change.  No blunting of the right costophrenic angle.  No pneumothorax. No focal consolidation in the lungs.  Mild degenerative changes in the spine.  IMPRESSION: Improving left pleural reaction, likely related to previous surgery.  No focal consolidation.      Assessment and Plan  56 y.o. male w/ PMHx significant for CAD s/p CABG 12/10/11 who presented to [facility] on 12/29/11 with complaints of shortness of breath.  1. Acute Respiratory Failure: Patient presented with acute onset dyspnea while sitting at home last evening. No complaints of chest pain, palpitations, or syncope. His dyspnea was improving by the time he reached the ED and resolved with supplemental oxygen (room air sats were 100%). He had recent hospitalization and surgery which puts him at risk of PE, although he was not tachycardic, hypoxic or hemodynamically unstable upon presentation. CXR showed improving left pleural reaction r/t previous surgery,  no acute findings. He did not appear volume overloaded. BNP very mildly elevated. H&H stable. EKG showed new TWI I, aVL, V2, however, do not suspect ACS as cardiac enzymes are normal x2 and he is chest pain free. No arrhythmias on telemetry. No tachycardia, although he is on Lopressor 25mg  TID. He is currently without dyspnea on 2.5L [place]. Cont to cycle enzymes. Wean O2 and ambulate to assess for further dyspnea. Discuss possible CTA with MD.   Signed, HOPE, JESSICA PA-C  I have  personally seen and examined this patient with [facility], PA-C. I agree with the assessment and plan as outlined above. Will d/c O2 and ambulate. Will order echo to exclude effusion and assess LV function post CABG. Will order CTA chest to exclude PE.   Michael Spears 10:44 AM 12/29/2011

## 2011-12-29 NOTE — ED Notes (Addendum)
C/o sob, onset 1.5 hrs ago, CABG on 5/2, pt of Drs Nydia Bouton & Rose Fillers, some R leg swelling since surgery, (denies: pain, dizziness, numbness or other sx), alert, NAD, calm, interactive, skin W&D, resps e/u, speaking in soft clear complete sentences, chest incision healing well, no s/sx of infection. Pedal pulses palpable. Weak cough present, has chest pillow with him. LS CTA clear and diminished.

## 2011-12-30 ENCOUNTER — Other Ambulatory Visit: Payer: Self-pay | Admitting: *Deleted

## 2011-12-30 DIAGNOSIS — I2510 Atherosclerotic heart disease of native coronary artery without angina pectoris: Secondary | ICD-10-CM

## 2011-12-30 DIAGNOSIS — I313 Pericardial effusion (noninflammatory): Secondary | ICD-10-CM | POA: Insufficient documentation

## 2011-12-30 DIAGNOSIS — R0602 Shortness of breath: Secondary | ICD-10-CM

## 2011-12-30 DIAGNOSIS — G8918 Other acute postprocedural pain: Secondary | ICD-10-CM

## 2011-12-30 MED ORDER — HYDROCODONE-ACETAMINOPHEN 7.5-500 MG PO TABS: 1.0000 | ORAL_TABLET | ORAL | Status: DC | PRN

## 2011-12-30 NOTE — Discharge Summary (Signed)
Full note this am. cdm 

## 2011-12-30 NOTE — Progress Notes (Signed)
SUBJECTIVE:No recurrence of dyspnea. No chest pain. No events.   BP 133/82  Pulse 91  Temp(Src) 98.4 F (36.9 C) (Oral)  Resp 19  Ht 5\' 10"  (1.778 m)  Wt 178 lb 8 oz (80.967 kg)  BMI 25.61 kg/m2  SpO2 94%  Intake/Output Summary (Last 24 hours) at 12/30/11 0657 Last data filed at 12/29/11 2009  Gross per 24 hour  Intake    600 ml  Output      0 ml  Net    600 ml    PHYSICAL EXAM General: Well developed, well nourished, in no acute distress. Alert and oriented x 3.  Psych:  Good affect, responds appropriately Neck: No JVD. No masses noted.  Lungs: Clear bilaterally with no wheezes or rhonci noted.  Heart: RRR with no murmurs noted. Abdomen: Bowel sounds are present. Soft, non-tender.  Extremities: No lower extremity edema.   LABS: Basic Metabolic Panel:  Basename 12/29/11 0112 12/29/11 0008  NA 139 136  K 4.7 4.4  CL 104 102  CO2 -- 24  GLUCOSE 83 87  BUN 22 20  CREATININE 1.10 1.07  CALCIUM -- 10.3  MG -- --  PHOS -- --   CBC:  Basename 12/29/11 0112 12/29/11 0008  WBC -- 7.2  NEUTROABS -- 4.2  HGB 13.3 12.9*  HCT 39.0 37.8*  MCV -- 85.1  PLT -- 396   Cardiac Enzymes:  Basename 12/29/11 1206 12/29/11 0840 12/29/11 0515  CKTOTAL 60 60 58  CKMB 1.8 2.0 2.0  CKMBINDEX -- -- --  TROPONINI <0.30 <0.30 <0.30    Current Meds:    . aspirin  325 mg Oral Daily  . heparin  5,000 Units Subcutaneous Q8H  . metoprolol tartrate  25 mg Oral TID  . sodium chloride  3 mL Intravenous Q12H   Echo: 12/29/11: Left ventricle: The cavity size was normal. Wall thickness was normal. Septal bounce likely due to recent cardiac surgery. The estimated ejection fraction was 60%. Wall motion was normal; there were no regional wall motion abnormalities. Features are consistent with a pseudonormal left ventricular filling pattern, with concomitant abnormal relaxation and increased filling pressure (grade 2 diastolic dysfunction). - Aortic valve: There was no  stenosis. - Mitral valve: No significant regurgitation. - Right ventricle: The cavity size was normal. Systolic function was normal. - Pulmonary arteries: No complete TR doppler jet so unable to estimate PA systolic pressure. - Inferior vena cava: The vessel was normal in size; the respirophasic diameter changes were in the normal range (= 50%); findings are consistent with normal central venous pressure. - Pericardium, extracardiac: Small to moderate primarily posterior and anterolateral pericardial effusion with no evidence for tamponade. Impressions:  - Normal LV size and systolic function, EF 60%. Septal bounce consistent with recent cardiac surgery. Normal RV size and systolic function. Small to moderate pericardial effusion primarily posteriorly and anterolaterally. No evidence for tamponade.  CT chest: 12/29/11: IMPRESSION:  1. Small left pleural effusion likely postoperative  2. No evidence for acute pulmonary embolus.   ASSESSMENT AND PLAN: 56 y.o. male w/ PMHx significant for CAD s/p CABG 12/10/11 who presented to [facility] on 12/29/11 with complaints of shortness of breath.   1. Dyspnea: Patient presented with acute onset dyspnea while sitting at home. Cardiac enzymes negative. No chest pain. No signs of ischemia. He had recent hospitalization and surgery which puts him at risk of PE so we ordered a CTA chest which was negative for PE. CXR showed improving  left pleural reaction r/t previous surgery, no acute findings. He is not volume overloaded.  H&H stable.  No arrhythmias on telemetry. Echo yesterday with small pericardial effusion but no hemodynamic compromise. He overall feels better and has been on room air overnight. Continue current meds.   2. Dispo: D/C home today. We can move his f/u with Tereso Newcomer to 3 weeks from now.     Rafferty Postlewait  5/30/20136:57 AM

## 2011-12-31 ENCOUNTER — Encounter: Payer: Managed Care, Other (non HMO) | Admitting: Physician Assistant

## 2012-01-05 ENCOUNTER — Telehealth: Payer: Self-pay | Admitting: Cardiovascular Disease

## 2012-01-05 NOTE — Telephone Encounter (Signed)
Pt has developed a cough and wants to know is that normal to get that after bypass surgery he called he Gerhardt's office and was told to call our office

## 2012-01-05 NOTE — Telephone Encounter (Signed)
Patient called because he said since he was D/C from the hospital the last time, he started having a dry cough that comes and goes, it is very annoying. Patient had open heart surgery on 12/10/11. Patient would like to know if that is normal after heart surgery. Patient states before d/c MD said that he had some fluid in his right lung and around the heart, but nothing to be concern. Pt just wants to make sure about it. Patient has a post-op visit on 01/13/12 with Tereso Newcomer PA.

## 2012-01-06 NOTE — Telephone Encounter (Signed)
A dry cough is usually not related to CABG or  A normal part of the post-operative course. He is not on any meds that would cause a  Dry cough. Can we have him call his primary care doctor for evaluation if it persists? Thanks, chris

## 2012-01-06 NOTE — Telephone Encounter (Signed)
Follow-up:    Patient has called back about his cough and would like to know if he would be able to take any medication for it.  Please call back.

## 2012-01-06 NOTE — Telephone Encounter (Signed)
Patient called back again today, because of the dry cough he has since D/C from the hospital . He would like to know if it is normal after open heart surgery, and if he can take something for it.

## 2012-01-06 NOTE — Telephone Encounter (Signed)
Patient is aware of MD's assessment , and  recommendations. Pt is aware also to call his PCP for evaluations if dry cough persist. Patient verbalized understanding.

## 2012-01-10 ENCOUNTER — Encounter: Payer: Self-pay | Admitting: Physician Assistant

## 2012-01-10 ENCOUNTER — Other Ambulatory Visit: Payer: Self-pay | Admitting: Cardiothoracic Surgery

## 2012-01-12 ENCOUNTER — Telehealth: Payer: Self-pay | Admitting: Physician Assistant

## 2012-01-12 NOTE — Telephone Encounter (Signed)
Patient called answering service re: mild intermittent SSCP described as "throbbing" occurring this evening after showering and while preparing dinner lasting transiently for several seconds and resolving spontaneously. No radiation. No association with exertion, shortness of breath, lightheadedness, diaphoresis or nausea. He states this is different from his prior MI which he described as more of a punch. Upon review, he has been having a nagging cough for the past several days. No aggravating/alleviating factors. He states it has gotten much better as time goes on and has nearly resolved. Advised this may be pleuritic pain due to his cough, and to continue to monitor. The patient has an appointment with Dr. Tyrone Sage tomorrow. I advised to bring this up. Additionally, he does not have NTG SL PRN at home. This will need to be addressed either on follow-up with Dr. Tyrone Sage or Tereso Newcomer on 01/17/12 for future episodes of chest pain. If this worsens, informed to present to the ED for formal evaluation. He understood and agreed.    Jacqulyn Bath, PA-C 01/12/2012 7:53 PM

## 2012-01-13 ENCOUNTER — Inpatient Hospital Stay (HOSPITAL_COMMUNITY)
Admission: EM | Admit: 2012-01-13 | Discharge: 2012-01-15 | DRG: 247 | Disposition: A | Discharge status: Home / Self Care | Payer: PRIVATE HEALTH INSURANCE | Source: Ambulatory Visit | Attending: Internal Medicine | Admitting: Internal Medicine

## 2012-01-13 ENCOUNTER — Encounter (HOSPITAL_COMMUNITY): Payer: Self-pay | Admitting: *Deleted

## 2012-01-13 ENCOUNTER — Other Ambulatory Visit: Payer: Self-pay | Admitting: Cardiothoracic Surgery

## 2012-01-13 ENCOUNTER — Ambulatory Visit
Admission: RE | Admit: 2012-01-13 | Discharge: 2012-01-13 | Disposition: A | Discharge status: Home / Self Care | Payer: PRIVATE HEALTH INSURANCE | Source: Ambulatory Visit | Attending: Cardiothoracic Surgery | Admitting: Cardiothoracic Surgery

## 2012-01-13 ENCOUNTER — Ambulatory Visit (INDEPENDENT_AMBULATORY_CARE_PROVIDER_SITE_OTHER): Payer: Self-pay | Admitting: Cardiothoracic Surgery

## 2012-01-13 ENCOUNTER — Emergency Department (HOSPITAL_COMMUNITY): Payer: PRIVATE HEALTH INSURANCE

## 2012-01-13 ENCOUNTER — Ambulatory Visit: Payer: Managed Care, Other (non HMO) | Admitting: Cardiothoracic Surgery

## 2012-01-13 ENCOUNTER — Encounter: Payer: Self-pay | Admitting: Cardiothoracic Surgery

## 2012-01-13 VITALS — BP 121/81 | HR 88 | Temp 97.3°F | Resp 20 | Ht 70.0 in | Wt 179.0 lb

## 2012-01-13 DIAGNOSIS — G8929 Other chronic pain: Secondary | ICD-10-CM | POA: Diagnosis present

## 2012-01-13 DIAGNOSIS — I2510 Atherosclerotic heart disease of native coronary artery without angina pectoris: Secondary | ICD-10-CM

## 2012-01-13 DIAGNOSIS — Z87891 Personal history of nicotine dependence: Secondary | ICD-10-CM

## 2012-01-13 DIAGNOSIS — I313 Pericardial effusion (noninflammatory): Secondary | ICD-10-CM | POA: Diagnosis present

## 2012-01-13 DIAGNOSIS — E785 Hyperlipidemia, unspecified: Secondary | ICD-10-CM | POA: Diagnosis present

## 2012-01-13 DIAGNOSIS — I2584 Coronary atherosclerosis due to calcified coronary lesion: Secondary | ICD-10-CM | POA: Diagnosis present

## 2012-01-13 DIAGNOSIS — I25810 Atherosclerosis of coronary artery bypass graft(s) without angina pectoris: Secondary | ICD-10-CM | POA: Diagnosis present

## 2012-01-13 DIAGNOSIS — I214 Non-ST elevation (NSTEMI) myocardial infarction: Principal | ICD-10-CM | POA: Diagnosis present

## 2012-01-13 DIAGNOSIS — IMO0001 Reserved for inherently not codable concepts without codable children: Secondary | ICD-10-CM | POA: Diagnosis present

## 2012-01-13 DIAGNOSIS — I252 Old myocardial infarction: Secondary | ICD-10-CM

## 2012-01-13 DIAGNOSIS — Z951 Presence of aortocoronary bypass graft: Secondary | ICD-10-CM

## 2012-01-13 DIAGNOSIS — N10 Acute tubulo-interstitial nephritis: Secondary | ICD-10-CM | POA: Diagnosis present

## 2012-01-13 DIAGNOSIS — I319 Disease of pericardium, unspecified: Secondary | ICD-10-CM | POA: Diagnosis present

## 2012-01-13 DIAGNOSIS — R059 Cough, unspecified: Secondary | ICD-10-CM

## 2012-01-13 DIAGNOSIS — R51 Headache: Secondary | ICD-10-CM | POA: Diagnosis present

## 2012-01-13 DIAGNOSIS — R05 Cough: Secondary | ICD-10-CM

## 2012-01-13 DIAGNOSIS — M542 Cervicalgia: Secondary | ICD-10-CM | POA: Diagnosis present

## 2012-01-13 DIAGNOSIS — R079 Chest pain, unspecified: Secondary | ICD-10-CM

## 2012-01-13 LAB — CBC
HCT: 40.8 % (ref 39.0–52.0)
HCT: 38.9 % — ABNORMAL LOW (ref 39.0–52.0)
Hemoglobin: 12.9 g/dL — ABNORMAL LOW (ref 13.0–17.0)
MCH: 28 pg (ref 26.0–34.0)
MCHC: 34.1 g/dL (ref 30.0–36.0)
MCHC: 33.2 g/dL (ref 30.0–36.0)
MCV: 84.1 fL (ref 78.0–100.0)
MCV: 84.4 fL (ref 78.0–100.0)
Platelets: 213 10*3/uL (ref 150–400)
RDW: 12.6 % (ref 11.5–15.5)

## 2012-01-13 LAB — PROTIME-INR
INR: 1.06 (ref 0.00–1.49)
Prothrombin Time: 14 seconds (ref 11.6–15.2)

## 2012-01-13 LAB — POCT I-STAT TROPONIN I: Troponin i, poc: 0.1 ng/mL (ref 0.00–0.08)

## 2012-01-13 LAB — COMPREHENSIVE METABOLIC PANEL
Albumin: 4.3 g/dL (ref 3.5–5.2)
BUN: 18 mg/dL (ref 6–23)
Calcium: 10.4 mg/dL (ref 8.4–10.5)
Creatinine, Ser: 1.19 mg/dL (ref 0.50–1.35)
Total Protein: 6.9 g/dL (ref 6.0–8.3)

## 2012-01-13 LAB — APTT: aPTT: 33 seconds (ref 24–37)

## 2012-01-13 MED ORDER — ONDANSETRON HCL 4 MG/2ML IJ SOLN: 4.0000 mg | Freq: Four times a day (QID) | INTRAMUSCULAR | Status: DC | PRN

## 2012-01-13 MED ORDER — ASPIRIN 81 MG PO CHEW: 324.0000 mg | CHEWABLE_TABLET | Freq: Once | ORAL | Status: DC

## 2012-01-13 MED ORDER — ENOXAPARIN SODIUM 40 MG/0.4ML ~~LOC~~ SOLN: 40.0000 mg | Freq: Every day | SUBCUTANEOUS | Status: DC

## 2012-01-13 MED ORDER — ASPIRIN EC 325 MG PO TBEC: 325.0000 mg | DELAYED_RELEASE_TABLET | Freq: Every day | ORAL | Status: DC

## 2012-01-13 MED ORDER — NITROGLYCERIN 2 % TD OINT
1.0000 [in_us] | TOPICAL_OINTMENT | Freq: Four times a day (QID) | TRANSDERMAL | Status: DC
  Administered 2012-01-13: 1 [in_us] via TOPICAL
  Filled 2012-01-13: qty 1

## 2012-01-13 MED ORDER — ATORVASTATIN CALCIUM 40 MG PO TABS: 40.0000 mg | ORAL_TABLET | Freq: Every day | ORAL | Status: DC

## 2012-01-13 MED ORDER — ASPIRIN EC 81 MG PO TBEC: 81.0000 mg | DELAYED_RELEASE_TABLET | Freq: Every day | ORAL | Status: DC

## 2012-01-13 MED ORDER — ACETAMINOPHEN 325 MG PO TABS: 650.0000 mg | ORAL_TABLET | ORAL | Status: DC | PRN

## 2012-01-13 MED ORDER — NITROGLYCERIN 0.4 MG SL SUBL: 0.4000 mg | SUBLINGUAL_TABLET | SUBLINGUAL | Status: DC | PRN

## 2012-01-13 MED ORDER — NITROGLYCERIN 0.4 MG SL SUBL
0.4000 mg | SUBLINGUAL_TABLET | SUBLINGUAL | Status: DC | PRN
  Administered 2012-01-13: 0.4 mg via SUBLINGUAL
  Filled 2012-01-13: qty 25

## 2012-01-13 MED ORDER — ATORVASTATIN CALCIUM 40 MG PO TABS
40.0000 mg | ORAL_TABLET | Freq: Every day | ORAL | Status: DC
  Administered 2012-01-14: 40 mg via ORAL
  Filled 2012-01-13 (×3): qty 1

## 2012-01-13 MED ORDER — BISACODYL 5 MG PO TBEC
5.0000 mg | DELAYED_RELEASE_TABLET | Freq: Every day | ORAL | Status: DC
  Administered 2012-01-14: 10 mg via ORAL
  Filled 2012-01-13 (×2): qty 2

## 2012-01-13 MED ORDER — SODIUM CHLORIDE 0.9 % IV SOLN
1000.0000 mL | INTRAVENOUS | Status: DC
Start: 2012-01-13 — End: 2012-01-13
  Administered 2012-01-13: 1000 mL via INTRAVENOUS

## 2012-01-13 MED ORDER — METOPROLOL TARTRATE 25 MG PO TABS
25.0000 mg | ORAL_TABLET | Freq: Three times a day (TID) | ORAL | Status: DC
  Administered 2012-01-14 – 2012-01-15 (×3): 25 mg via ORAL
  Filled 2012-01-13 (×9): qty 1

## 2012-01-13 MED ORDER — ASPIRIN 300 MG RE SUPP: 300.0000 mg | RECTAL | Status: DC

## 2012-01-13 MED ORDER — SODIUM CHLORIDE 0.9 % IV BOLUS (SEPSIS)
500.0000 mL | Freq: Once | INTRAVENOUS | Status: AC
  Administered 2012-01-13: 500 mL via INTRAVENOUS

## 2012-01-13 MED ORDER — ASPIRIN 81 MG PO CHEW: 324.0000 mg | CHEWABLE_TABLET | ORAL | Status: DC

## 2012-01-13 NOTE — ED Notes (Signed)
To ED for eval of chest pain. Pt had 4 vessel bypass on 5/11. States he felt this same pain prior to cabg. Pt is pale and dusky looking. Denies nausea. Denies vomiting.

## 2012-01-13 NOTE — Progress Notes (Signed)
[address]            [place] [address]          [id number]       Francena Hanly [facility] Medical Record #[id number] Date of Birth: 12/30/1955  Richrd Humbles, MD  Chief Complaint:   PostOp Follow Up Visit 12/06/2011     PROCEDURE: Coronary artery bypass grafting x4 with left internal  mammary to the left anterior descending coronary artery, sequential  reverse saphenous vein graft to the first and second obtuse marginal,  reverse saphenous vein graft to the posterior descending coronary artery  with right leg, thigh, and calf endovein harvesting.    History of Present Illness:      Patient returns to the office in followup after recent coronary artery bypass grafting x4. He's had some nonproductive cough. He noted that he was readmitted to the hospital after discharge by cardiology which I was unaware of. Stayed for 2 days and was discharged home. Since then he's noted no recurrent shortness of breath or chest discomfort.   History  Smoking status  . Former Smoker -- 0.5 packs/day for 25 years  . Types: Cigarettes  . Quit date: 12/07/2003  Smokeless tobacco  . Not on file       No Known Allergies  Current Outpatient Prescriptions  Medication Sig Dispense Refill  . aspirin 325 MG EC tablet Take 325 mg by mouth daily.      [name] atorvastatin (LIPITOR) 40 MG tablet Take 40 mg by mouth daily.      [name] azithromycin (ZITHROMAX) 250 MG tablet Take 250 mg by mouth daily.       . bisacodyl (DULCOLAX) 5 MG EC tablet Take 10 mg by mouth daily as needed. For constipation      . HYDROcodone-acetaminophen (LORTAB) 7.5-500 MG per tablet Take 1-2 tablets by mouth every 4 (four) hours as needed (may take one or two tabs every 4-6 hrs for pain..not to exceed 8 tabs per day). For pain  40 tablet  0  . HYDROcodone-homatropine (HYCODAN) 5-1.5 MG/5ML syrup Take 5 mLs by mouth every 4 (four) hours as needed.       . metoprolol  tartrate (LOPRESSOR) 25 MG tablet Take 25 mg by mouth 3 (three) times daily.      [name] DISCONTD: atorvastatin (LIPITOR) 40 MG tablet Take 1 tablet (40 mg total) by mouth daily.  30 tablet  0       Physical Exam: BP 121/81  Pulse 88  Temp 97.3 F (36.3 C) (Oral)  Resp 20  Ht 5\' 10"  (1.778 m)  Wt 179 lb (81.194 kg)  BMI 25.68 kg/m2  SpO2 96%  General appearance: alert and cooperative Neurologic: intact Heart: regular rate and rhythm, S1, S2 normal, no murmur, click, rub or gallop Lungs: clear to auscultation bilaterally and normal percussion bilaterally Abdomen: soft, non-tender; bowel sounds normal; no masses,  no organomegaly Extremities: extremities normal, atraumatic, no cyanosis or edema and Homans sign is negative, no sign of DVT Wound: Sternum is stable and intact wound is well-healed  Diagnostic Studies & Laboratory data:         Recent Radiology Findings: Dg Chest 2 View  01/13/2012  *RADIOLOGY REPORT*  Clinical Data: History of CABG.  Cough.  CHEST - 2 VIEW  Comparison: CT and plain films of the chest 12/29/2011.  Findings: The lungs are clear.  Heart size is upper normal. Small left pleural effusion seen on the comparison plain films has resolved.  No pneumothorax or pleural effusion.  The patient status post CABG.  IMPRESSION: No acute disease.  Original Report Authenticated By: Bernadene Bell. Maricela Curet, M.D.      Recent Labs: Lab Results  Component Value Date   WBC 7.2 12/29/2011   HGB 13.3 12/29/2011   HCT 39.0 12/29/2011   PLT 396 12/29/2011   GLUCOSE 83 12/29/2011   CHOL 206* 12/07/2011   TRIG 150* 12/07/2011   HDL 35* 12/07/2011   LDLCALC 141* 12/07/2011   NA 139 12/29/2011   K 4.7 12/29/2011   CL 104 12/29/2011   CREATININE 1.10 12/29/2011   BUN 22 12/29/2011   CO2 24 12/29/2011   TSH 3.689 12/06/2011   INR 0.97 12/29/2011   HGBA1C 5.9* 12/06/2011      Assessment / Plan:     Patient is doing well following coronary artery bypass grafting, He does have evidence of  hypercholesterolemia and with his readmission and discharge from cardiology and it up without a statin which I have prescribed Lipitor 40 mg once a day pending his followup visit with cardiology. We discussed returning to half day of work July 8 for one week and no lifting over 25 pounds for 3 months I plan to see him back when necessary at his request or cardiology request       Delight Ovens MD 01/13/2012 4:34 PM

## 2012-01-13 NOTE — H&P (Addendum)
Cardiology History and Physical  Cardiologist: Mcalhany  Reason for admit: CP  History of Present Illness (and review of medical records): Michael Spears is a 56 y.o. male with fibromyalgia, HL and CAD who presents for evaluation of chest pain.  He presented in early May 2013 with unstable angina.  He underwent cardiac catheterization which revealed triple vessel disease.  He then had 4v CABG on 12/10/11 with Dr. Tyrone Sage.  He was discharged in stable conditions with no post-op complications.    He was admitted several weeks ago with dyspnea. CE negative. CT negative for PE. ECHO with normal function. Small to moderate effusion. Has done reasonable well since that time except for persistent non-productive cough and some chest soreness. Has not gone to cardiac rehab but now up to walking 8 blocks without CP.   Saw Dr. Tyrone Sage today and was doing well. This afternoon was sitting at home and developed burning in his chest which then turned into a tightness. No associated nausea, dyspnea, diaphoresis. CP persisted so came to ER. Received some NTG but CP still persisted. Pain finally resolved after 4 hours. ECG without change. CXR normal. POC troponin 0.10   Cardiac Cath: 12/2011 Left main: No obstructive disease.  Left Anterior Descending Artery: Large caliber vessel that courses to the apex. The proximal vessel has diffuse 40% stenosis. The mid vessel has 100% occlusion. The distal vessel fills slowly in an antegrade fashion and is seen to fill also from right to left collaterals. Moderate sized diagonal branch with 40% stenosis.  Circumflex Artery: Moderate sized vessel with 70% mid stenosis. The first OM branch is small to moderate sized with 99% mid stenosis. The second OM branch is moderate sized with proximal 70-80% stenosis. The distal portion of OM2 bifurcates and has diffuse disease.  Right Coronary Artery: Large, dominant vessel with diffuse 50% plaque throughout the proximal and mid segment.  The mid vessel has a 100% stenosis. The distal vessel, PDA and PLA fills from right to right and left to right collaterals. The apical LAD is also seen to fill via collaterals from the RCA.  Left Ventricular Angiogram: LVEF 60-65%.    Review of Systems Denies fever, chills, nausea, vomiting, headache, edema, orthopnea, PND, palpitations, syncope, ab pain, focal neuro deficits, weight loss, rash, etc. +fibromyalgia.    Patient Active Problem List   Diagnosis Date Noted  . Chest pain 01/13/2012  . Pericardial effusion   . Dyspnea 12/29/2011  . CAD (coronary artery disease) 12/09/2011  . Hyperlipidemia 12/07/2011  . Unstable angina 12/06/2011  . PYELONEPHRITIS, ACUTE 01/29/2008  . ACUTE PROSTATITIS 01/29/2008  . NECK PAIN, CHRONIC 08/11/2007  . FIBROMYALGIA, SEVERE 04/26/2007  . HEADACHE 04/12/2007   Past Medical History  Diagnosis Date  . Coronary artery disease     s/p 4V CABG 12/10/11 (LIMA to LAD, sequential SVG to OM1 & OM2, SVG to PDA)  . HLD (hyperlipidemia)   . History of tobacco abuse     0.5 packs/day for 25 years, quit 2005  . Fibromyalgia   . Chronic neck pain   . Headache   . Acute prostatitis   . Acute pyelonephritis   . Pericardial effusion     echo 12/29/11 small to moderate pericardial effusion primarily posteriorly and anterolaterally    Past Surgical History  Procedure Date  . Coronary artery bypass graft 12/10/2011    Procedure: CORONARY ARTERY BYPASS GRAFTING (CABG);  Surgeon: Delight Ovens, MD;  Location: [facility] OR;  Service: Open Heart Surgery;  Laterality: N/A;  Times four  using endoscopically harvested right greater saphenous and left internal mammary artery.     (Not in a hospital admission) No Known Allergies  History  Substance Use Topics  . Smoking status: Former Smoker -- 0.5 packs/day for 25 years    Types: Cigarettes    Quit date: 12/07/2003  . Smokeless tobacco: Not on file  . Alcohol Use: No    History reviewed. No pertinent family  history.   Objective: Patient Vitals for the past 8 hrs:  BP Temp Temp src Pulse Resp SpO2  01/13/12 2100 77/42 mmHg - - 71  11  95 %  01/13/12 2045 95/60 mmHg - - 83  18  95 %  01/13/12 2031 121/79 mmHg - - - - -  01/13/12 2030 121/79 mmHg - - 81  22  96 %  01/13/12 1801 137/94 mmHg 97.9 F (36.6 C) Oral 89  22  98 %   General Appearance:    Alert, cooperative, no distress, appears stated age  Head:    Normocephalic, without obvious abnormality, atraumatic  Eyes:     PERRL, EOMI, anicteric sclerae  Neck:   Supple, no carotid bruit or JVD  Lungs:     Clear to auscultation bilaterally, respirations unlabored  Heart:    Regular rate and rhythm, S1 and S2 normal, no murmur. Sternotomy scar well healed. Mild CW tenderness. No instability  Abdomen:     Soft, non-tender, normoactive bowel sounds  Extremities:   Extremities normal, atraumatic, no cyanosis or edema  Pulses:   2+ and symmetric all extremities  Skin:   Mid sternotomy wound healing well  Neurologic:   No focal deficits. AAO x3   Results for orders placed during the hospital encounter of 01/13/12 (from the past 48 hour(s))  CBC     Status: Normal   Collection Time   01/13/12  4:10 PM      Component Value Range Comment   WBC 7.9  4.0 - 10.5 K/uL    RBC 4.85  4.22 - 5.81 MIL/uL    Hemoglobin 13.9  13.0 - 17.0 g/dL    HCT 40.9  81.1 - 91.4 %    MCV 84.1  78.0 - 100.0 fL    MCH 28.7  26.0 - 34.0 pg    MCHC 34.1  30.0 - 36.0 g/dL    RDW 78.2  95.6 - 21.3 %    Platelets 213  150 - 400 K/uL   COMPREHENSIVE METABOLIC PANEL     Status: Abnormal   Collection Time   01/13/12  4:10 PM      Component Value Range Comment   Sodium 139  135 - 145 mEq/L    Potassium 4.2  3.5 - 5.1 mEq/L    Chloride 103  96 - 112 mEq/L    CO2 27  19 - 32 mEq/L    Glucose, Bld 97  70 - 99 mg/dL    BUN 18  6 - 23 mg/dL    Creatinine, Ser 0.86  0.50 - 1.35 mg/dL    Calcium 57.8  8.4 - 10.5 mg/dL    Total Protein 6.9  6.0 - 8.3 g/dL    Albumin 4.3   3.5 - 5.2 g/dL    AST 18  0 - 37 U/L    ALT 18  0 - 53 U/L    Alkaline Phosphatase 88  39 - 117 U/L    Total Bilirubin 0.3  0.3 - 1.2 mg/dL  GFR calc non Af Amer 67 (*) >90 mL/min    GFR calc Af Amer 77 (*) >90 mL/min   PROTIME-INR     Status: Normal   Collection Time   01/13/12  4:10 PM      Component Value Range Comment   Prothrombin Time 14.0  11.6 - 15.2 seconds    INR 1.06  0.00 - 1.49   APTT     Status: Normal   Collection Time   01/13/12  4:10 PM      Component Value Range Comment   aPTT 33  24 - 37 seconds   POCT I-STAT TROPONIN I     Status: Abnormal   Collection Time   01/13/12  7:26 PM      Component Value Range Comment   Troponin i, poc 0.10 (*) 0.00 - 0.08 ng/mL    Comment NOTIFIED PHYSICIAN      Comment 3             Dg Chest 2 View  01/13/2012  *RADIOLOGY REPORT*  Clinical Data: History of CABG.  Cough.  CHEST - 2 VIEW  Comparison: CT and plain films of the chest 12/29/2011.  Findings: The lungs are clear.  Heart size is upper normal. Small left pleural effusion seen on the comparison plain films has resolved.  No pneumothorax or pleural effusion.  The patient status post CABG.  IMPRESSION: No acute disease.  Original Report Authenticated By: Bernadene Bell. D'ALESSIO, M.D.   Dg Chest Portable 1 View  01/13/2012  *RADIOLOGY REPORT*  Clinical Data: Chest pain  PORTABLE CHEST - 1 VIEW  Comparison: 01/13/2012  Findings: Cardiomediastinal silhouette is stable.  Status post median sternotomy again noted.  No acute infiltrate or pleural effusion.  No pulmonary edema.  Bony thorax is stable.  IMPRESSION: No active disease.  No significant change.  Original Report Authenticated By: Natasha Mead, M.D.    ECG:  Sinus rhythm HR 89 old inferior Qs. No ST-T wave abnormalities.    Assessment: 1. CP, atypical 2. CAD s/p recent CABG 3. Small to moderate pericardial effusion on recent echo 4. Fibromyalgia 5. Cough  Plan:  CP atypical - suspect non-cardiac. Troponin minimally  elevated. Will bring in for 23-hour obs. Cycle markers and check limited echo.  Suspect we will be able to discharge in am.  Truman Hayward 10:01 PM   Troponin 0.79. Patient CP free. Will need cath. IV heparin started. Made npo. Cath orders placed. Ticagrelor started.  Tamyra Fojtik,MD 12:52 AM

## 2012-01-13 NOTE — ED Provider Notes (Signed)
History     CSN: [id number]  Arrival date & time 01/13/12  1756   First MD Initiated Contact with Patient 01/13/12 1821      Chief Complaint  Patient presents with  . Chest Pain   Patient is a 56 y.o. male presenting with chest pain. The history is provided by the patient.  Chest Pain The chest pain began 1 - 2 hours ago. Duration of episode(s) is 90 minutes. Chest pain occurs constantly. The chest pain is improving. Associated with: pt was walking down the hall. At its most intense, the pain is at 8/10. The pain is currently at 5/10. The quality of the pain is described as aching. The pain radiates to the right arm. Primary symptoms include cough. Pertinent negatives for primary symptoms include no fever, no shortness of breath and no nausea.  Pertinent negatives for associated symptoms include no diaphoresis. He tried nothing for the symptoms.  His past medical history is significant for CAD.   Pt has history of CAD.  This feels similar to prior heart problems.  Pt had CABG on May 12. He has had a bad cough for the last couple of weeks.  Past Medical History  Diagnosis Date  . Coronary artery disease     s/p 4V CABG 12/10/11 (LIMA to LAD, sequential SVG to OM1 & OM2, SVG to PDA)  . HLD (hyperlipidemia)   . History of tobacco abuse     0.5 packs/day for 25 years, quit 2005  . Fibromyalgia   . Chronic neck pain   . Headache   . Acute prostatitis   . Acute pyelonephritis   . Pericardial effusion     echo 12/29/11 small to moderate pericardial effusion primarily posteriorly and anterolaterally    Past Surgical History  Procedure Date  . Coronary artery bypass graft 12/10/2011    Procedure: CORONARY ARTERY BYPASS GRAFTING (CABG);  Surgeon: Delight Ovens, MD;  Location: [facility] OR;  Service: Open Heart Surgery;  Laterality: N/A;  Times four  using endoscopically harvested right greater saphenous and left internal mammary artery.    History reviewed. No pertinent family  history.  History  Substance Use Topics  . Smoking status: Former Smoker -- 0.5 packs/day for 25 years    Types: Cigarettes    Quit date: 12/07/2003  . Smokeless tobacco: Not on file  . Alcohol Use: No      Review of Systems  Constitutional: Negative for fever and diaphoresis.  Respiratory: Positive for cough. Negative for shortness of breath.   Cardiovascular: Positive for chest pain.  Gastrointestinal: Negative for nausea.  All other systems reviewed and are negative.    Allergies  Review of patient's allergies indicates no known allergies.  Home Medications   Current Outpatient Rx  Name Route Sig Dispense Refill  . ASPIRIN 325 MG PO TBEC Oral Take 325 mg by mouth daily.    . ATORVASTATIN CALCIUM 40 MG PO TABS Oral Take 40 mg by mouth daily.    . AZITHROMYCIN 250 MG PO TABS Oral Take 250-500 mg by mouth See admin instructions. Take 500mg  on day 1 then 250mg  daily x4 days    . BISACODYL 5 MG PO TBEC Oral Take 5-10 mg by mouth daily.     [name] HYDROCODONE-HOMATROPINE 5-1.5 MG/5ML PO SYRP Oral Take 5 mLs by mouth every 4 (four) hours as needed. For cough    . METOPROLOL TARTRATE 25 MG PO TABS Oral Take 25 mg by mouth 3 (three) times daily.  BP 137/94  Pulse 89  Temp 97.9 F (36.6 C) (Oral)  Resp 22  SpO2 98%  Physical Exam  Nursing note and vitals reviewed. Constitutional: He appears well-developed and well-nourished. No distress.  HENT:  Head: Normocephalic and atraumatic.  Right Ear: External ear normal.  Left Ear: External ear normal.  Eyes: Conjunctivae are normal. Right eye exhibits no discharge. Left eye exhibits no discharge. No scleral icterus.  Neck: Neck supple. No tracheal deviation present.  Cardiovascular: Normal rate, regular rhythm and intact distal pulses.   Pulmonary/Chest: Effort normal and breath sounds normal. No stridor. No respiratory distress. He has no wheezes. He has no rales. He exhibits tenderness (healing sternotomy scar).  Abdominal:  Soft. Bowel sounds are normal. He exhibits no distension. There is no tenderness. There is no rebound and no guarding.  Musculoskeletal: He exhibits no edema and no tenderness.  Neurological: He is alert. He has normal strength. No sensory deficit. Cranial nerve deficit:  no gross defecits noted. He exhibits normal muscle tone. He displays no seizure activity. Coordination normal.  Skin: Skin is warm and dry. No rash noted.  Psychiatric: He has a normal mood and affect.    ED Course  Procedures (including critical care time)  Rate: 89  Rhythm: normal sinus rhythm  QRS Axis: normal  Intervals: normal  ST/T Wave abnormalities: normal  Conduction Disutrbances:none  Narrative Interpretation: inferior q waves. No acute st t changes  Old EKG Reviewed: none available  Labs Reviewed  COMPREHENSIVE METABOLIC PANEL - Abnormal; Notable for the following:    GFR calc non Af Amer 67 (*)     GFR calc Af Amer 77 (*)     All other components within normal limits  POCT I-STAT TROPONIN I - Abnormal; Notable for the following:    Troponin i, poc 0.10 (*)     All other components within normal limits  CBC  PROTIME-INR  APTT   Dg Chest 2 View  01/13/2012  *RADIOLOGY REPORT*  Clinical Data: History of CABG.  Cough.  CHEST - 2 VIEW  Comparison: CT and plain films of the chest 12/29/2011.  Findings: The lungs are clear.  Heart size is upper normal. Small left pleural effusion seen on the comparison plain films has resolved.  No pneumothorax or pleural effusion.  The patient status post CABG.  IMPRESSION: No acute disease.  Original Report Authenticated By: Bernadene Bell. D'ALESSIO, M.D.   Dg Chest Portable 1 View  01/13/2012  *RADIOLOGY REPORT*  Clinical Data: Chest pain  PORTABLE CHEST - 1 VIEW  Comparison: 01/13/2012  Findings: Cardiomediastinal silhouette is stable.  Status post median sternotomy again noted.  No acute infiltrate or pleural effusion.  No pulmonary edema.  Bony thorax is stable.  IMPRESSION:  No active disease.  No significant change.  Original Report Authenticated By: Natasha Mead, M.D.     MDM  The patient presents with recurrent chest pain. He has recent history of a four-vessel bypass on May 11. Patient has been having cough as well but at this time there is no evidence of pneumonia or acute pulmonary event. Considering his cardiac history, his slight increase in his troponin level in the symptoms he is describing I will consult with his cardiology service regarding admission for observation and serial cardiac enzymes.        Celene Kras, MD 01/13/12 2017

## 2012-01-13 NOTE — Patient Instructions (Addendum)
May drive  no lifting over 25 lbs for 3 months                                                                  Coronary Artery Bypass Grafting Care After Refer to this sheet in the next few weeks. These instructions provide you with information on caring for yourself after your procedure. Your caregiver may also give you more specific instructions. Your treatment has been planned according to current medical practices, but problems sometimes occur. Call your caregiver if you have any problems or questions after your procedure.   Recovery from open heart surgery will be different for everyone. Some people feel well after 3 or 4 weeks, while for others it takes longer. After heart surgery, it may be normal to:  Not have an appetite, feel nauseated by the smell of food, or only want to eat a small amount.   Be constipated because of changes in your diet, activity, and medicines. Eat foods high in fiber. Add fresh fruits and vegetables to your diet. Stool softeners may be helpful.   Feel sad or unhappy. You may be frustrated or cranky. You may have good days and bad days. Do not give up. Talk to your caregiver if you do not feel better.   Feel weakness and fatigue. You many need physical therapy or cardiac rehabilitation to get your strength back.   Develop an irregular heartbeat called atrial fibrillation. Symptoms of atrial fibrillation are a fast, irregular heartbeat or feelings of fluttery heartbeats, shortness of breath, low blood pressure, and dizziness. If these symptoms develop, see your caregiver right away.  MEDICATION  Have a list of all the medicines you will be taking when you leave the hospital. For every medicine, know the following:   Name.   Exact dose.   Time of day to be taken.   How often it should be taken.   Why you are taking it.   Ask which medicines should or should not be taken together. If you take more than one heart medicine, ask if it is okay to take them  together. Some heart medicines should not be taken at the same time because they may lower your blood pressure too much.   Narcotic pain medicine can cause constipation. Eat fresh fruits and vegetables. Add fiber to your diet. Stool softener medicine may help relieve constipation.   Keep a copy of your medicines with you at all times.   Do not add or stop taking any medicine until you check with your caregiver.   Medicines can have side effects. Call your caregiver who prescribed the medicine if you:   Start throwing up, have diarrhea, or have stomach pain.   Feel dizzy or lightheaded when you stand up.   Feel your heart is skipping beats or is beating too fast or too slow.   Develop a rash.   Notice unusual bruising or bleeding.  HOME CARE INSTRUCTIONS  After heart surgery, it is important to learn how to take your pulse. Have your caregiver show you how to take your pulse.   Use your incentive spirometer. Ask your caregiver how long after surgery you need to use it.  Care of your chest incision  Tell your caregiver right  away if you notice clicking in your chest (sternum).   Support your chest with a pillow or your arms when you take deep breaths and cough.   Follow your caregiver's instructions about when you can bathe or swim.   Protect your incision from sunlight during the first year to keep the scar from getting dark.   Tell your caregiver if you notice:   Increased tenderness of your incision.   Increased redness or swelling around your incision.   Drainage or pus from your incision.  Care of your leg incision(s)  Avoid crossing your legs.   Avoid sitting for long periods of time. Change positions every half hour.   Elevate your leg(s) when you are sitting.   Check your leg(s) daily for swelling. Check the incisions for redness or drainage.   Wear your elastic stockings as told by your caregiver. Take them off at bedtime.  Diet  Diet is very important to  heart health.   Eat plenty of fresh fruits and vegetables. Meats should be lean cut. Avoid canned, processed, and fried foods.   Talk to a dietician. They can teach you how to make healthy food and drink choices.  Weight  Weigh yourself every day. This is important because it helps to know if you are retaining fluid that may make your heart and lungs work harder.   Use the same scale each time.   Weigh yourself every morning at the same time. You should do this after you go to the bathroom, but before you eat breakfast.   Your weight will be more accurate if you do not wear any clothes.   Record your weight.   Tell your caregiver if you have gained 2 pounds or more overnight.  Activity Stop any activity at once if you have chest pain, shortness of breath, irregular heartbeats, or dizziness. Get help right away if you have any of these symptoms.  Bathing.  Avoid soaking in a bath or hot tub until your incisions are healed.   Rest. You need a balance of rest and activity.   Exercise. Exercise per your caregiver's advice. You may need physical therapy or cardiac rehabilitation to help strengthen your muscles and build your endurance.   Climbing stairs. Unless your caregiver tells you not to climb stairs, go up stairs slowly and rest if you tire. Do not pull yourself up by the handrail.   Driving a car. Follow your caregiver's advice on when you may drive. You may ride as a passenger at any time. When traveling for long periods of time in a car, get out of the car and walk around for a few minutes every 2 hours.   Lifting. Avoid lifting, pushing, or pulling anything heavier than 10 pounds for 6 weeks after surgery or as told by your caregiver.   Returning to work. Check with your caregiver. People heal at different rates. Most people will be able to go back to work 6 to 12 weeks after surgery.   Sexual activity. You may resume sexual relations as told by your caregiver.  SEEK MEDICAL  CARE IF:  Any of your incisions are red, painful, or have any type of drainage coming from them.   You have an oral temperature above 102 F (38.9 C).   You have ankle or leg swelling.   You have pain in your legs.   You have weight gain of 2 or more pounds a day.   You feel dizzy or lightheaded when  you stand up.  SEEK IMMEDIATE MEDICAL CARE IF:  You have angina or chest pain that goes to your jaw or arms. Call your local emergency services right away.   You have shortness of breath at rest or with activity.   You have a fast or irregular heartbeat (arrhythmia).   There is a "clicking" in your sternum when you move.   You have numbness or weakness in your arms or legs.  MAKE SURE YOU:  Understand these instructions.   Will watch your condition.   Will get help right away if you are not doing well or get worse.  Document Released: 02/05/2005 Document Revised: 07/08/2011 Document Reviewed: 09/23/2010 [facility] Patient Information 2012 [place], [place].

## 2012-01-14 ENCOUNTER — Encounter (HOSPITAL_COMMUNITY)
Admission: EM | Disposition: A | Discharge status: Home / Self Care | Payer: Self-pay | Source: Ambulatory Visit | Attending: Internal Medicine

## 2012-01-14 DIAGNOSIS — I214 Non-ST elevation (NSTEMI) myocardial infarction: Secondary | ICD-10-CM

## 2012-01-14 DIAGNOSIS — I319 Disease of pericardium, unspecified: Secondary | ICD-10-CM

## 2012-01-14 DIAGNOSIS — I25810 Atherosclerosis of coronary artery bypass graft(s) without angina pectoris: Secondary | ICD-10-CM

## 2012-01-14 DIAGNOSIS — IMO0001 Reserved for inherently not codable concepts without codable children: Secondary | ICD-10-CM

## 2012-01-14 HISTORY — PX: LEFT HEART CATHETERIZATION WITH CORONARY ANGIOGRAM: SHX5451

## 2012-01-14 HISTORY — PX: PERCUTANEOUS CORONARY STENT INTERVENTION (PCI-S): SHX5485

## 2012-01-14 LAB — CARDIAC PANEL(CRET KIN+CKTOT+MB+TROPI)
CK, MB: 18.5 ng/mL (ref 0.3–4.0)
Relative Index: 6.6 — ABNORMAL HIGH (ref 0.0–2.5)
Relative Index: 8.7 — ABNORMAL HIGH (ref 0.0–2.5)
Total CK: 101 U/L (ref 7–232)
Troponin I: 0.79 ng/mL (ref <0.30)
Troponin I: 2.93 ng/mL (ref <0.30)

## 2012-01-14 LAB — CBC
HCT: 37.2 % — ABNORMAL LOW (ref 39.0–52.0)
Hemoglobin: 12.8 g/dL — ABNORMAL LOW (ref 13.0–17.0)
MCHC: 34.4 g/dL (ref 30.0–36.0)
MCV: 82.9 fL (ref 78.0–100.0)
RDW: 12.6 % (ref 11.5–15.5)

## 2012-01-14 LAB — LIPID PANEL
Cholesterol: 151 mg/dL (ref 0–200)
Triglycerides: 109 mg/dL (ref <150)

## 2012-01-14 LAB — HEPARIN LEVEL (UNFRACTIONATED): Heparin Unfractionated: <0.1 IU/mL — ABNORMAL LOW (ref 0.30–0.70)

## 2012-01-14 LAB — POCT ACTIVATED CLOTTING TIME: Activated Clotting Time: 514 seconds

## 2012-01-14 SURGERY — LEFT HEART CATHETERIZATION WITH CORONARY ANGIOGRAM
Anesthesia: LOCAL

## 2012-01-14 MED ORDER — BIVALIRUDIN 250 MG IV SOLR
INTRAVENOUS | Status: AC
  Filled 2012-01-14: qty 250

## 2012-01-14 MED ORDER — HEPARIN (PORCINE) IN NACL 100-0.45 UNIT/ML-% IJ SOLN
1000.0000 [IU]/h | INTRAMUSCULAR | Status: DC
  Administered 2012-01-14: 1000 [IU]/h via INTRAVENOUS
  Filled 2012-01-14: qty 250

## 2012-01-14 MED ORDER — MORPHINE SULFATE 2 MG/ML IJ SOLN
INTRAMUSCULAR | Status: AC
  Filled 2012-01-14: qty 1

## 2012-01-14 MED ORDER — MORPHINE SULFATE 2 MG/ML IJ SOLN
2.0000 mg | Freq: Once | INTRAMUSCULAR | Status: AC
  Administered 2012-01-14: 2 mg via INTRAVENOUS

## 2012-01-14 MED ORDER — MIDAZOLAM HCL 2 MG/2ML IJ SOLN
INTRAMUSCULAR | Status: AC
  Filled 2012-01-14: qty 2

## 2012-01-14 MED ORDER — SODIUM CHLORIDE 0.9 % IV SOLN
INTRAVENOUS | Status: AC
  Administered 2012-01-14: 14:00:00 via INTRAVENOUS

## 2012-01-14 MED ORDER — TICAGRELOR 90 MG PO TABS
90.0000 mg | ORAL_TABLET | Freq: Two times a day (BID) | ORAL | Status: DC
  Filled 2012-01-14 (×2): qty 1

## 2012-01-14 MED ORDER — TICAGRELOR 90 MG PO TABS
90.0000 mg | ORAL_TABLET | Freq: Two times a day (BID) | ORAL | Status: DC
  Filled 2012-01-14: qty 1

## 2012-01-14 MED ORDER — ASPIRIN 81 MG PO CHEW
324.0000 mg | CHEWABLE_TABLET | ORAL | Status: AC
  Administered 2012-01-14: 324 mg via ORAL
  Filled 2012-01-14: qty 4

## 2012-01-14 MED ORDER — FENTANYL CITRATE 0.05 MG/ML IJ SOLN
INTRAMUSCULAR | Status: AC
  Filled 2012-01-14: qty 2

## 2012-01-14 MED ORDER — SODIUM CHLORIDE 0.9 % IV SOLN: 250.0000 mL | INTRAVENOUS | Status: DC | PRN

## 2012-01-14 MED ORDER — SODIUM CHLORIDE 0.9 % IJ SOLN
3.0000 mL | Freq: Two times a day (BID) | INTRAMUSCULAR | Status: DC
  Administered 2012-01-14: 3 mL via INTRAVENOUS

## 2012-01-14 MED ORDER — TICAGRELOR 90 MG PO TABS
180.0000 mg | ORAL_TABLET | Freq: Once | ORAL | Status: AC
  Administered 2012-01-14: 180 mg via ORAL
  Filled 2012-01-14: qty 2

## 2012-01-14 MED ORDER — NITROGLYCERIN IN D5W 200-5 MCG/ML-% IV SOLN
5.0000 ug/min | INTRAVENOUS | Status: DC
  Administered 2012-01-14: 5 ug/min via INTRAVENOUS

## 2012-01-14 MED ORDER — NITROGLYCERIN 0.2 MG/ML ON CALL CATH LAB
INTRAVENOUS | Status: AC
  Filled 2012-01-14: qty 1

## 2012-01-14 MED ORDER — SODIUM CHLORIDE 0.9 % IV SOLN: INTRAVENOUS | Status: DC

## 2012-01-14 MED ORDER — LIDOCAINE HCL (PF) 1 % IJ SOLN
INTRAMUSCULAR | Status: AC
  Filled 2012-01-14: qty 30

## 2012-01-14 MED ORDER — DM-GUAIFENESIN ER 30-600 MG PO TB12
1.0000 | ORAL_TABLET | Freq: Two times a day (BID) | ORAL | Status: DC | PRN
  Administered 2012-01-14: 1 via ORAL
  Filled 2012-01-14: qty 1

## 2012-01-14 MED ORDER — NITROGLYCERIN IN D5W 200-5 MCG/ML-% IV SOLN
INTRAVENOUS | Status: AC
  Filled 2012-01-14: qty 250

## 2012-01-14 MED ORDER — SODIUM CHLORIDE 0.9 % IJ SOLN: 3.0000 mL | INTRAMUSCULAR | Status: DC | PRN

## 2012-01-14 MED ORDER — ASPIRIN EC 81 MG PO TBEC
81.0000 mg | DELAYED_RELEASE_TABLET | Freq: Every day | ORAL | Status: DC
  Filled 2012-01-14 (×2): qty 1

## 2012-01-14 MED ORDER — HEPARIN BOLUS VIA INFUSION
3000.0000 [IU] | Freq: Once | INTRAVENOUS | Status: AC
  Administered 2012-01-14: 3000 [IU] via INTRAVENOUS
  Filled 2012-01-14: qty 3000

## 2012-01-14 MED ORDER — HEPARIN (PORCINE) IN NACL 2-0.9 UNIT/ML-% IJ SOLN
INTRAMUSCULAR | Status: AC
  Filled 2012-01-14: qty 2000

## 2012-01-14 NOTE — Progress Notes (Signed)
  Echocardiogram 2D Echocardiogram limited has been performed.  Tejah Brekke, Real Cons 01/14/2012, 4:14 PM

## 2012-01-14 NOTE — Progress Notes (Signed)
Provided Brilinta card to pt.  His pharmacy, [facility] [address] has Brilinta in stock.

## 2012-01-14 NOTE — Progress Notes (Signed)
Pt was resting in bed and stated he was having chest pain 4/10. He described the pain as an ache in his chest. Pt's VS stable. An ekg was obtained and 2l of 02 was applied. Md on call made aware. New orders received. Will cont to monitor pt.

## 2012-01-14 NOTE — H&P (View-Only) (Signed)
    SUBJECTIVE:  Mild chest pain this am. No SOB  BP 145/88  Pulse 84  Temp 98.6 F (37 C) (Oral)  Resp 15  SpO2 97%  Intake/Output Summary (Last 24 hours) at 01/14/12 0716 Last data filed at 01/13/12 2117  Gross per 24 hour  Intake    500 ml  Output      0 ml  Net    500 ml    PHYSICAL EXAM General: Well developed, well nourished, in no acute distress. Alert and oriented x 3.  Psych:  Good affect, responds appropriately  LABS: Basic Metabolic Panel:  Basename 01/13/12 2254 01/13/12 1610  NA -- 139  K -- 4.2  CL -- 103  CO2 -- 27  GLUCOSE -- 97  BUN -- 18  CREATININE 1.08 1.19  CALCIUM -- 10.4  MG -- --  PHOS -- --   CBC:  Basename 01/13/12 2254 01/13/12 1610  WBC 8.1 7.9  NEUTROABS -- --  HGB 12.9* 13.9  HCT 38.9* 40.8  MCV 84.4 84.1  PLT 210 213   Cardiac Enzymes:  Basename 01/14/12 0500 01/13/12 2254  CKTOTAL 212 101  CKMB 18.5* 6.7*  CKMBINDEX -- --  TROPONINI 2.93* 0.79*   Fasting Lipid Panel:  Basename 01/14/12 0500  CHOL 151  HDL 35*  LDLCALC 94  TRIG 811  CHOLHDL 4.3  LDLDIRECT --    Current Meds:    . aspirin  324 mg Oral Pre-Cath  . aspirin  81 mg Oral Daily  . atorvastatin  40 mg Oral Daily  . bisacodyl  5-10 mg Oral Daily  . heparin  3,000 Units Intravenous Once  . metoprolol tartrate  25 mg Oral Q8H  .  morphine injection  2 mg Intravenous Once  . morphine      . nitroGLYCERIN      . sodium chloride  500 mL Intravenous Once  . sodium chloride  3 mL Intravenous Q12H  . Ticagrelor  180 mg Oral Once  . Ticagrelor  90 mg Oral BID  . DISCONTD: aspirin  324 mg Oral Once  . DISCONTD: aspirin  324 mg Oral NOW  . DISCONTD: aspirin  325 mg Oral Daily  . DISCONTD: aspirin EC  81 mg Oral Daily  . DISCONTD: aspirin  300 mg Rectal NOW  . DISCONTD: enoxaparin  40 mg Subcutaneous Daily  . DISCONTD: nitroGLYCERIN  1 inch Topical Q6H     ASSESSMENT AND PLAN:  1. CAD: s/p recent CABG (Coronary artery bypass grafting x4 with left  internal mammary to the left anterior descending coronary artery, sequential reverse saphenous vein graft to the first and second obtuse marginal, reverse saphenous vein graft to the posterior descending coronary artery) on 12/16/11 per Dr. Tyrone Sage. Admitted now with chest pain, NSTEMI. Plans for cath this am. Labs reviewed.  He has been loaded with Brilinta.     Michael Spears  6/14/20137:16 AM

## 2012-01-14 NOTE — Progress Notes (Signed)
Md made aware of critical values. New orders received. Will cont to monitor pt.

## 2012-01-14 NOTE — Progress Notes (Signed)
    SUBJECTIVE:  Mild chest pain this am. No SOB  BP 145/88  Pulse 84  Temp 98.6 F (37 C) (Oral)  Resp 15  SpO2 97%  Intake/Output Summary (Last 24 hours) at 01/14/12 0716 Last data filed at 01/13/12 2117  Gross per 24 hour  Intake    500 ml  Output      0 ml  Net    500 ml    PHYSICAL EXAM General: Well developed, well nourished, in no acute distress. Alert and oriented x 3.  Psych:  Good affect, responds appropriately  LABS: Basic Metabolic Panel:  Basename 01/13/12 2254 01/13/12 1610  NA -- 139  K -- 4.2  CL -- 103  CO2 -- 27  GLUCOSE -- 97  BUN -- 18  CREATININE 1.08 1.19  CALCIUM -- 10.4  MG -- --  PHOS -- --   CBC:  Basename 01/13/12 2254 01/13/12 1610  WBC 8.1 7.9  NEUTROABS -- --  HGB 12.9* 13.9  HCT 38.9* 40.8  MCV 84.4 84.1  PLT 210 213   Cardiac Enzymes:  Basename 01/14/12 0500 01/13/12 2254  CKTOTAL 212 101  CKMB 18.5* 6.7*  CKMBINDEX -- --  TROPONINI 2.93* 0.79*   Fasting Lipid Panel:  Basename 01/14/12 0500  CHOL 151  HDL 35*  LDLCALC 94  TRIG 109  CHOLHDL 4.3  LDLDIRECT --    Current Meds:    . aspirin  324 mg Oral Pre-Cath  . aspirin  81 mg Oral Daily  . atorvastatin  40 mg Oral Daily  . bisacodyl  5-10 mg Oral Daily  . heparin  3,000 Units Intravenous Once  . metoprolol tartrate  25 mg Oral Q8H  .  morphine injection  2 mg Intravenous Once  . morphine      . nitroGLYCERIN      . sodium chloride  500 mL Intravenous Once  . sodium chloride  3 mL Intravenous Q12H  . Ticagrelor  180 mg Oral Once  . Ticagrelor  90 mg Oral BID  . DISCONTD: aspirin  324 mg Oral Once  . DISCONTD: aspirin  324 mg Oral NOW  . DISCONTD: aspirin  325 mg Oral Daily  . DISCONTD: aspirin EC  81 mg Oral Daily  . DISCONTD: aspirin  300 mg Rectal NOW  . DISCONTD: enoxaparin  40 mg Subcutaneous Daily  . DISCONTD: nitroGLYCERIN  1 inch Topical Q6H     ASSESSMENT AND PLAN:  1. CAD: s/p recent CABG (Coronary artery bypass grafting x4 with left  internal mammary to the left anterior descending coronary artery, sequential reverse saphenous vein graft to the first and second obtuse marginal, reverse saphenous vein graft to the posterior descending coronary artery) on 12/16/11 per Dr. Gerhardt. Admitted now with chest pain, NSTEMI. Plans for cath this am. Labs reviewed.  He has been loaded with Brilinta.     Andreina Outten  6/14/20137:16 AM  

## 2012-01-14 NOTE — Progress Notes (Signed)
ANTICOAGULATION CONSULT NOTE - Initial Consult  Pharmacy Consult for Heparin Indication: chest pain/ACS  No Known Allergies  Patient Measurements:   Heparin Dosing Weight: 81 kg   Vital Signs: Temp: 98 F (36.7 C) (06/13 2159) Temp src: Oral (06/13 1801) BP: 120/72 mmHg (06/13 2159) Pulse Rate: 76  (06/13 2159)  Labs:  Basename 01/13/12 2254 01/13/12 1610  HGB 12.9* 13.9  HCT 38.9* 40.8  PLT 210 213  APTT -- 33  LABPROT -- 14.0  INR -- 1.06  HEPARINUNFRC -- --  CREATININE 1.08 1.19  CKTOTAL 101 --  CKMB 6.7* --  TROPONINI 0.79* --    The CrCl is unknown because both a height and weight (above a minimum accepted value) are required for this calculation.   Medical History: Past Medical History  Diagnosis Date  . Coronary artery disease     s/p 4V CABG 12/10/11 (LIMA to LAD, sequential SVG to OM1 & OM2, SVG to PDA)  . HLD (hyperlipidemia)   . History of tobacco abuse     0.5 packs/day for 25 years, quit 2005  . Fibromyalgia   . Chronic neck pain   . Headache   . Acute prostatitis   . Acute pyelonephritis   . Pericardial effusion     echo 12/29/11 small to moderate pericardial effusion primarily posteriorly and anterolaterally    Medications:  Prescriptions prior to admission  Medication Sig Dispense Refill  . aspirin 325 MG EC tablet Take 325 mg by mouth daily.      [name] atorvastatin (LIPITOR) 40 MG tablet Take 40 mg by mouth daily.      [name] azithromycin (ZITHROMAX) 250 MG tablet Take 250-500 mg by mouth See admin instructions. Take 500mg  on day 1 then 250mg  daily x4 days      . bisacodyl (DULCOLAX) 5 MG EC tablet Take 5-10 mg by mouth daily.       [name] HYDROcodone-homatropine (HYCODAN) 5-1.5 MG/5ML syrup Take 5 mLs by mouth every 4 (four) hours as needed. For cough      . metoprolol tartrate (LOPRESSOR) 25 MG tablet Take 25 mg by mouth 3 (three) times daily.        Assessment: 56 yo male with chest pain/elevated cardiac markers s/p recent OHS 12/10/11 for  Heparin  Goal of Therapy:  Heparin level 0.3-0.7 units/ml Monitor platelets by anticoagulation protocol: Yes   Plan:  Heparin 3000 units IV bolus, then 1000 units/hr Check heparin level in 6 hours.  Eddie Candle 01/14/2012,12:48 AM

## 2012-01-14 NOTE — Interval H&P Note (Signed)
History and Physical Interval Note:  01/14/2012 9:17 AM  Michael Spears  has presented today for surgery, with the diagnosis of chest pain  The various methods of treatment have been discussed with the patient and family. After consideration of risks, benefits and other options for treatment, the patient has consented to  Procedure(s) (LRB): LEFT HEART CATHETERIZATION WITH CORONARY ANGIOGRAM (N/A) as a surgical intervention .  The patients' history has been reviewed, patient examined, no change in status, stable for surgery.  I have reviewed the patients' chart and labs.  Questions were answered to the patient's satisfaction.     Endiya Klahr

## 2012-01-14 NOTE — Care Management Note (Signed)
    Page 1 of 1   01/14/2012     12:03:24 PM   CARE MANAGEMENT NOTE 01/14/2012  Patient:  Michael Spears, Michael Spears   Account Number:  [id number]  Date Initiated:  01/14/2012  Documentation initiated by:  Junius Creamer  Subjective/Objective Assessment:   adm w ch pain     Action/Plan:   lives w wife, pcp dr vyvyan sun   Anticipated DC Date:     Anticipated DC Plan:        DC Planning Services  CM consult      Choice offered to / List presented to:             Status of service:   Medicare Important Message given?   (If response is "NO", the following Medicare IM given date fields will be blank) Date Medicare IM given:   Date Additional Medicare IM given:    Discharge Disposition:    Per UR Regulation:  Reviewed for med. necessity/level of care/duration of stay  If discussed at Long Length of Stay Meetings, dates discussed:    Comments:  6/14 12:01p debbie Mayia Megill rn, bsn had cm sec check on copay for brilinta. pt has 127.45 copay w [employer]. gave brlintal card for 30days free and poss only 18.00 per month card to cm on 2500 unit to give to pt.

## 2012-01-14 NOTE — Progress Notes (Signed)
Patient ID: Michael Spears, male   DOB: 09/14/1955, 56 y.o.   MRN: [id number] [facility] DAILY PROGRESS NOTE                   [address]            [place] [address]          [phone]      Day of Surgery Procedure(s) (LRB): LEFT HEART CATHETERIZATION WITH CORONARY ANGIOGRAM (N/A) PERCUTANEOUS CORONARY STENT INTERVENTION (PCI-S) ()  Total Length of Stay:  LOS: 1 day   Subjective: Stable with out chest pain now. After in the office yesterday presented with new chest pain last night with elevated enzymes  Objective: Vital signs in last 24 hours: Temp:  [97.9 F (36.6 C)-98.8 F (37.1 C)] 98.2 F (36.8 C) (06/14 1233) Pulse Rate:  [57-95] 84  (06/14 1233) Cardiac Rhythm:  [-] Normal sinus rhythm (06/14 1400) Resp:  [11-22] 16  (06/14 1233) BP: (77-146)/(42-94) 146/87 mmHg (06/14 1233) SpO2:  [95 %-98 %] 98 % (06/14 1233)  There were no vitals filed for this visit.  Weight change:    Hemodynamic parameters for last 24 hours:    Intake/Output from previous day: 06/13 0701 - 06/14 0700 In: 648.8 [I.V.:648.8] Out: -   Intake/Output this shift: Total I/O In: 419 [P.O.:240; I.V.:179] Out: -   Current Meds: Scheduled Meds:   . aspirin  324 mg Oral Pre-Cath  . aspirin  81 mg Oral Daily  . atorvastatin  40 mg Oral Daily  . bisacodyl  5-10 mg Oral Daily  . bivalirudin      . fentaNYL      . heparin      . heparin  3,000 Units Intravenous Once  . lidocaine      . metoprolol tartrate  25 mg Oral Q8H  . midazolam      . midazolam      .  morphine injection  2 mg Intravenous Once  . morphine      . nitroGLYCERIN      . nitroGLYCERIN      . sodium chloride  500 mL Intravenous Once  . Ticagrelor  180 mg Oral Once  . Ticagrelor  90 mg Oral BID   Continuous Infusions:   . sodium chloride 50 mL/hr at 01/14/12 1330  . DISCONTD: sodium chloride 1,000 mL (01/13/12 2042)  . DISCONTD: sodium chloride    . DISCONTD: nitroGLYCERIN Stopped (01/14/12 1034)    PRN Meds:.acetaminophen, dextromethorphan-guaiFENesin, nitroGLYCERIN, ondansetron (ZOFRAN) IV, DISCONTD: sodium chloride, DISCONTD: nitroGLYCERIN, DISCONTD: sodium chloride  General appearance: alert and cooperative Neurologic: intact Heart: regular rate and rhythm, S1, S2 normal, no murmur, click, rub or gallop and normal apical impulse Lungs: clear to auscultation bilaterally and normal percussion bilaterally Abdomen: soft, non-tender; bowel sounds normal; no masses,  no organomegaly Wound: sternum well healed  Lab Results: CBC: Basename 01/14/12 0847 01/13/12 2254  WBC 6.8 8.1  HGB 12.8* 12.9*  HCT 37.2* 38.9*  PLT 198 210   BMET:  Basename 01/13/12 2254 01/13/12 1610  NA -- 139  K -- 4.2  CL -- 103  CO2 -- 27  GLUCOSE -- 97  BUN -- 18  CREATININE 1.08 1.19  CALCIUM -- 10.4    PT/INR:  Basename 01/13/12 1610  LABPROT 14.0  INR 1.06   Radiology: Dg Chest 2 View  01/13/2012  *RADIOLOGY REPORT*  Clinical Data: History of CABG.  Cough.  CHEST - 2 VIEW  Comparison: CT and plain films of the chest 12/29/2011.  Findings: The lungs are clear.  Heart size is upper normal. Small left pleural effusion seen on the comparison plain films has resolved.  No pneumothorax or pleural effusion.  The patient status post CABG.  IMPRESSION: No acute disease.  Original Report Authenticated By: Bernadene Bell. D'ALESSIO, M.D.   Dg Chest Portable 1 View  01/13/2012  *RADIOLOGY REPORT*  Clinical Data: Chest pain  PORTABLE CHEST - 1 VIEW  Comparison: 01/13/2012  Findings: Cardiomediastinal silhouette is stable.  Status post median sternotomy again noted.  No acute infiltrate or pleural effusion.  No pulmonary edema.  Bony thorax is stable.  IMPRESSION: No active disease.  No significant change.  Original Report Authenticated By: Natasha Mead, M.D.     Assessment/Plan: S/P Procedure(s) (LRB): LEFT HEART CATHETERIZATION WITH CORONARY ANGIOGRAM (N/A) PERCUTANEOUS CORONARY STENT INTERVENTION (PCI-S)  ()  Cath films old and new reviewed. Graft to two om's occluded and angioplasty of cir done. LIMA patient and LAD now occluded. At time of surgery patient had diffuse distal disease in the rca and cir system likely adding to early occlusion. Will follow with you. Discussed with patient changing back to work recommendations I gave him yesterday.     Delight Ovens MD  Beeper [phone] Office [phone] 01/14/2012 4:13 PM

## 2012-01-14 NOTE — CV Procedure (Signed)
Cardiac Catheterization Operative Report  CLAUDIA ALVIZO [id number] 6/14/201311:12 AM Leanor Rubenstein, MD  Procedure Performed:  1. Left Heart Catheterization 2. Selective Coronary Angiography 3. SVG angiography 4. LIMA graft angiography 5. Left ventricular angiogram 6. PTCA/DES x 1 proximal Circumflex artery  Operator: Verne Carrow, MD  Indication: NSTEMI, 4V CABG 4 weeks ago. Chest pain started yesterday.                                        Procedure Details: The risks, benefits, complications, treatment options, and expected outcomes were discussed with the patient. The patient and/or family concurred with the proposed plan, giving informed consent. The patient was brought to the cath lab after IV hydration was begun and oral premedication was given. The patient was further sedated with Versed and Fentanyl. The right groin was prepped and draped in the usual manner. Using the modified Seldinger access technique, a 5 French sheath was placed in the right femoral artery. Standard diagnostic catheters were used to perform selective coronary angiography. The JR4 catheter was used to engage both vein grafts and the IMA graft. A pigtail catheter was used to perform a left ventricular angiogram.  The patient was found to have total occlusion of the SVG sequential to the OM1 and OM2. This has likely been occluded since yesterday. I did not think angioplasty of the occluded vein graft would be favorable as these are usually loaded with thrombus. I elected to approach the native vessel. He had been loaded with Brilinta yesterday. Angiomax was given as a bolus and a drip was started. I then engaged the left main with a XB 3.0 guide. I passed a Cougar IC wire down the Circumflex. I made multiple attempts to wire the small distal OM branch which has a 99% stenosis but could not cross this lesion with a wire. I then used a 2.5 x 12 mm balloon x 2 in the proximal vessel to open the severe  stenosis. I then deployed a 2.75 x 28 mm Promus Element DES in the proximal Circumflex. This was post-dilated with a 3.0 x 20 mm [place] balloon x 2. There was a good result with stenosis taken from 90% in two locations down to 0%. The first OM is small and occluded. The second OM is small and has 99% narrowing. These marginal branches cannot be approached percutaneously given small size. An Angioseal was deployed in the right femoral artery.   There were no immediate complications. The patient was taken to the recovery area in stable condition.   Hemodynamic Findings: Central aortic pressure: 89/62 Left ventricular pressure: 89/5/10  Left main: No obstructive disease.   Left Anterior Descending Artery: Large caliber vessel that courses to the apex. The proximal vessel has diffuse 40% stenosis. The mid vessel has 100% occlusion. The distal vessel fills from the graft. Moderate sized diagonal branch with 40% stenosis.   Circumflex Artery: Moderate sized vessel with serial 90% proximal stenosis. The first OM branch is small and is occluded at the ostium.  The second OM branch appears small and has a 99% stenosis.    Right Coronary Artery: Large, dominant vessel with diffuse 50% plaque throughout the proximal and mid segment. The mid vessel has a 100% stenosis. The distal vessel, PDA and PLA fills from the vein graft.   Graft Anatomy:  SVG to OM1 and OM2 is occluded SVG to PDA is  patent LIMA to mid LAD is patent.   Left Ventricular Angiogram: LVEF 60%  Impression: 1. 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  2. NSTEMI secondary to occlusion of sVG to OM1/OM2.  3. Successful PTCA/DES x 1 proximal Circumflex.  4. Preserved LV systolic function  Recommendations: ASA and Brilinta for one year. Continue other meds.        Complications:  None. The patient tolerated the procedure well.

## 2012-01-14 NOTE — Progress Notes (Signed)
CRITICAL VALUE ALERT  Critical value received:  CKMB  Date of notification: 01/14/2012  Time of notification:0012  Critical value read back: yes  Nurse who received alert:  Mellody Dance  MD notified (1st page):Dr. Bensimhon  Time of first page:  0015 MD notified (2nd page):  Time of second page:  Responding MD: Dr. Gala Romney  Time MD responded:  [phone]

## 2012-01-15 DIAGNOSIS — I252 Old myocardial infarction: Secondary | ICD-10-CM

## 2012-01-15 DIAGNOSIS — I214 Non-ST elevation (NSTEMI) myocardial infarction: Secondary | ICD-10-CM

## 2012-01-15 LAB — CBC
HCT: 40.1 % (ref 39.0–52.0)
Hemoglobin: 13.6 g/dL (ref 13.0–17.0)
MCH: 28.2 pg (ref 26.0–34.0)
MCHC: 33.9 g/dL (ref 30.0–36.0)
MCV: 83.2 fL (ref 78.0–100.0)
Platelets: 198 10*3/uL (ref 150–400)
RBC: 4.82 MIL/uL (ref 4.22–5.81)
RDW: 12.6 % (ref 11.5–15.5)
WBC: 8.4 10*3/uL (ref 4.0–10.5)

## 2012-01-15 LAB — BASIC METABOLIC PANEL
BUN: 11 mg/dL (ref 6–23)
CO2: 26 mEq/L (ref 19–32)
Calcium: 9.8 mg/dL (ref 8.4–10.5)
Chloride: 104 mEq/L (ref 96–112)
Creatinine, Ser: 1.13 mg/dL (ref 0.50–1.35)
GFR calc Af Amer: 82 mL/min — ABNORMAL LOW (ref >90)
GFR calc non Af Amer: 71 mL/min — ABNORMAL LOW (ref >90)
Glucose, Bld: 106 mg/dL — ABNORMAL HIGH (ref 70–99)
Potassium: 4.3 mEq/L (ref 3.5–5.1)
Sodium: 138 mEq/L (ref 135–145)

## 2012-01-15 MED ORDER — ASPIRIN 81 MG PO TBEC: 81.0000 mg | DELAYED_RELEASE_TABLET | Freq: Every day | ORAL | Status: DC

## 2012-01-15 MED ORDER — TICAGRELOR 90 MG PO TABS: 90.0000 mg | ORAL_TABLET | Freq: Two times a day (BID) | ORAL | Status: DC

## 2012-01-15 MED ORDER — NITROGLYCERIN 0.4 MG SL SUBL: 0.4000 mg | SUBLINGUAL_TABLET | SUBLINGUAL | Status: DC | PRN

## 2012-01-15 NOTE — Progress Notes (Signed)
Subjective:  Doing well after cath PCI/DES to proximal circumflex yesterday.  No chest pain.  Tolerating ambulation. Rhythm stable.  Still has dry cough.  Not on ACE  Objective:  Vital Signs in the last 24 hours: Temp:  [98.1 F (36.7 C)-98.5 F (36.9 C)] 98.2 F (36.8 C) (06/15 0400) Pulse Rate:  [57-96] 88  (06/15 0700) Resp:  [11-27] 16  (06/15 0700) BP: (106-151)/(64-133) 114/66 mmHg (06/15 0700) SpO2:  [92 %-100 %] 95 % (06/15 0700) Weight:  [83.1 kg (183 lb 3.2 oz)] 83.1 kg (183 lb 3.2 oz) (06/15 0400)  Intake/Output from previous day: 06/14 0701 - 06/15 0700 In: 1244 [P.O.:840; I.V.:404] Out: 700 [Urine:700] Intake/Output from this shift:       . aspirin  324 mg Oral Pre-Cath  . aspirin  81 mg Oral Daily  . atorvastatin  40 mg Oral Daily  . bisacodyl  5-10 mg Oral Daily  . bivalirudin      . fentaNYL      . heparin      . lidocaine      . metoprolol tartrate  25 mg Oral Q8H  . midazolam      . midazolam      . morphine      . nitroGLYCERIN      . nitroGLYCERIN      . Ticagrelor  90 mg Oral BID  . DISCONTD: sodium chloride  3 mL Intravenous Q12H  . DISCONTD: Ticagrelor  90 mg Oral BID      . sodium chloride 50 mL/hr at 01/14/12 1330  . DISCONTD: sodium chloride    . DISCONTD: heparin 1,000 Units/hr (01/14/12 0300)  . DISCONTD: nitroGLYCERIN Stopped (01/14/12 1034)    Physical Exam: The patient appears to be in no distress.  Head and neck exam reveals that the pupils are equal and reactive.  The extraocular movements are full.  There is no scleral icterus.  Mouth and pharynx are benign.  No lymphadenopathy.  No carotid bruits.  The jugular venous pressure is normal.  Thyroid is not enlarged or tender.  Chest is clear to percussion and auscultation.  No rales or rhonchi.  Expansion of the chest is symmetrical.  Heart reveals no abnormal lift or heave.  First and second heart sounds are normal.  There is no murmur gallop rub or click.  The abdomen is  soft and nontender.  Bowel sounds are normoactive.  There is no hepatosplenomegaly or mass.  There are no abdominal bruits. Right groin okay.  Extremities reveal no phlebitis or edema.  Pedal pulses are good.  There is no cyanosis or clubbing.  Neurologic exam is normal strength and no lateralizing weakness.  No sensory deficits.  Integument reveals no rash  Lab Results:  Basename 01/15/12 0410 01/14/12 0847  WBC 8.4 6.8  HGB 13.6 12.8*  PLT 198 198    Basename 01/15/12 0410 01/13/12 2254 01/13/12 1610  NA 138 -- 139  K 4.3 -- 4.2  CL 104 -- 103  CO2 26 -- 27  GLUCOSE 106* -- 97  BUN 11 -- 18  CREATININE 1.13 1.08 --    Basename 01/14/12 0500 01/13/12 2254  TROPONINI 2.93* 0.79*   Hepatic Function Panel  Basename 01/13/12 1610  PROT 6.9  ALBUMIN 4.3  AST 18  ALT 18  ALKPHOS 88  BILITOT 0.3  BILIDIR --  IBILI --    Basename 01/14/12 0500  CHOL 151   No results found for this basename: PROTIME in  the last 72 hours  Imaging: Dg Chest 2 View  01/13/2012  *RADIOLOGY REPORT*  Clinical Data: History of CABG.  Cough.  CHEST - 2 VIEW  Comparison: CT and plain films of the chest 12/29/2011.  Findings: The lungs are clear.  Heart size is upper normal. Small left pleural effusion seen on the comparison plain films has resolved.  No pneumothorax or pleural effusion.  The patient status post CABG.  IMPRESSION: No acute disease.  Original Report Authenticated By: Bernadene Bell. D'ALESSIO, M.D.   Dg Chest Portable 1 View  01/13/2012  *RADIOLOGY REPORT*  Clinical Data: Chest pain  PORTABLE CHEST - 1 VIEW  Comparison: 01/13/2012  Findings: Cardiomediastinal silhouette is stable.  Status post median sternotomy again noted.  No acute infiltrate or pleural effusion.  No pulmonary edema.  Bony thorax is stable.  IMPRESSION: No active disease.  No significant change.  Original Report Authenticated By: Natasha Mead, M.D.    Cardiac Studies: Telemetry stable. Assessment/Plan:  Patient Active  Hospital Problem List: NSTEMI secondary to occlusion of SVG to OM1/OM2  Plan: okay for discharge today.  He already has appointment to see Dr. [name] on Monday which he will keep. Quiet activity over the weekend.  May drive Monday.   LOS: 2 days    Cassell Clement 01/15/2012, 8:03 AM

## 2012-01-15 NOTE — Progress Notes (Signed)
CARDIAC REHAB PHASE I   PRE:  Rate/Rhythm: 120/73  BP:  Supine:   Sitting: 11914  Standing:    SaO2: 94 ra  MODE:  Ambulation: 671ft   POST:  Rate/Rhythem: 95 RA  BP:  Supine:   Sitting: 110/78  Standing:    SaO2: 96 RA  Bergen Magner Bailey  Pt on side of bed with no complaints of cp or sob.  Pt with persistent dry cough.  Pt known to Cardiac Rehab Phase II in f/u after CABG on 12/16/11.  Will plan to f/u next week to assess readiness for outpatient cardiac rehab program.  Per pt he will be seen in f/u on Monday 01/17/12.  Pt with no new educational needs and demonstrates an appropriate level of understanding for heart healthy lifestyle.  Pt tolerated ambulation well with some minor complaints of dizziness which pt attributed to limited nutritional intake leading up to his procedure and the day off.  Dizziness resolved quickly when placed in a seated position for breakfast.  Call bell in place, no other complaints.

## 2012-01-15 NOTE — Plan of Care (Signed)
Problem: Phase I Progression Outcomes Goal: Initial discharge plan identified Outcome: Completed/Met Date Met:  01/15/12 Planned discharge tomorrow Goal: Voiding-avoid urinary catheter unless indicated Outcome: Completed/Met Date Met:  01/15/12 Voiding in urinal no need for foley cath Goal: Hemodynamically stable Outcome: Completed/Met Date Met:  01/15/12 Patient hemodynamically stable Goal: Distal pulses equal to baseline Outcome: Completed/Met Date Met:  01/15/12 Palpable pulses radial and pedal pulses bilaterally Goal: Vascular site scale level 0 - I Vascular Site Scale Level 0: No bruising/bleeding/hematoma Level I (Mild): Bruising/Ecchymosis, minimal bleeding/ooozing, palpable hematoma < 3 cm Level II (Moderate): Bleeding not affecting hemodynamic parameters, pseudoaneurysm, palpable hematoma > 3 cm  Outcome: Completed/Met Date Met:  01/15/12 Right groin site level 0 Goal: Post Cath/PCI return to appropriate Path Outcome: Not Applicable Date Met:  01/15/12 No plan at this time that requires another care plan if not discharged will reevaluate

## 2012-01-15 NOTE — Progress Notes (Signed)
Pt. Complained of slight lite headed; orthostatics done; called to [facility], [place].

## 2012-01-15 NOTE — Discharge Instructions (Signed)
Coronary Angiography with Stent This is a procedure to widen or open a narrow blood vessel of the heart (coronary artery). When a coronary artery becomes partially blocked it decreases blood flow to that area. This may lead to chest pain or a heart attack (myocardial infarction). Arteries may become blocked by cholesterol buildup (plaque) in the lining or wall. A stent is a small piece of metal that looks like a mesh or a spring. Stent placement may be done right after an angiogram that finds a blocked artery or as a treatment for a heart attack. RISKS AND COMPLICATIONS  Damage to the heart.   A blockage may return.   Bleeding at the site.   Blood clot to another part of the body.  PROCEDURE  You may be given a medication to help you relax before and during the procedure through an IV in your hand or arm.   A local anesthetic to make the area numb may be used before inserting the catheter (a long, hollow tube about the size of a piece of cooked spaghetti).   You will be prepared for the procedure by washing and shaving the area where the catheter will be inserted. This is usually done in the groin.   A specially trained doctor will insert the catheter with a guide wire into an artery. This is guided under a special type of X-ray (fluoroscopy) to the opening of the blocked artery.   Special dye is then injected and X-rays are taken.   A tiny wire is guided to the blocked spot and a balloon is inflated to make the artery wider. The stent is expanded and crushes the plaque into the wall of the vessel. The stent holds the area open like a scaffolding and improves the blood flow.   Sometimes the artery may be made wider using a laser or other tools to remove plaque.   When the blood flow is better, the catheter is removed. The lining of the artery will grow over the stent which stays where it was placed.  AFTER THE PROCEDURE  You will stay in bed for several hours.   The access site will  be watched and you will be checked frequently.   Blood tests, X-rays and an EKG may be done.   You may stay in the hospital overnight for observation.  SEEK IMMEDIATE MEDICAL CARE IF:   You develop chest pain, shortness of breath, feel faint, or pass out.   There is bleeding, swelling, or drainage from the catheter insertion site.   You develop pain, discoloration, coldness, or severe bruising in the leg or arm that held the catheter.   You see blood in your urine or stool. This may be bright red blood in urine or stools, or also appear as black, tarry stools.   You have a fever.  Document Released: 01/23/2003 Document Revised: 07/08/2011 Document Reviewed: 09/15/2007 Exit[employer] Patient Information 2012 [employer]. 

## 2012-01-15 NOTE — Plan of Care (Signed)
Problem: Consults Goal: Skin Care Protocol Initiated - if indicated If consults are not indicated, leave blank or document N/A  Outcome: Not Applicable Date Met:  01/15/12 No skin issues noted Goal: Tobacco Cessation referral if indicated Outcome: Not Applicable Date Met:  01/15/12 Patient quit smoking in 2005 Goal: Nutrition Consult-if indicated Outcome: Not Applicable Date Met:  01/15/12 No noted nutritional needs, cardiac rehab reviews cardiac diet  Goal: Diabetes Guidelines if Diabetic/Glucose > 140 If diabetic or lab glucose is > 140 mg/dl - Initiate Diabetes/Hyperglycemia Guidelines & Document Interventions  Outcome: Not Applicable Date Met:  01/15/12 Patient not diabetic no consult needed

## 2012-01-15 NOTE — Discharge Summary (Signed)
Discharge Summary   Patient ID: Michael Spears MRN: [id number], DOB/AGE: Feb 03, 1956 56 y.o.  Primary MD: Leanor Rubenstein, MD Primary Cardiologist: Dr. [name] Admit date: 01/13/2012 D/C date:     01/15/2012      Primary Discharge Diagnoses:  1. NSTEMI/Coronary Artery Disease  - s/p 4v CABG 12/2011 (LIMA to LAD, SVG to OM1/OM2, SVG to PDA)  - Cath 6/14 revealed 2/4 patent grafts & occluded SVG to OM1/OM2 with successful PTCA/DES x1 prox LCx  Secondary Discharge Diagnoses:  1. Pericardial effusion - echo 12/29/11 small to moderate pericardial effusion primarily posteriorly and anterolaterally 2. Hyperlipidemia 3. History of tobacco abuse 0.5 packs/day for 25 years, quit 2005 4. Fibrromyalgia 5. Acute pyelonephritis  6. Acute prostatitis  7. Chronic neck pain  8. Headache   Allergies No Known Allergies  Diagnostic Studies/Procedures:   01/14/12 - Cardiac Cath Hemodynamic Findings:  Central aortic pressure: 89/62  Left ventricular pressure: 89/5/10  Left main: No obstructive disease.  Left Anterior Descending Artery: Large caliber vessel that courses to the apex. The proximal vessel has diffuse 40% stenosis. The mid vessel has 100% occlusion. The distal vessel fills from the graft. Moderate sized diagonal branch with 40% stenosis.  Circumflex Artery: Moderate sized vessel with serial 90% proximal stenosis. The first OM branch is small and is occluded at the ostium. The second OM branch appears small and has a 99% stenosis.  Right Coronary Artery: Large, dominant vessel with diffuse 50% plaque throughout the proximal and mid segment. The mid vessel has a 100% stenosis. The distal vessel, PDA and PLA fills from the vein graft.  Graft Anatomy:  SVG to OM1 and OM2 is occluded  SVG to PDA is patent  LIMA to mid LAD is patent.  Left Ventricular Angiogram: LVEF 60%  Impression:  1. 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  2. NSTEMI secondary to occlusion of sVG to  OM1/OM2.  3. Successful PTCA/DES x 1 proximal Circumflex.  4. Preserved LV systolic function  Recommendations: ASA and Brilinta for one year. Continue other meds.  01/14/12 - 2D Echocardiogram Study Conclusions: - Left ventricle: The cavity size was mildly reduced. Wall thickness was increased in a pattern of mild LVH. The estimated ejection fraction was 65%. Wall motion was normal; there were no regional wall motion abnormalities.  - Pericardium, extracardiac: A moderate pericardial effusion was identified circumferential to the heart. Features were not consistent with tamponade physiology. - Impressions: The effusion appears similar to the images of 12/29/2011.  History of Present Illness: 56 y.o. male w/ the above medical problems who presented to [facility] on 01/13/12 with complaints of chest pain.  He presented in early May 2013 with unstable angina. He underwent cardiac catheterization which revealed triple vessel disease. He then had 4v CABG on 12/10/11 with Dr. Tyrone Sage. He was discharged in stable conditions with no post-op complications.   He was admitted several weeks ago with dyspnea. CE negative. CT negative for PE. ECHO with normal function. Small to moderate effusion. Has done reasonable well since that time except for persistent non-productive cough and some chest soreness. Has not gone to cardiac rehab but now up to walking 8 blocks without CP.   Saw Dr. Tyrone Sage on day of presentation and was doing well. Later in the afternoon was sitting at home and developed burning in his chest which then turned into a tightness. No associated nausea, dyspnea, diaphoresis. CP persisted so came to ER. Received some NTG but  CP still persisted. Pain finally resolved after 4 hours.  Hospital Course: EKG revealed NSR, old inferior Qs, no acute ST/T changes. CXR was without acute cardiopulmonary abnormalities. Labs were significant for poc troponin 0.10 then 0.79. He was placed on IV heparin,  loaded with Brilinta, and admitted for further evaluation and treatment.   He had recurrent chest pain with peak troponin 2.93. Echocardiogram revealed EF 65%, no RWMAs, mod pericardial effusion unchanged from previous echo. Underwent cardiac cath 01/14/12 revealing 2/4 patent grafts, occluded SVG to OM1/OM2 with successful PTCA/DES x1 prox LCx. He tolerated the procedure well without complications. Recommendations were made for DAPT w/ ASA and Brilinta for one year. He was able to ambulate without chest pain or sob. He was seen and evaluated by Dr. Patty Sermons who felt he was stable for discharge home with plans for follow up as scheduled below.  Discharge Vitals: Blood pressure 111/65, pulse 79, temperature 98.3 F (36.8 C), temperature source Oral, resp. rate 24, height 5\' 10"  (1.778 m), weight 183 lb 3.2 oz (83.1 kg), SpO2 97.00%.  Labs:  Component Value Date   WBC 8.4 01/15/2012   HGB 13.6 01/15/2012   HCT 40.1 01/15/2012   MCV 83.2 01/15/2012   PLT 198 01/15/2012    Lab 01/15/12 0410 01/13/12 1610  NA 138 --  K 4.3 --  CL 104 --  CO2 26 --  BUN 11 --  CREATININE 1.13 --  CALCIUM 9.8 --  PROT -- 6.9  BILITOT -- 0.3  ALKPHOS -- 88  ALT -- 18  AST -- 18  GLUCOSE 106* --   Basename 01/14/12 0500 01/13/12 2254  CKTOTAL 212 101  CKMB 18.5* 6.7*  TROPONINI 2.93* 0.79*   Component Value Date   CHOL 151 01/14/2012   HDL 35* 01/14/2012   LDLCALC 94 01/14/2012   TRIG 109 01/14/2012    Discharge Medications   Medication List  As of 01/15/2012  8:50 AM   STOP taking these medications         azithromycin 250 MG tablet         TAKE these medications         aspirin 81 MG EC tablet   Take 1 tablet (81 mg total) by mouth daily.      atorvastatin 40 MG tablet   Commonly known as: LIPITOR   Take 40 mg by mouth daily.      bisacodyl 5 MG EC tablet   Commonly known as: DULCOLAX   Take 5-10 mg by mouth daily.      HYDROcodone-homatropine 5-1.5 MG/5ML syrup   Commonly known as:  HYCODAN   Take 5 mLs by mouth every 4 (four) hours as needed. For cough      metoprolol tartrate 25 MG tablet   Commonly known as: LOPRESSOR   Take 25 mg by mouth 3 (three) times daily.      nitroGLYCERIN 0.4 MG SL tablet   Commonly known as: NITROSTAT   Place 1 tablet (0.4 mg total) under the tongue every 5 (five) minutes as needed for chest pain (up to 3 doses).      Ticagrelor 90 MG Tabs tablet   Commonly known as: BRILINTA   Take 1 tablet (90 mg total) by mouth 2 (two) times daily.            Disposition   Discharge Orders    Future Appointments: Provider: Department: Dept Phone: Center:   01/17/2012 2:00 PM Beatrice Lecher, PA [facility] [phone]  [facility]     Future Orders Please Complete By Expires   Diet - low sodium heart healthy      Increase activity slowly      Discharge instructions      Comments:   **PLEASE REMEMBER TO BRING ALL OF YOUR MEDICATIONS TO EACH OF YOUR FOLLOW-UP OFFICE VISITS.  * KEEP GROIN SITE CLEAN AND DRY. Call the office for any signs of bleedings, pus, swelling, increased pain, or any other concerns. * NO HEAVY LIFTING (>10lbs) X 2 WEEKS. * NO SEXUAL ACTIVITY X 2 WEEKS. * NO DRIVING UNTIL Monday 1/61/09 * NO SOAKING BATHS, HOT TUBS, POOLS, ETC., X 7 DAYS.     Follow-up Information    Follow up with Tereso Newcomer, PA on 01/17/2012. (2:00)    Contact information:   [facility] [address] [place] [address] [phone]           Outstanding Labs/Studies:  None  Duration of Discharge Encounter: Greater than 30 minutes including physician and PA time.  Signed, Zanae Kuehnle PA-C 01/15/2012, 8:50 AM

## 2012-01-17 ENCOUNTER — Encounter: Payer: Self-pay | Admitting: Physician Assistant

## 2012-01-17 ENCOUNTER — Ambulatory Visit (INDEPENDENT_AMBULATORY_CARE_PROVIDER_SITE_OTHER): Payer: PRIVATE HEALTH INSURANCE | Admitting: Physician Assistant

## 2012-01-17 VITALS — BP 126/68 | HR 82 | Ht 70.0 in | Wt 182.0 lb

## 2012-01-17 DIAGNOSIS — I313 Pericardial effusion (noninflammatory): Secondary | ICD-10-CM

## 2012-01-17 DIAGNOSIS — E785 Hyperlipidemia, unspecified: Secondary | ICD-10-CM

## 2012-01-17 DIAGNOSIS — I319 Disease of pericardium, unspecified: Secondary | ICD-10-CM

## 2012-01-17 DIAGNOSIS — I2510 Atherosclerotic heart disease of native coronary artery without angina pectoris: Secondary | ICD-10-CM

## 2012-01-17 DIAGNOSIS — R05 Cough: Secondary | ICD-10-CM

## 2012-01-17 MED FILL — Dextrose Inj 5%: INTRAVENOUS | Qty: 50 | Status: AC

## 2012-01-17 NOTE — Progress Notes (Signed)
[address] [place], [place]  [address] Phone: [phone] Fax:  [phone]  Date:  01/17/2012   Name:  ERIQ HUFFORD   DOB:  12/01/1955   MRN:  [id number]  PCP:  Leanor Rubenstein, MD  Primary Cardiologist:  Dr. Verne Carrow  Primary Electrophysiologist:  None    History of Present Illness: JESON CAMACHO is a 56 y.o. male who returns for post hospital follow up.    He was admitted 5/6-5/14 with unstable angina.  Cardiac catheterization demonstrated three-vessel disease and he underwent CABG with Dr. Tyrone Sage 12/10/11: L-LAD, S-OM1 and OM2, S-PDA.  LV function was preserved.  Postoperative course was fairly uneventful.  He remained in sinus rhythm.    He was readmitted 5/29-5/30 with dyspnea.  BNP was mildly elevated.  Chest x-ray was normal.  He ruled out for myocardial infarction.  A chest CT was negative for pulmonary embolism.  Echocardiogram 12/29/11 demonstrated a small to moderate pericardial effusion with no evidence of tamponade.    He was then readmitted 6/13-6/15 with a NSTEMI.  He had been doing well and actually had seen Dr. Tyrone Sage in follow up that morning.  At home that afternoon he developed chest tightness which persisted.  His cardiac enzymes were abnormal with a peak troponin of 2.93.  Follow up echo 01/14/12: Mild LVH, EF 65%, moderate pericardial effusion circumferential to the heart with no evidence of Tamponade-similar to 12/29/11.  LHC 01/14/12: pLAD 40%, mLAD occluded, Dx 40%, pCFX 90%, OM1 small and occluded at the ostium, OM2 small with 99% stenosis, proximal and mid RCA 50%, mRCA occluded, S-OM1 and OM2 occluded, S-PDA okay, LIMA-LAD okay, EF 60%.  PCI: Promus DES to the pCFX.  Dual antiplatelet therapy recommended for one year.  He has been doing well.  He denies shortness of breath chest discomfort.  He denies orthopnea or PND.  He denies significant pedal edema.  He denies syncope.  His biggest complaint is that of a nonproductive cough.   This started shortly after coming home after his bypass surgery.  His PCP had him take a round of Zithromax.  This has not helped.  His chest x-rays have been clear.  He denies any worsening symptoms with lying supine.  He denies any chest pain with lying supine.  He denies any symptoms associated with meals.  He denies wheezing.  Wt Readings from Last 3 Encounters:  01/17/12 182 lb (82.555 kg)  01/15/12 183 lb 3.2 oz (83.1 kg)  01/15/12 183 lb 3.2 oz (83.1 kg)     Potassium  Date/Time Value Range Status  01/15/2012  4:10 AM 4.3  3.5 - 5.1 mEq/L Final     Creatinine, Ser  Date/Time Value Range Status  01/15/2012  4:10 AM 1.13  0.50 - 1.35 mg/dL Final     ALT  Date/Time Value Range Status  01/13/2012  4:10 PM 18  0 - 53 U/L Final     TSH  Date/Time Value Range Status  12/06/2011 11:55 PM 3.689  0.350 - 4.500 uIU/mL Final     Hemoglobin  Date/Time Value Range Status  01/15/2012  4:10 AM 13.6  13.0 - 17.0 g/dL Final    Past Medical History  Diagnosis Date  . Coronary artery disease     s/p 4V CABG 12/10/11 (LIMA to LAD, sequential SVG to OM1 & OM2, SVG to PDA);  NSTEMI 01/13/12:  LHC 01/14/12: pLAD 40%, mLAD occluded, Dx 40%, pCFX 90%, OM1 small and occluded at the  ostium, OM2 small with 99% stenosis, proximal and mid RCA 50%, mRCA occluded, S-OM1 and OM2 occluded, S-PDA okay, LIMA-LAD okay, EF 60%.  PCI: Promus DES to the pCFX    . HLD (hyperlipidemia)   . History of tobacco abuse     0.5 packs/day for 25 years, quit 2005  . Fibromyalgia   . Chronic neck pain   . Headache   . Pericardial effusion     echo 12/29/11 small to moderate pericardial effusion primarily posteriorly and anterolaterally; echo 01/14/12: Mild LVH, EF 65%, moderate pericardial effusion circumferential to the heart with no evidence of Tamponade-similar to 12/29/11   . Cough     Current Outpatient Prescriptions  Medication Sig Dispense Refill  . aspirin 81 MG EC tablet Take 1 tablet (81 mg total) by mouth  daily.      [name] atorvastatin (LIPITOR) 40 MG tablet Take 40 mg by mouth daily.      . bisacodyl (DULCOLAX) 5 MG EC tablet Take 5-10 mg by mouth daily.       [name] HYDROcodone-homatropine (HYCODAN) 5-1.5 MG/5ML syrup Take 5 mLs by mouth every 4 (four) hours as needed. For cough      . metoprolol tartrate (LOPRESSOR) 25 MG tablet Take 25 mg by mouth 3 (three) times daily.      . nitroGLYCERIN (NITROSTAT) 0.4 MG SL tablet Place 1 tablet (0.4 mg total) under the tongue every 5 (five) minutes as needed for chest pain (up to 3 doses).  25 tablet  3  . Ticagrelor (BRILINTA) 90 MG TABS tablet Take 1 tablet (90 mg total) by mouth 2 (two) times daily.  60 tablet  6    Allergies: No Known Allergies  History  Substance Use Topics  . Smoking status: Former Smoker -- 0.5 packs/day for 25 years    Types: Cigarettes    Quit date: 12/07/2003  . Smokeless tobacco: Never Used  . Alcohol Use: No     ROS:  Please see the history of present illness.    All other systems reviewed and negative.   PHYSICAL EXAM: VS:  BP 126/68  Pulse 82  Ht 5\' 10"  (1.778 m)  Wt 182 lb (82.555 kg)  BMI 26.11 kg/m2 Well nourished, well developed, in no acute distress HEENT: normal; oropharynx pink, TMs clear bilaterally Lymph: no cervical adenopathy Neck: no JVD Cardiac:  normal S1, S2; RRR; no murmur; no S3, no rub Lungs:  clear to auscultation bilaterally, no wheezing, rhonchi or rales Abd: soft, nontender, no hepatomegaly Ext: no edema; right groin without hematoma or bruit  Skin: warm and dry Neuro:  CNs 2-12 intact, no focal abnormalities noted  EKG:  Sinus rhythm, heart rate 82, normal axis, nonspecific ST-T wave changes, no change from prior tracing   ASSESSMENT AND PLAN:  1.  Coronary Artery Disease Overall, doing well post bypass and subsequent MI due to graft failure. We discussed the importance of dual antiplatelet therapy.  I have recommended he resume cardiac rehabilitation in 2 weeks. Follow up with Dr.  Verne Carrow in 4-6 weeks.  2.  Pericardial effusion Appears overall stable. No apparent symptoms. Do not think his cough is related. Follow up with Dr. Verne Carrow as noted above.  Consider f/u echo at that visit.  3.  Cough Etiology not clear. ? Related to ET tube during surgery - but symptoms started after returning home. Symptoms not related to position. No fevers.  CXR's have been clear. His cough in the exam room sounds to  be originating from the upper airways. ? Vocal cord dysfunction. I have suggested he try to cover for post nasal drip and acid reflux. He can start on Mucinex DM 600 mg BID for 1-2 weeks; Pepcid 10 mg BID for 1-2 weeks and Claritin 10 mg daily for 1-2 weeks. If symptoms change or persist, he should follow up with his PCP.  4.  Hyperlipidemia Arrange follow up Lipids and LFTs in 4-6 weeks.   Signed, Tereso Newcomer, PA-C  2:43 PM 01/17/2012

## 2012-01-17 NOTE — Patient Instructions (Addendum)
Your physician has recommended you make the following change in your medication: START Mucinex DM 600mg  twice a day for 1-2 WEEKS, START Claritin 10mg  daily for 1-2 WEEKS, START Pepcid 10mg  twice a day for 1-2 WEEKS  Your physician recommends that you return for a FASTING LIPID and LIVER Profile in 4-6 WEEKS--nothing to eat or drink after midnight  Your physician recommends that you schedule a follow-up appointment in: 4 WEEKS with Dr Lisette Grinder have been referred to Cardiac Rehab (start in 2 WEEKS)

## 2012-01-21 ENCOUNTER — Telehealth: Payer: Self-pay | Admitting: Cardiovascular Disease

## 2012-01-21 NOTE — Telephone Encounter (Signed)
Please return call to patient at [phone]  Patient c/o of weakness and knott near surgery site

## 2012-01-21 NOTE — Telephone Encounter (Signed)
Agree. cdm 

## 2012-01-21 NOTE — Telephone Encounter (Signed)
Spoke with pt who reports he has less energy since recent procedure. This has not changed since he saw Michael Newcomer, Michael Spears in office earlier this week.  No chest pain or shortness of breath. States he just gets very tired when doing small tasks.  Also complains of quarter sized knot in right groin. He is not aware if this was present when he was in office the other day.  Only painful when he pushes on it.  Has bruise in right groin--about one and one-half inches in diameter.  Bruise is not spreading. States incision on lower leg is healing well.   I told him knot and bruising were common after cath and should resolve but to call if it worsens.  I told him he should contact EMS if he develops chest pain, shortness of breath or any other symptoms similar to his MI symptoms. I told pt I would call him back if Dr. Clifton Spears had any further recommendations.

## 2012-01-24 ENCOUNTER — Telehealth: Payer: Self-pay | Admitting: Cardiovascular Disease

## 2012-01-24 NOTE — Telephone Encounter (Signed)
He should start Cardiac rehab which will be a good gauge for what he can and can't do and if his intolerance to exercise is due to blood pressure issues, Heart rate issues or rhythm issues. Thanks, chris

## 2012-01-24 NOTE — Telephone Encounter (Signed)
Spoke with pt and reviewed phone conversation from Friday with him.  He has had no changes since Friday but states he spoke with PT at Cardiac Rehab last week regarding on going tired feeling and was instructed to call office to discuss and possibly be seen prior to resuming rehab.  Pt is to resume cardiac rehab on February 01, 2012.  Pt states he is unable to walk more than a city block without feeling exhausted. No chest pain or shortness of breath. He states he is very nervous about this and his heart condition.  Will review with Dr. [name]

## 2012-01-24 NOTE — Telephone Encounter (Signed)
Spoke with pt and gave him information from Dr. McAlhany 

## 2012-01-24 NOTE — Telephone Encounter (Signed)
Pt has questions re heart cath/stent  Procedure he had done , pls call [phone]

## 2012-02-03 ENCOUNTER — Ambulatory Visit (HOSPITAL_COMMUNITY): Payer: PRIVATE HEALTH INSURANCE

## 2012-02-04 ENCOUNTER — Telehealth: Payer: Self-pay | Admitting: Cardiovascular Disease

## 2012-02-04 NOTE — Telephone Encounter (Signed)
Pt calling re having some headaches, could be from his current condition?

## 2012-02-04 NOTE — Telephone Encounter (Signed)
Spoke with pt who reports off and on headaches for last week or so at right temple. Some relief with Advil. He does not check blood pressure. Still with dry cough.  I asked him to contact primary care to have this evaluated. He is agreeable with this plan.

## 2012-02-07 ENCOUNTER — Encounter (HOSPITAL_COMMUNITY): Payer: PRIVATE HEALTH INSURANCE

## 2012-02-09 ENCOUNTER — Encounter (HOSPITAL_COMMUNITY): Payer: PRIVATE HEALTH INSURANCE

## 2012-02-10 ENCOUNTER — Other Ambulatory Visit: Payer: Self-pay | Admitting: Cardiothoracic Surgery

## 2012-02-10 ENCOUNTER — Encounter (HOSPITAL_COMMUNITY)
Admission: RE | Admit: 2012-02-10 | Discharge: 2012-02-10 | Disposition: A | Discharge status: Home / Self Care | Payer: PRIVATE HEALTH INSURANCE | Source: Ambulatory Visit | Attending: Cardiovascular Disease | Admitting: Cardiovascular Disease

## 2012-02-10 ENCOUNTER — Encounter (HOSPITAL_COMMUNITY): Payer: Self-pay

## 2012-02-10 DIAGNOSIS — I2584 Coronary atherosclerosis due to calcified coronary lesion: Secondary | ICD-10-CM | POA: Insufficient documentation

## 2012-02-10 DIAGNOSIS — I319 Disease of pericardium, unspecified: Secondary | ICD-10-CM | POA: Insufficient documentation

## 2012-02-10 DIAGNOSIS — Z5189 Encounter for other specified aftercare: Secondary | ICD-10-CM | POA: Insufficient documentation

## 2012-02-10 DIAGNOSIS — E785 Hyperlipidemia, unspecified: Secondary | ICD-10-CM | POA: Insufficient documentation

## 2012-02-10 DIAGNOSIS — I25810 Atherosclerosis of coronary artery bypass graft(s) without angina pectoris: Secondary | ICD-10-CM | POA: Insufficient documentation

## 2012-02-10 DIAGNOSIS — I214 Non-ST elevation (NSTEMI) myocardial infarction: Secondary | ICD-10-CM | POA: Insufficient documentation

## 2012-02-10 DIAGNOSIS — Z87891 Personal history of nicotine dependence: Secondary | ICD-10-CM | POA: Insufficient documentation

## 2012-02-10 DIAGNOSIS — Z9861 Coronary angioplasty status: Secondary | ICD-10-CM | POA: Insufficient documentation

## 2012-02-10 DIAGNOSIS — I2510 Atherosclerotic heart disease of native coronary artery without angina pectoris: Secondary | ICD-10-CM | POA: Insufficient documentation

## 2012-02-10 NOTE — Progress Notes (Signed)
Cardiac Rehab Medication Review by a Pharmacist  Does the patient  feel that his/her medications are working for him/her?  yes  Has the patient been experiencing any side effects to the medications prescribed?  no  Does the patient measure his/her own blood pressure or blood glucose at home?  No. Patient does occasionally (about once a week) check blood pressure at [facility].  Does the patient have any problems obtaining medications due to transportation or finances?   No, not at this time.  Patient does express concern about being able to afford the Brilinta after his discounted price expires.  Understanding of regimen: excellent Understanding of indications: excellent Potential of compliance: excellent    Pharmacist comments: Patient understands medications.  Cost may be a concern in the future.    Lillia Pauls, PharmD Clinical Pharmacist Pager: [phone] Phone: [phone] 02/10/2012 9:16 AM

## 2012-02-11 ENCOUNTER — Encounter (HOSPITAL_COMMUNITY): Payer: PRIVATE HEALTH INSURANCE

## 2012-02-14 ENCOUNTER — Other Ambulatory Visit: Payer: Self-pay | Admitting: Cardiovascular Disease

## 2012-02-14 ENCOUNTER — Encounter (HOSPITAL_COMMUNITY)
Admission: RE | Admit: 2012-02-14 | Discharge: 2012-02-14 | Disposition: A | Discharge status: Home / Self Care | Payer: PRIVATE HEALTH INSURANCE | Source: Ambulatory Visit | Attending: Cardiovascular Disease | Admitting: Cardiovascular Disease

## 2012-02-14 ENCOUNTER — Other Ambulatory Visit (INDEPENDENT_AMBULATORY_CARE_PROVIDER_SITE_OTHER): Payer: PRIVATE HEALTH INSURANCE

## 2012-02-14 DIAGNOSIS — I2510 Atherosclerotic heart disease of native coronary artery without angina pectoris: Secondary | ICD-10-CM

## 2012-02-14 LAB — LIPID PANEL
HDL: 48 mg/dL (ref >39.00)
Total CHOL/HDL Ratio: 4
VLDL: 35.6 mg/dL (ref 0.0–40.0)

## 2012-02-14 LAB — HEPATIC FUNCTION PANEL
ALT: 29 U/L (ref 0–53)
AST: 24 U/L (ref 0–37)
Albumin: 4.5 g/dL (ref 3.5–5.2)
Alkaline Phosphatase: 90 U/L (ref 39–117)
Bilirubin, Direct: 0.1 mg/dL (ref 0.0–0.3)
Total Bilirubin: 0.7 mg/dL (ref 0.3–1.2)
Total Protein: 7.3 g/dL (ref 6.0–8.3)

## 2012-02-14 NOTE — Progress Notes (Signed)
Pt started cardiac rehab today.  Pt tolerated light exercise with some  Difficulty Pt is deconditioned due to recent repeat hospitalizations and limited activity.. Monitor showed sr with no noted ectopy. Pt with complaint of chronic cough and plans to f/u with his primary MD.  Continue to monitor,

## 2012-02-15 ENCOUNTER — Telehealth: Payer: Self-pay | Admitting: *Deleted

## 2012-02-15 NOTE — Telephone Encounter (Signed)
s/w pt's wife and she states she is in the car and she will have pt cb in a few minutes when she gets home so I can go over the lab results with the pt. I gave my name and ph# [phone]

## 2012-02-15 NOTE — Telephone Encounter (Signed)
Patient returning nurse call can be reached at hm# [phone]

## 2012-02-15 NOTE — Telephone Encounter (Signed)
Message copied by Tarri Fuller on Tue Feb 15, 2012  4:04 PM ------      Message from: [place], [place] T      Created: Mon Feb 14, 2012  2:39 PM       Increase Lipitor to 80 mg QD      Check FLP and LFTs in 3 mos.      Tereso Newcomer, PA-C  2:39 PM 02/14/2012

## 2012-02-16 ENCOUNTER — Telehealth: Payer: Self-pay | Admitting: *Deleted

## 2012-02-16 ENCOUNTER — Encounter (HOSPITAL_COMMUNITY)
Admission: RE | Admit: 2012-02-16 | Discharge: 2012-02-16 | Disposition: A | Discharge status: Home / Self Care | Payer: PRIVATE HEALTH INSURANCE | Source: Ambulatory Visit | Attending: Cardiovascular Disease | Admitting: Cardiovascular Disease

## 2012-02-16 NOTE — Telephone Encounter (Signed)
Message copied by Tarri Fuller on Wed Feb 16, 2012 10:01 AM ------      Message from: [place], [place] T      Created: Mon Feb 14, 2012  2:39 PM       Increase Lipitor to 80 mg QD      Check FLP and LFTs in 3 mos.      Tereso Newcomer, PA-C  2:39 PM 02/14/2012

## 2012-02-16 NOTE — Telephone Encounter (Signed)
pt notified of lab results and to increase lipitor 80 mg daily, FLP/LFT 10/31. pt states to me that he has changed his diet, so he wants to know why are #'s still increasing? I said heredity is possible. I asked pt what did his diet now consist of? Any fried foods or fast foods? States no. Asked if he eats a lot of red meat or pork, he said he may have ground beef and pork loin like 1-2 times week. Pt states he looks for lean cuts though. Pt states he likes to make tacos with the ground beef and has some sour cream at times. I explained maybe try to limit these foods somewhat and to go ahead and increase lipitor 80 mg and repeat FLP/LFT 10/31

## 2012-02-18 ENCOUNTER — Encounter (HOSPITAL_COMMUNITY)
Admission: RE | Admit: 2012-02-18 | Discharge: 2012-02-18 | Disposition: A | Discharge status: Home / Self Care | Payer: PRIVATE HEALTH INSURANCE | Source: Ambulatory Visit | Attending: Cardiovascular Disease | Admitting: Cardiovascular Disease

## 2012-02-21 ENCOUNTER — Encounter (HOSPITAL_COMMUNITY): Payer: Self-pay | Admitting: Emergency Medicine

## 2012-02-21 ENCOUNTER — Observation Stay (HOSPITAL_COMMUNITY)
Admission: EM | Admit: 2012-02-21 | Discharge: 2012-02-23 | DRG: 303 | Disposition: A | Discharge status: Home / Self Care | Payer: PRIVATE HEALTH INSURANCE | Attending: Cardiology | Admitting: Cardiology

## 2012-02-21 ENCOUNTER — Encounter (HOSPITAL_COMMUNITY): Payer: PRIVATE HEALTH INSURANCE

## 2012-02-21 ENCOUNTER — Emergency Department (HOSPITAL_COMMUNITY): Payer: PRIVATE HEALTH INSURANCE

## 2012-02-21 ENCOUNTER — Telehealth: Payer: Self-pay | Admitting: Cardiovascular Disease

## 2012-02-21 DIAGNOSIS — I2510 Atherosclerotic heart disease of native coronary artery without angina pectoris: Principal | ICD-10-CM | POA: Diagnosis present

## 2012-02-21 DIAGNOSIS — Z7982 Long term (current) use of aspirin: Secondary | ICD-10-CM

## 2012-02-21 DIAGNOSIS — M542 Cervicalgia: Secondary | ICD-10-CM | POA: Diagnosis present

## 2012-02-21 DIAGNOSIS — Z87891 Personal history of nicotine dependence: Secondary | ICD-10-CM

## 2012-02-21 DIAGNOSIS — I200 Unstable angina: Secondary | ICD-10-CM | POA: Diagnosis present

## 2012-02-21 DIAGNOSIS — I313 Pericardial effusion (noninflammatory): Secondary | ICD-10-CM | POA: Diagnosis present

## 2012-02-21 DIAGNOSIS — R079 Chest pain, unspecified: Secondary | ICD-10-CM

## 2012-02-21 DIAGNOSIS — I214 Non-ST elevation (NSTEMI) myocardial infarction: Secondary | ICD-10-CM | POA: Diagnosis present

## 2012-02-21 DIAGNOSIS — I252 Old myocardial infarction: Secondary | ICD-10-CM

## 2012-02-21 DIAGNOSIS — Z951 Presence of aortocoronary bypass graft: Secondary | ICD-10-CM

## 2012-02-21 DIAGNOSIS — Z79899 Other long term (current) drug therapy: Secondary | ICD-10-CM

## 2012-02-21 DIAGNOSIS — G8929 Other chronic pain: Secondary | ICD-10-CM | POA: Diagnosis present

## 2012-02-21 DIAGNOSIS — R05 Cough: Secondary | ICD-10-CM

## 2012-02-21 DIAGNOSIS — I319 Disease of pericardium, unspecified: Secondary | ICD-10-CM | POA: Diagnosis present

## 2012-02-21 DIAGNOSIS — R059 Cough, unspecified: Secondary | ICD-10-CM | POA: Diagnosis present

## 2012-02-21 DIAGNOSIS — E785 Hyperlipidemia, unspecified: Secondary | ICD-10-CM | POA: Diagnosis present

## 2012-02-21 HISTORY — DX: Essential (primary) hypertension: I10

## 2012-02-21 HISTORY — DX: Pneumonia, unspecified organism: J18.9

## 2012-02-21 LAB — CREATININE, SERUM
Creatinine, Ser: 1.11 mg/dL (ref 0.50–1.35)
GFR calc Af Amer: 84 mL/min — ABNORMAL LOW (ref >90)

## 2012-02-21 LAB — CBC
HCT: 44.7 % (ref 39.0–52.0)
Hemoglobin: 15.9 g/dL (ref 13.0–17.0)
MCH: 28.4 pg (ref 26.0–34.0)
MCHC: 35.6 g/dL (ref 30.0–36.0)
MCV: 81.4 fL (ref 78.0–100.0)
MCV: 81.9 fL (ref 78.0–100.0)
Platelets: 182 10*3/uL (ref 150–400)
RDW: 13.3 % (ref 11.5–15.5)
RDW: 13.3 % (ref 11.5–15.5)
WBC: 7.6 10*3/uL (ref 4.0–10.5)

## 2012-02-21 LAB — BASIC METABOLIC PANEL
BUN: 14 mg/dL (ref 6–23)
Calcium: 10.3 mg/dL (ref 8.4–10.5)
Chloride: 102 mEq/L (ref 96–112)
Glucose, Bld: 114 mg/dL — ABNORMAL HIGH (ref 70–99)
Potassium: 4.8 mEq/L (ref 3.5–5.1)

## 2012-02-21 LAB — POCT I-STAT TROPONIN I: Troponin i, poc: 0 ng/mL (ref 0.00–0.08)

## 2012-02-21 LAB — CARDIAC PANEL(CRET KIN+CKTOT+MB+TROPI)
CK, MB: 2.8 ng/mL (ref 0.3–4.0)
Troponin I: <0.3 ng/mL (ref <0.30)

## 2012-02-21 MED ORDER — METOPROLOL TARTRATE 25 MG PO TABS
25.0000 mg | ORAL_TABLET | Freq: Three times a day (TID) | ORAL | Status: DC
  Administered 2012-02-21 – 2012-02-23 (×5): 25 mg via ORAL
  Filled 2012-02-21 (×7): qty 1

## 2012-02-21 MED ORDER — ZOLPIDEM TARTRATE 5 MG PO TABS: 5.0000 mg | ORAL_TABLET | Freq: Every evening | ORAL | Status: DC | PRN

## 2012-02-21 MED ORDER — ASPIRIN 325 MG PO TABS: 325.0000 mg | ORAL_TABLET | ORAL | Status: DC

## 2012-02-21 MED ORDER — TICAGRELOR 90 MG PO TABS
90.0000 mg | ORAL_TABLET | Freq: Two times a day (BID) | ORAL | Status: DC
  Administered 2012-02-21 – 2012-02-23 (×4): 90 mg via ORAL
  Filled 2012-02-21 (×5): qty 1

## 2012-02-21 MED ORDER — ACETAMINOPHEN 500 MG PO TABS
1000.0000 mg | ORAL_TABLET | ORAL | Status: DC | PRN
  Administered 2012-02-22 (×4): 1000 mg via ORAL
  Filled 2012-02-21 (×5): qty 2

## 2012-02-21 MED ORDER — SODIUM CHLORIDE 0.9 % IV SOLN: 250.0000 mL | INTRAVENOUS | Status: DC | PRN

## 2012-02-21 MED ORDER — ONDANSETRON HCL 4 MG/2ML IJ SOLN: 4.0000 mg | Freq: Four times a day (QID) | INTRAMUSCULAR | Status: DC | PRN

## 2012-02-21 MED ORDER — ENOXAPARIN SODIUM 40 MG/0.4ML ~~LOC~~ SOLN
40.0000 mg | SUBCUTANEOUS | Status: DC
  Administered 2012-02-21 – 2012-02-22 (×2): 40 mg via SUBCUTANEOUS
  Filled 2012-02-21 (×3): qty 0.4

## 2012-02-21 MED ORDER — ASPIRIN EC 81 MG PO TBEC
81.0000 mg | DELAYED_RELEASE_TABLET | Freq: Every day | ORAL | Status: DC
  Administered 2012-02-22 – 2012-02-23 (×2): 81 mg via ORAL
  Filled 2012-02-21 (×2): qty 1

## 2012-02-21 MED ORDER — NITROGLYCERIN 0.4 MG SL SUBL: 0.4000 mg | SUBLINGUAL_TABLET | SUBLINGUAL | Status: DC | PRN

## 2012-02-21 MED ORDER — FAMOTIDINE 10 MG PO TABS
10.0000 mg | ORAL_TABLET | Freq: Two times a day (BID) | ORAL | Status: DC
  Administered 2012-02-21 – 2012-02-23 (×4): 10 mg via ORAL
  Filled 2012-02-21 (×5): qty 1

## 2012-02-21 MED ORDER — SODIUM CHLORIDE 0.9 % IJ SOLN: 3.0000 mL | INTRAMUSCULAR | Status: DC | PRN

## 2012-02-21 MED ORDER — SODIUM CHLORIDE 0.9 % IJ SOLN
3.0000 mL | Freq: Two times a day (BID) | INTRAMUSCULAR | Status: DC
  Administered 2012-02-21 – 2012-02-22 (×3): 3 mL via INTRAVENOUS

## 2012-02-21 MED ORDER — GUAIFENESIN-DM 100-10 MG/5ML PO SYRP: 5.0000 mL | ORAL_SOLUTION | Freq: Four times a day (QID) | ORAL | Status: DC | PRN

## 2012-02-21 MED ORDER — ALPRAZOLAM 0.25 MG PO TABS: 0.2500 mg | ORAL_TABLET | Freq: Two times a day (BID) | ORAL | Status: DC | PRN

## 2012-02-21 MED ORDER — ATORVASTATIN CALCIUM 80 MG PO TABS
80.0000 mg | ORAL_TABLET | Freq: Every day | ORAL | Status: DC
  Administered 2012-02-22 – 2012-02-23 (×2): 80 mg via ORAL
  Filled 2012-02-21 (×3): qty 1

## 2012-02-21 NOTE — Progress Notes (Signed)
Michael Spears 56 y.o. male       Nutrition Screen                                                                    YES  NO Do you live in a nursing home?  X   Do you eat out more than 3 times/week?    X If yes, how many times per week do you eat out?  Do you have food allergies?   X If yes, what are you allergic to?  Have you gained or lost more than 10 lbs without trying?              X  If yes, how much weight have you lost and over what time period? 25 lbs gained or lost over 2.5 months  Do you want to lose weight?    X  If yes, what is a goal weight or amount of weight you would like to lose? 10 lb  Do you eat alone most of the time?   X   Do you eat less than 2 meals/day?  X If yes, how many meals do you eat?    Do you drink more than 3 alcohol drinks/day?  X If yes, how many drinks per day?   Are you having trouble with constipation? * X  If yes, what are you doing to help relieve constipation? Periodically have difficulty with constipation. Pt uses a stool softener  Do you have financial difficulties with buying food?*   X  "not working right now"  Are you experiencing regular nausea/ vomiting?*     X   Do you have a poor appetite? *                                        X   Do you have trouble chewing/swallowing? *   X    Pt with diagnoses of:  X CABG              X Dyslipidemia  / HDL< 40 / LDL>70 / High TG      X %  Body fat >goal / Body Mass Index >25 X HTN / BP >120/80 X MI X Pre-diabetes       Pt Risk Score   12       Diagnosis Risk Score  30       Total Risk Score   42                        X High Risk                Low Risk    HT: 68.25" Ht Readings from Last 1 Encounters:  02/10/12 5' 8.25" (1.734 m)    WT:   187.7 lb (85.3 kg) Wt Readings from Last 3 Encounters:  02/10/12 188 lb 0.8 oz (85.3 kg)  01/17/12 182 lb (82.555 kg)  01/15/12 183 lb 3.2 oz (83.1 kg)     IBW 70.7 121%IBW BMI 28.4 26.0%body fat  Meds reviewed Past Medical History  Diagnosis  Date  . Coronary artery  disease     s/p 4V CABG 12/10/11 (LIMA to LAD, sequential SVG to OM1 & OM2, SVG to PDA);  NSTEMI 01/13/12:  LHC 01/14/12: pLAD 40%, mLAD occluded, Dx 40%, pCFX 90%, OM1 small and occluded at the ostium, OM2 small with 99% stenosis, proximal and mid RCA 50%, mRCA occluded, S-OM1 and OM2 occluded, S-PDA okay, LIMA-LAD okay, EF 60%.  PCI: Promus DES to the pCFX    . HLD (hyperlipidemia)   . History of tobacco abuse     0.5 packs/day for 25 years, quit 2005  . Fibromyalgia   . Chronic neck pain   . Headache   . Pericardial effusion     echo 12/29/11 small to moderate pericardial effusion primarily posteriorly and anterolaterally; echo 01/14/12: Mild LVH, EF 65%, moderate pericardial effusion circumferential to the heart with no evidence of Tamponade-similar to 12/29/11   . Cough        Activity level: Pt is active  Wt goal: 164-176 lb ( 74.4-79.8 kg) Current tobacco use? No Food/Drug Interaction? No Labs:  Lipid Panel     Component Value Date/Time   CHOL 185 02/14/2012 0929   TRIG 178.0* 02/14/2012 0929   HDL 48.00 02/14/2012 0929   CHOLHDL 4 02/14/2012 0929   VLDL 35.6 02/14/2012 0929   LDLCALC 101* 02/14/2012 0929    Lab Results  Component Value Date   HGBA1C 5.9* 12/06/2011   01/15/2012 Glucose 106  LDL goal: < 70       MI and > 2:      HTN, > 57 yo male Estimated Daily Nutrition Needs for: ? wt loss  1500-2000 Kcal , Total Fat 40-55gm, Saturated Fat 9-13 gm, Trans Fat 1.4-2.0 gm,  Sodium less than 1500 mg

## 2012-02-21 NOTE — ED Notes (Signed)
Pt. Stated, i was having some physical rehab and I started having some chest discomfort and when I stopped it went away but i still knew it was there.  No pain just some discomfort when I'm doing something.

## 2012-02-21 NOTE — H&P (Signed)
History and Physical   Patient ID: Michael [facility]ars MRN: [id number], DOB/AGE: 1955-11-15   Admit date: 02/21/2012 Date of Consult: 02/21/2012   Primary Physician: Leanor Rubenstein, MD Primary Cardiologist: Earney Hamburg, MD  Pt. Profile: Michael [facility] is a 56yo Caucasian male with PMHx significant for CAD (NSTEMI s/p CABG 12/2011; NSTEMI from graft occlusion s/p DES to pLCX 01/2012 - see below), hyperlipidemia, history of tobacco abuse (12 + pack-year history, quit in 2005), pericardial effusion (mild-moderate, diagnosed in 12/2011, stable on repeat echo 01/2012) who presents to [facility] ED on 02/21/12 with chest pain.  HPI:   The patient was originally admitted in 12/2011 with unstable angina. Cardiac catheterization revealed three-vessel disease and he subsequently underwent CABG (as below). He did well post-operatively. He was readmitted later that month with dyspnea and mild pBNP elevation. CT-A was negative for PE. CXR was normal. Echo did reveal a mild-moderate pleural effusion without tamponade features. He was readmitted last month for NSTEMI. He had seen Dr. Tyrone Sage in follow-up earlier that morning, but then developed persistent chest tightness. On admission, cardiac enzymes returned elevated prompting cardiac catheterization which revealed the findings below. pLCx graft was found to be 99% occluded. DES was placed successfully. DAPT was recommended x 1 year.  The patient was recently seen by Tereso Newcomer, PA-C in the office in 01/17/12 and found to be fairly stable from an ischemic standpoint. He denied symptoms consistent with heart failure. He did note a chronic cough occurring since CABG. This was assessed and felt to not represent URI/bronchitis/PNA, but potentially PND or GERD-related. He was prescribed Mucinex and Pepcid, respectively.   He reports that he was in his USOH since follow-up in the office. He does still note a chronic nonproductive cough (no alleviation with Mucinex or  Pepcid). He endorses chronic, baseline fatigue. He has developed a R temporal headache intermittently. He had been completing cardiac rehab consistently without incident (elliptical, cycling and weight training). On Friday, he became lightheaded, and was found to be mildly hypotensive (99-100/50). There was no associated chest pain. He notes that later that day, he was walking his daughter to the bus stop, and felt substernal chest burning lasting for 1-2 minutes and relieved almost immediately with rest. He states it is "almost like heartburn," but is not associated with eating. He reports increased frequency of episodes over the weekend associated with mild-moderate exertion (loading laundry and taking out the trash). He has been able to complete these activities without incident previously. This feels very similar to his angina prior to undergoing CABG, but less severe. No radiation. Has not attempted NTG for relief as the pain subsides quickly with rest. He denies shortness of breath, lightheadedness, diaphoresis, n/v, LE edema, orthopnea, PND. He has been compliant with all of his medications. Given worsening of his symptoms, he called our office and was advised to present to the ED.   Upon ED arrival, initial trop-I WNL. EKG reveals no evidence of ischemia. CBC and BMET unremarkable. CXR without evidence of acute cardiopulmonary disease. He received  Currently comfortable, without chest pain.   Problem List: Past Medical History  Diagnosis Date  . Coronary artery disease     s/p 4V CABG 12/10/11 (LIMA to LAD, sequential SVG to OM1 & OM2, SVG to PDA);  NSTEMI 01/13/12:  LHC 01/14/12: pLAD 40%, mLAD occluded, Dx 40%, pCFX 90%, OM1 small and occluded at the ostium, OM2 small with 99% stenosis, proximal and mid RCA 50%, mRCA occluded, S-OM1 and OM2 occluded,  S-PDA okay, LIMA-LAD okay, EF 60%.  PCI: Promus DES to the pCFX    . HLD (hyperlipidemia)   . History of tobacco abuse     0.5 packs/day for 25 years,  quit 2005  . Fibromyalgia   . Chronic neck pain   . Headache   . Pericardial effusion     echo 12/29/11 small to moderate pericardial effusion primarily posteriorly and anterolaterally; echo 01/14/12: Mild LVH, EF 65%, moderate pericardial effusion circumferential to the heart with no evidence of Tamponade-similar to 12/29/11   . Cough     Past Surgical History  Procedure Date  . Coronary artery bypass graft 12/10/2011    Procedure: CORONARY ARTERY BYPASS GRAFTING (CABG);  Surgeon: Delight Ovens, MD;  Location: [facility];  Service: Open Heart Surgery;  Laterality: N/A;  Times four  using endoscopically harvested right greater saphenous and left internal mammary artery.     Allergies: No Known Allergies  Home Medications: Prior to Admission medications   Medication Sig Start Date End Date Taking? Authorizing Provider  acetaminophen (TYLENOL) 500 MG tablet Take 1,000 mg by mouth every 4 (four) hours as needed. For pain   Yes Historical Provider, MD  aspirin 81 MG EC tablet Take 1 tablet (81 mg total) by mouth daily. 01/15/12  Yes Jessica A Hope, PA-C  atorvastatin (LIPITOR) 80 MG tablet Take 80 mg by mouth daily.   Yes Historical Provider, MD  guaiFENesin-dextromethorphan (ROBITUSSIN DM) 100-10 MG/5ML syrup Take 5 mLs by mouth 4 (four) times daily as needed. Cough & congestion   Yes Historical Provider, MD  metoprolol tartrate (LOPRESSOR) 25 MG tablet Take 25 mg by mouth 3 (three) times daily.   Yes Historical Provider, MD  Ticagrelor (BRILINTA) 90 MG TABS tablet Take 1 tablet (90 mg total) by mouth 2 (two) times daily. 01/15/12  Yes Jessica A Hope, PA-C  nitroGLYCERIN (NITROSTAT) 0.4 MG SL tablet Place 1 tablet (0.4 mg total) under the tongue every 5 (five) minutes as needed for chest pain (up to 3 doses). 01/15/12 01/14/13  Guadlupe Spanish Hope, PA-C    Inpatient Medications:     . aspirin  325 mg Oral STAT    (Not in a hospital admission)  No family history on file.   History   Social  History  . Marital Status: Married    Spouse Name: N/A    Number of Children: N/A  . Years of Education: N/A   Occupational History  . Not on file.   Social History Main Topics  . Smoking status: Former Smoker -- 0.5 packs/day for 25 years    Types: Cigarettes    Quit date: 12/07/2003  . Smokeless tobacco: Never Used  . Alcohol Use: No  . Drug Use: No  . Sexually Active: Yes   Other Topics Concern  . Not on file   Social History Narrative  . No narrative on file     Review of Systems: General: negative for chills, fever, night sweats or weight changes.  Cardiovascular: positive for chest pain, negative for dyspnea on exertion, edema, orthopnea, palpitations, paroxysmal nocturnal dyspnea or shortness of breath Dermatological: negative for rash Respiratory: positive for cough or wheezing Urologic:  negative for hematuria Abdominal: negative for nausea, vomiting, diarrhea, bright red blood per rectum, melena, or hematemesis Neurologic:  negative for visual changes, syncope, or dizziness All other systems reviewed and are otherwise negative except as noted above.  Physical Exam: Blood pressure 119/80, pulse 75, temperature 98.3 F (36.8 C), temperature  source Oral, resp. rate 20, SpO2 98.00%.    General: Well developed, well nourished, in no acute distress. Head: Normocephalic, atraumatic, sclera non-icteric, no xanthomas, nares are without discharge.  Neck: Negative for carotid bruits. JVD not elevated. Lungs: Clear bilaterally to auscultation without wheezes, rales, or rhonchi. Breathing is unlabored. Heart: RRR with S1, physiologic split S2. No murmurs, rubs, or gallops appreciated. Abdomen: Soft, non-tender, non-distended with normoactive bowel sounds. No hepatomegaly. No rebound/guarding. No obvious abdominal masses. Msk:  Sternotomy scar noted. Strength and tone appears normal for age. Extremities: No clubbing, cyanosis or edema.  Distal pedal pulses are 2+ and equal  bilaterally. Neuro: Alert and oriented X 3. Moves all extremities spontaneously. Psych:  Responds to questions appropriately with a normal affect.  Labs: Recent Labs  Basename 02/21/12 1138   WBC 7.8   HGB 15.9   HCT 44.7   MCV 81.4   PLT 209   Lab 02/21/12 1138  NA 137  K 4.8  CL 102  CO2 23  BUN 14  CREATININE 1.02  CALCIUM 10.3  PROT --  BILITOT --  ALKPHOS --  ALT --  AST --  AMYLASE --  LIPASE --  GLUCOSE 114*   Radiology/Studies: Dg Chest 2 View  02/21/2012  *RADIOLOGY REPORT*  Clinical Data: Cough.  Hypertension.  CHEST - 2 VIEW  Comparison: Chest 01/13/2012.  CT chest 12/29/2011.  Findings: The patient is status post CABG.  Lungs are clear.  Heart size is normal.  No pneumothorax or pleural fluid.  IMPRESSION: No acute finding.  Original Report Authenticated By: Bernadene Bell. D'ALESSIO, M.D.    EKG: NSR, 94 bpm, Q waves III, aVF, no ST-T wave changes  ASSESSMENT:   1. Chest pain  2. CAD 3. Hyperlipidemia 4. Pericardial effusion 5. Cough  DISCUSSION/PLAN:   Patient's HPI is concerning for unstable angina. Chest pain is exertional and occurs with activities he had normally been able to complete without incident. As noted previously, his pain is reminiscent of his angina prior to undergoing CABG, but less severe. Post-CABG, his chest pain occurred rather quickly without warning (as opposed to progressive exertional chest pain over time) and was representative of NSTEMI secondary to occlusion of SVG-OM1/OM2. Given his cardiac history, evidence of early graft occlusion and similarity to pre-CABG angina, would favor admitting to telemetry and plan for ACS rule-out. If CEs return negative, likely discharge tomorrow. After discussing with Dr. [place], patient's symptoms may have been provoked by increased cardiac demand. He had just started cardiac rehab this past week. Continue outpatient medications and cycle cardiac biomarkers. No evidence of volume overload and no  pericardial friction rub noted on exam. No evidence of pulmonary edema on CXR. Will order repeat echo to assess pericardial effusion. Brilinta can cause cough, however he has complained of this shortly post-CABG when Brilinta had not yet been started. ? Secondary to pericardial effusion.     Signed, R. Hurman Horn, PA-C 02/21/2012, 3:30 PM   Patient seen and examined and history reviewed. Agree with above findings and plan. Michael [facility] has a recent complicated cardiac history. S/p CABG in May. Early graft occlusion of SVG to OMs in June with DES to proximal native LCx. This week started cardiac Rehab and admits that he has done more physical activity this week than any time since his bypass. He complains of chronic fatigue and a persistent dry cough ever since his CABG. Over the past 3 days he has experienced a relatively mild substernal chest burning lasting  1-2 minutes associated with moderate exertion. This is not as severe as his prior angina. Initial work up is negative including CXR, cardiac enzymes and Ecg. We will admit overnight for observation and cycle cardiac enzymes. It may be helpful to repeat an Echo since he had a moderate pericardial effusion post Op and again 01/14/12. 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 May need to consider switching Brilinta to Effient since this may be aggravating his cough. I doubt he will need repeat cath unless Cardiac enzymes are positive or chest pain worsens.  Michael [facility] 02/21/2012 5:19 PM

## 2012-02-21 NOTE — Telephone Encounter (Signed)
Patient called stated he has been having a burning sensation in chest since this past Friday 02/18/12.States happens with the least exertion.Stated he pulled trash can in from the road and had burning sensation.States has had the burning sensation 4 times since Friday.States has not took any NTG.States will rest after each episode and discomfort is gone within 2 min.Spoke to DOD Dr.Cooper he advised to go to [facility] ER.Rosann Auerbach was called and told.

## 2012-02-21 NOTE — ED Provider Notes (Signed)
History     CSN: [id number]  Arrival date & time 02/21/12  1039   First MD Initiated Contact with Patient 02/21/12 1150      Chief Complaint  Patient presents with  . Chest Pain    (Consider location/radiation/quality/duration/timing/severity/associated sxs/prior treatment) HPI Comments: Patient reports that he has been having intermittent substernal chest discomfort for the past 3 days.  Chest discomfort comes on with exertion and is relieved with rest.  Discomfort typically only last a few minutes.  Pain does not radiate.  He has not taken Nitroglycerin or anything else for the discomfort.  He denies any chest pain or discomfort at this time.  PMH significant for CABG on 12/10/11 and cardiac cath with stents in June of 2013 after a graft occlusion.  His cardiologist is Dr. [name].   He has an appointment scheduled with Dr. [name] in two days.   He recently started cardiac physical rehab.He currently takes Aspirin 81 mg daily.   He is also complaining of a dry chronic cough that has been present since his CABG in May.    Patient is a 56 y.o. male presenting with chest pain. The history is provided by the patient.  Chest Pain Primary symptoms include cough. Pertinent negatives for primary symptoms include no fever, no syncope, no shortness of breath, no wheezing, no palpitations, no abdominal pain, no nausea, no vomiting and no dizziness.  Pertinent negatives for associated symptoms include no diaphoresis, no lower extremity edema and no numbness.     Past Medical History  Diagnosis Date  . Coronary artery disease     s/p 4V CABG 12/10/11 (LIMA to LAD, sequential SVG to OM1 & OM2, SVG to PDA);  NSTEMI 01/13/12:  LHC 01/14/12: pLAD 40%, mLAD occluded, Dx 40%, pCFX 90%, OM1 small and occluded at the ostium, OM2 small with 99% stenosis, proximal and mid RCA 50%, mRCA occluded, S-OM1 and OM2 occluded, S-PDA okay, LIMA-LAD okay, EF 60%.  PCI: Promus DES to the pCFX    . HLD (hyperlipidemia)     . History of tobacco abuse     0.5 packs/day for 25 years, quit 2005  . Fibromyalgia   . Chronic neck pain   . Headache   . Pericardial effusion     echo 12/29/11 small to moderate pericardial effusion primarily posteriorly and anterolaterally; echo 01/14/12: Mild LVH, EF 65%, moderate pericardial effusion circumferential to the heart with no evidence of Tamponade-similar to 12/29/11   . Cough     Past Surgical History  Procedure Date  . Coronary artery bypass graft 12/10/2011    Procedure: CORONARY ARTERY BYPASS GRAFTING (CABG);  Surgeon: Delight Ovens, MD;  Location: [facility] OR;  Service: Open Heart Surgery;  Laterality: N/A;  Times four  using endoscopically harvested right greater saphenous and left internal mammary artery.    No family history on file.  History  Substance Use Topics  . Smoking status: Former Smoker -- 0.5 packs/day for 25 years    Types: Cigarettes    Quit date: 12/07/2003  . Smokeless tobacco: Never Used  . Alcohol Use: No      Review of Systems  Constitutional: Negative for fever, chills and diaphoresis.  HENT: Negative for neck pain and neck stiffness.   Respiratory: Positive for cough. Negative for shortness of breath and wheezing.   Cardiovascular: Positive for chest pain. Negative for palpitations, leg swelling and syncope.  Gastrointestinal: Negative for nausea, vomiting and abdominal pain.  Skin: Negative for color change and  rash.  Neurological: Negative for dizziness, syncope and numbness.    Allergies  Review of patient's allergies indicates no known allergies.  Home Medications   Current Outpatient Rx  Name Route Sig Dispense Refill  . ACETAMINOPHEN 500 MG PO TABS Oral Take 500 mg by mouth every 4 (four) hours as needed.    . ASPIRIN 81 MG PO TBEC Oral Take 1 tablet (81 mg total) by mouth daily.    . ATORVASTATIN CALCIUM 40 MG PO TABS  TAKE 1 TABLET EVERY DAY 30 tablet 0  . BISACODYL 5 MG PO TBEC Oral Take 5-10 mg by mouth daily.     [name]  FAMOTIDINE 10 MG PO TABS Oral Take 1 tablet (10 mg total) by mouth 2 (two) times daily.    . GUAIFENESIN ER 600 MG PO TB12 Oral Take 1 tablet (600 mg total) by mouth 2 (two) times daily.    . GUAIFENESIN-DM 100-10 MG/5ML PO SYRP Oral Take 5 mLs by mouth every 4 (four) hours.    [name] HYDROCODONE-HOMATROPINE 5-1.5 MG/5ML PO SYRP Oral Take 5 mLs by mouth every 4 (four) hours as needed. For cough    . LORATADINE 10 MG PO TABS Oral Take 1 tablet (10 mg total) by mouth daily.    [name] METOPROLOL TARTRATE 25 MG PO TABS Oral Take 25 mg by mouth 3 (three) times daily.    [name] NITROGLYCERIN 0.4 MG SL SUBL Sublingual Place 1 tablet (0.4 mg total) under the tongue every 5 (five) minutes as needed for chest pain (up to 3 doses). 25 tablet 3  . TICAGRELOR 90 MG PO TABS Oral Take 1 tablet (90 mg total) by mouth 2 (two) times daily. 60 tablet 6    BP 141/79  Pulse 86  Temp 98.3 F (36.8 C) (Oral)  Resp 20  SpO2 97%  Physical Exam  Nursing note and vitals reviewed. Constitutional: He appears well-developed and well-nourished. No distress.  HENT:  Head: Normocephalic and atraumatic.  Mouth/Throat: Oropharynx is clear and moist.  Neck: Normal range of motion. Neck supple.  Cardiovascular: Normal rate, regular rhythm, normal heart sounds and intact distal pulses.   Pulmonary/Chest: Effort normal and breath sounds normal. No respiratory distress. He has no wheezes. He has no rales. He exhibits no tenderness.  Abdominal: Soft. There is no tenderness.  Musculoskeletal: Normal range of motion.  Neurological: He is alert.  Skin: Skin is warm and dry. No rash noted. He is not diaphoretic. No erythema.  Psychiatric: He has a normal mood and affect.    ED Course  Procedures (including critical care time)  Labs Reviewed  BASIC METABOLIC PANEL - Abnormal; Notable for the following:    Glucose, Bld 114 (*)     GFR calc non Af Amer 80 (*)     All other components within normal limits  CBC   No results  found.   No diagnosis found.  3:09 PM Discussed with Trish with [facility].  She reports that she will come see the patient in the ED.  MDM  Concern for cardiac etiology of Chest Pain. Cardiology has been consulted and will see patient in the ED for likely admit. Pt does not meet criteria for CP protocol and a further evaluation is recommended. Pt has been re-evaluated prior to consult and VSS, NAD, heart RRR, pain 0/10, lungs CTAB. No acute abnormalities found on EKG and first round of cardiac enzymes negative. This case was discussed with Dr. Effie Shy.  Pascal Lux Holmen, PA-C 02/21/12 2143

## 2012-02-21 NOTE — Telephone Encounter (Signed)
New msg Pt has been having some chest discomfort on exertion that comes and goes over weekend. No sob, no chest pain today Please call

## 2012-02-22 ENCOUNTER — Encounter (HOSPITAL_COMMUNITY): Payer: Self-pay | Admitting: General Practice

## 2012-02-22 DIAGNOSIS — I319 Disease of pericardium, unspecified: Secondary | ICD-10-CM

## 2012-02-22 LAB — CARDIAC PANEL(CRET KIN+CKTOT+MB+TROPI)
CK, MB: 2.6 ng/mL (ref 0.3–4.0)
CK, MB: 2.6 ng/mL (ref 0.3–4.0)
Total CK: 120 U/L (ref 7–232)
Troponin I: <0.3 ng/mL (ref <0.30)

## 2012-02-22 MED ORDER — ISOSORBIDE MONONITRATE ER 30 MG PO TB24
30.0000 mg | ORAL_TABLET | Freq: Every day | ORAL | Status: DC
  Administered 2012-02-22 – 2012-02-23 (×2): 30 mg via ORAL
  Filled 2012-02-22 (×2): qty 1

## 2012-02-22 NOTE — Progress Notes (Signed)
    SUBJECTIVE: No chest pain overnight.   BP 148/69  Pulse 81  Temp 98.4 F (36.9 C) (Oral)  Resp 18  Ht 5\' 10"  (1.778 m)  Wt 184 lb 3.2 oz (83.553 kg)  BMI 26.43 kg/m2  SpO2 95% No intake or output data in the 24 hours ending 02/22/12 0827  PHYSICAL EXAM General: Well developed, well nourished, in no acute distress. Alert and oriented x 3.  Psych:  Good affect, responds appropriately Neck: No JVD. No masses noted.  Lungs: Clear bilaterally with no wheezes or rhonci noted.  Heart: RRR with no murmurs noted. Abdomen: Bowel sounds are present. Soft, non-tender.  Extremities: No lower extremity edema.   LABS: Basic Metabolic Panel:  Basename 02/21/12 1832 02/21/12 1138  NA -- 137  K -- 4.8  CL -- 102  CO2 -- 23  GLUCOSE -- 114*  BUN -- 14  CREATININE 1.11 1.02  CALCIUM -- 10.3  MG -- --  PHOS -- --   CBC:  Basename 02/21/12 1832 02/21/12 1138  WBC 7.6 7.8  NEUTROABS -- --  HGB 14.9 15.9  HCT 43.0 44.7  MCV 81.9 81.4  PLT 182 209   Cardiac Enzymes:  Basename 02/22/12 0525 02/22/12 0010 02/21/12 1833  CKTOTAL 120 125 129  CKMB 2.6 2.6 2.8  CKMBINDEX -- -- --  TROPONINI <0.30 <0.30 <0.30     Current Meds:    . aspirin  81 mg Oral Daily  . atorvastatin  80 mg Oral Daily  . enoxaparin (LOVENOX) injection  40 mg Subcutaneous Q24H  . famotidine  10 mg Oral BID  . metoprolol tartrate  25 mg Oral TID  . sodium chloride  3 mL Intravenous Q12H  . Ticagrelor  90 mg Oral BID  . DISCONTD: aspirin  325 mg Oral STAT     ASSESSMENT AND PLAN: Mr. Panico is a 56yo Caucasian male with PMHx significant for CAD (NSTEMI s/p CABG 12/2011; NSTEMI from graft occlusion s/p DES to pLCX 01/2012 - see below), hyperlipidemia, history of tobacco abuse (12 + pack-year history, quit in 2005), pericardial effusion (mild-moderate, diagnosed in 12/2011, stable on repeat echo 01/2012) who presents to [facility] ED on 02/21/12 with chest pain.   1. CAD: Admitted with chest pain.  Recent complex history as above with CABG may 2013 with early graft failure and DES placement to Circumflex June 2013. He is on good medical therapy. Cardiac enzymes negative. EKG without ischemia. Continue current therapy. Add Imdur. No cath indicated.   2. Pericardial effusion: Will repeat echo today to assess effusion  3. Dispo: Ambulate today. Check echo. D/c tomorrow if stable.   MCALHANY,CHRISTOPHER  7/23/20138:27 AM

## 2012-02-22 NOTE — Progress Notes (Signed)
*  PRELIMINARY RESULTS* Echocardiogram 2D Echocardiogram has been performed.  [facility] 02/22/2012, 4:28 PM

## 2012-02-23 ENCOUNTER — Encounter (HOSPITAL_COMMUNITY): Payer: PRIVATE HEALTH INSURANCE

## 2012-02-23 ENCOUNTER — Ambulatory Visit: Payer: PRIVATE HEALTH INSURANCE | Admitting: Cardiovascular Disease

## 2012-02-23 DIAGNOSIS — I214 Non-ST elevation (NSTEMI) myocardial infarction: Secondary | ICD-10-CM

## 2012-02-23 LAB — BASIC METABOLIC PANEL
CO2: 26 mEq/L (ref 19–32)
Calcium: 9.9 mg/dL (ref 8.4–10.5)
Creatinine, Ser: 1.19 mg/dL (ref 0.50–1.35)
GFR calc non Af Amer: 67 mL/min — ABNORMAL LOW (ref >90)
Glucose, Bld: 106 mg/dL — ABNORMAL HIGH (ref 70–99)

## 2012-02-23 MED ORDER — ISOSORBIDE MONONITRATE ER 30 MG PO TB24: 30.0000 mg | ORAL_TABLET | Freq: Every day | ORAL | Status: DC

## 2012-02-23 NOTE — Discharge Summary (Signed)
See full note this am. cdm 

## 2012-02-23 NOTE — Progress Notes (Signed)
SUBJECTIVE: No chest pain since Imdur started yesterday. NO SOB. Still has cough.  BP 104/72  Pulse 79  Temp 98.2 F (36.8 C) (Oral)  Resp 18  Ht 5\' 10"  (1.778 m)  Wt 185 lb (83.915 kg)  BMI 26.54 kg/m2  SpO2 96% No intake or output data in the 24 hours ending 02/23/12 0736  PHYSICAL EXAM General: Well developed, well nourished, in no acute distress. Alert and oriented x 3.  Psych:  Good affect, responds appropriately Neck: No JVD. No masses noted.  Lungs: Clear bilaterally with no wheezes or rhonci noted.  Heart: RRR with no murmurs noted. Abdomen: Bowel sounds are present. Soft, non-tender.  Extremities: No lower extremity edema.   LABS: Basic Metabolic Panel:  Basename 02/23/12 0424 02/21/12 1832 02/21/12 1138  NA 140 -- 137  K 4.8 -- 4.8  CL 104 -- 102  CO2 26 -- 23  GLUCOSE 106* -- 114*  BUN 17 -- 14  CREATININE 1.19 1.11 --  CALCIUM 9.9 -- 10.3  MG -- -- --  PHOS -- -- --   CBC:  Basename 02/21/12 1832 02/21/12 1138  WBC 7.6 7.8  NEUTROABS -- --  HGB 14.9 15.9  HCT 43.0 44.7  MCV 81.9 81.4  PLT 182 209   Cardiac Enzymes:  Basename 02/22/12 0525 02/22/12 0010 02/21/12 1833  CKTOTAL 120 125 129  CKMB 2.6 2.6 2.8  CKMBINDEX -- -- --  TROPONINI <0.30 <0.30 <0.30    Current Meds:    . aspirin  81 mg Oral Daily  . atorvastatin  80 mg Oral Daily  . enoxaparin (LOVENOX) injection  40 mg Subcutaneous Q24H  . famotidine  10 mg Oral BID  . isosorbide mononitrate  30 mg Oral Daily  . metoprolol tartrate  25 mg Oral TID  . sodium chloride  3 mL Intravenous Q12H  . Ticagrelor  90 mg Oral BID   Echo: 02/22/12:  - Left ventricle: The cavity size was normal. Wall thickness was increased in a pattern of moderate LVH. Systolic function was vigorous. The estimated ejection fraction was in the range of 65% to 70%. - Pericardium, extracardiac: A trivial pericardial effusion was identified.   ASSESSMENT AND PLAN: Michael Spears is a 56yo Caucasian male with  PMHx significant for CAD (NSTEMI s/p CABG 12/2011; NSTEMI from graft occlusion s/p DES to pLCX 01/2012 - see below), hyperlipidemia, history of tobacco abuse (12 + pack-year history, quit in 2005), pericardial effusion (mild-moderate, diagnosed in 12/2011, stable on repeat echo 01/2012) who presents to [facility] ED on 02/21/12 with chest pain.   1. CAD: Admitted with chest pain. Recent complex history as above with CABG may 2013 with early graft failure and DES placement to Circumflex June 2013. He is on good medical therapy. Cardiac enzymes negative. EKG without ischemia. Continue current therapy. Imdur added yesterday and chest pain resolved with ambulation.  No repeat cath indicated at this time.   2. Pericardial effusion: Trivial effusion by echo yesterday, much smaller in size. Non-significant in regards to tamponade but may represent inflammation/pericarditis/Dresslers syndrome. I have discussed anti-inflammatory agents if this persists but will not start at this time. This is improved.   3. Cough: Present since surgery. This was present before Brilinta was started and is improving per pt. CXR clear on 02/21/12.   4. Dispo: D/C home today. F/U with me in 2-3 weeks (or PA/NP if I dont have open slots). Will keep out of work until follow up in office. He  will resume cardiac rehab.    Michael Spears  7/24/20137:36 AM

## 2012-02-23 NOTE — Discharge Summary (Signed)
Discharge Summary   Patient ID: Michael Spears,  MRN: [id number], DOB/AGE: 57-Aug-1957 57 y.o.  Admit date: 02/21/2012 Discharge date: 02/23/2012  Primary Physician: Leanor Rubenstein, MD Primary Cardiologist: Earney Hamburg, MD  Discharge Diagnoses Principal Problem:  *Unstable angina  - Cardiac biomarkers returned WNL x 3 (trop-I WNL x 4)  - EKG with evidence of acute ischemia  - No indication for repeat cardiac catheterization at this time per Dr. [name]  - Improved on trial of Imdur, will continue on discharge Active Problems:  Hyperlipidemia  - LDL 101, HDL 48, TG 178, TC 185 on 78/29/56  - Continue high dose Lipitor  CAD (coronary artery disease)  - S/p CABG 12/2011, subsequent graft failure 01/2012 s/p DES-LCx  - As above, no acute ischemia identified this admission  - Continue ASA/Brilinta/BB/statin/NTG SL PRN  - Angina improved on Imdur  - Encouraged to utilize nitrates during acute episodes and even prior to exertion  - Will continue cardiac rehab  Pericardial effusion  - Repeat 2D echo this admission (below) revealed this has improved since last month   Cough  - Etiology unclear,  - Occurred post-CABG ruling out possible an ET tube source, prior to Brilinta ruling out side effect, not on ACEi, not much improvement on Mucinex, Claritin or Pepcid; Robitussin with some relief, improving overall  Allergies No Known Allergies  Diagnostic Studies/Procedures  CHEST X-RAY - 02/21/12  CHEST - 2 VIEW  Comparison: Chest 01/13/2012. CT chest 12/29/2011.  Findings: The patient is status post CABG. Lungs are clear. Heart  size is normal. No pneumothorax or pleural fluid.  IMPRESSION:  No acute finding.  2D ECHOCARDIOGRAM - 02/22/12  - Left ventricle: The cavity size was normal. Wall thickness was increased in a pattern of moderate LVH. Systolic function was vigorous. The estimated ejection fraction was in the range of 65% to 70%. - Pericardium, extracardiac: A trivial  pericardial effusion was identified.  History of Present Illness  Michael Spears is a 56yo Caucasian male with the above problem list who was admitted to [facility] on 02/21/12 for unstable angina.   He has a recent complex medical history including CABG in May 2013, subsequent early graft failure s/p DES placement to LCx in June 2013. He has been compliant on ASA/Brilinta/BB/high-dose statin since that time. He had recently begun cardiac rehab, and was doing well. The Friday prior to admission, he noticed that after his rehab session, he would experienced 1-2 minute substernal chest discomfort reminiscent of pre-CABG angina, relieved with rest. He did not employ the use of NTG SL. Over the weekend, these episodes continued on minimal exertion and increased in frequency thus prompting his presentation to the ED. There, EKG and initial trop-I returned without evidence of ischemia. CXR as above was without acute findings. Given his cardiac history and symptoms reminiscent of his prior angina, he was diagnosed with unstable angina and admitted for ACS rule-out. This was believed to have been secondary to supply-demand mismatch in that he had recently picked up his level of exertion on beginning cardiac rehab. He was continued on all outpatient medications.  Hospital Course   Cardiac biomarkers returned WNL as above. Dr. [name] assessed the patient and pursued a trial of Imdur. Repeat cardiac catheterization was deferred given the fact that his angina was likely secondary to increased demand rather than decreased supply (graft occlusion or CAD progression). The patient was encouraged to ambulate, and did so without further anginal episodes. He tolerated the Imdur well, without  complaints. Additionally, a repeat 2D echo was ordered to evaluate a previously documented mild-moderate pleural effusion. As above, this improved and was qualified as only trivial.  He was examined this morning and felt to be  stable for discharge. He will follow-up in the office in 2-3 weeks as noted below. See notes regarding his cough above. This has been improving slowly per patient, and will continued to be monitored. He will need to follow-up with his PCP for this as well. He will be discharged on all prior medications and a prescription for Imdur has been provided. He will continue cardiac rehab. This information, including supplemental angina material, has been clearly outlined in the discharge AVS. Of note, patient has been on FMLA for work since his CABG, and declined requiring a note for work.  Discharge Vitals:  Blood pressure 104/72, pulse 79, temperature 98.2 F (36.8 C), temperature source Oral, resp. rate 18, height 5\' 10"  (1.778 m), weight 83.915 kg (185 lb), SpO2 96.00%.  Weight change: 0.454 kg (1 lb)  Labs: Recent Labs  Basename 02/21/12 1832 02/21/12 1138   WBC 7.6 7.8   HGB 14.9 15.9   HCT 43.0 44.7   MCV 81.9 81.4   PLT 182 209   Lab 02/23/12 0424 02/21/12 1832 02/21/12 1138  NA 140 -- 137  K 4.8 -- 4.8  CL 104 -- 102  CO2 26 -- 23  BUN 17 -- 14  CREATININE 1.19 1.11 1.02  CALCIUM 9.9 -- 10.3  PROT -- -- --  BILITOT -- -- --  ALKPHOS -- -- --  ALT -- -- --  AST -- -- --  AMYLASE -- -- --  LIPASE -- -- --  GLUCOSE 106* -- 114*   Recent Labs  Basename 02/22/12 0525 02/22/12 0010 02/21/12 1833   CKTOTAL 120 125 129   CKMB 2.6 2.6 2.8   CKMBINDEX -- -- --   TROPONINI <0.30 <0.30 <0.30   Disposition:  Discharge Orders    Future Appointments: Provider: Department: Dept Phone: Center:   02/23/2012 10:15 AM [name], MD [facility] [phone] [facility]   02/23/2012 11:15 AM [facility] [phone] None   02/25/2012 11:15 AM [facility] [phone] None   02/28/2012 11:15 AM [facility] [phone] None   03/01/2012 11:15 AM [facility] [phone] None    03/03/2012 11:15 AM [facility] [phone] None   03/06/2012 11:15 AM [facility] [phone] None   03/08/2012 11:15 AM [facility] [phone] None   03/10/2012 8:30 AM Beatrice Lecher, PA [facility] [phone] [facility]   03/10/2012 11:15 AM [facility] [phone] None   03/13/2012 11:15 AM [facility] [phone] None   03/15/2012 11:15 AM [facility] [phone] None   03/17/2012 11:15 AM [facility] [phone] None   03/20/2012 11:15 AM [facility] [phone] None   03/22/2012 11:15 AM [facility] [phone] None   03/24/2012 11:15 AM [facility] [phone] None   03/27/2012 11:15 AM [facility] [phone] None   03/29/2012 11:15 AM [facility] [phone] None   03/31/2012 11:15 AM [facility] [phone] None   04/03/2012 11:15 AM [facility] [phone] None   04/05/2012 11:15 AM [facility] [phone] None   04/07/2012 11:15 AM [facility] [phone] None   04/10/2012 11:15 AM [facility] [phone] None   04/12/2012 11:15 AM [facility] [phone] None   04/14/2012 11:15 AM [facility] [phone] None   04/17/2012 11:15 AM [facility] [phone] None   04/19/2012 11:15 AM [facility] [phone] None   04/21/2012 11:15 AM [facility] [phone] None   04/24/2012 11:15 AM [facility] [phone] None   04/26/2012 11:15 AM [facility] [phone] None   04/28/2012 11:15 AM  [facility] [phone] None   05/01/2012 11:15 AM [facility] [phone] None   05/03/2012 11:15 AM [facility] [phone] None   05/05/2012 11:15 AM [facility] [phone] None   05/08/2012 11:15 AM [facility] [phone] None   05/10/2012 11:15 AM [facility] [phone] None   05/12/2012 11:15 AM [facility] [phone] None     Follow-up Information    Follow up with Tereso Newcomer, PA on 03/10/2012. (At 8:30 AM for follow-up after this hospitalization. )    Contact information:   [address] [place] [address] [phone]          Discharge Medications:  Medication List  As of 02/23/2012  9:02 AM   START taking these medications         isosorbide mononitrate 30 MG 24 hr tablet   Commonly known as: IMDUR   Take 1 tablet (30 mg total) by mouth daily.         CONTINUE taking these medications         acetaminophen 500 MG tablet   Commonly known as: TYLENOL      aspirin 81 MG EC tablet   Take 1 tablet (81 mg total) by mouth daily.      atorvastatin 80 MG tablet   Commonly known as: LIPITOR      guaiFENesin-dextromethorphan 100-10 MG/5ML syrup   Commonly known as: ROBITUSSIN DM      metoprolol tartrate 25 MG tablet   Commonly known as: LOPRESSOR      nitroGLYCERIN 0.4 MG SL tablet   Commonly known as: NITROSTAT   Place 1 tablet (0.4 mg total) under the tongue every 5 (five) minutes as needed for chest pain (up to 3 doses).      Ticagrelor 90 MG Tabs tablet   Commonly known as: BRILINTA   Take 1 tablet (90 mg total) by mouth 2 (two) times daily.         STOP taking these medications         PEPCID AC 10 MG tablet          Where to get your medications    These are the prescriptions that you need to pick up. We sent them to a specific pharmacy,  so you will need to go there to get them.   [id number] [place], [place] - [address]    [address] [place] [address]    Phone: [phone]        isosorbide mononitrate 30 MG 24 hr tablet           Outstanding Labs/Studies: None  Duration of Discharge Encounter: Greater than 30 minutes including  physician time.  Signed, R. Hurman Horn, PA-C 02/23/2012, 9:02 AM

## 2012-02-25 ENCOUNTER — Encounter (HOSPITAL_COMMUNITY): Payer: PRIVATE HEALTH INSURANCE

## 2012-02-25 NOTE — ED Provider Notes (Signed)
Medical screening examination/treatment/procedure(s) were performed by non-physician practitioner and as supervising physician I was immediately available for consultation/collaboration.  Flint Melter, MD 02/25/12 [phone]

## 2012-02-28 ENCOUNTER — Encounter (HOSPITAL_COMMUNITY): Payer: PRIVATE HEALTH INSURANCE

## 2012-02-28 ENCOUNTER — Telehealth (HOSPITAL_COMMUNITY): Payer: Self-pay | Admitting: *Deleted

## 2012-02-28 ENCOUNTER — Telehealth: Payer: Self-pay | Admitting: Cardiovascular Disease

## 2012-02-28 DIAGNOSIS — I2510 Atherosclerotic heart disease of native coronary artery without angina pectoris: Secondary | ICD-10-CM

## 2012-02-28 NOTE — Telephone Encounter (Signed)
Pt called regarding recent hospitalization for cp.  Pt was placed on Imdur 30mg  and is ok to return to rehab.  Pt with complaint of severe headache a known side effect.  Pt remarks that no matter what he does the headache is constant and feels he can not exercise or get out of bed.  Pt is planning to call the office to see what else can be done.  Pt takes extra strength tylenol with his Imdur and has tried taking it a night and nothing seems to help.  Pt advised to hold on exercise due to the severe headache and agree with pt that follow up with the office is appropriate.

## 2012-02-28 NOTE — Telephone Encounter (Signed)
Pt calling re imdur that he is taking

## 2012-02-29 MED ORDER — ATORVASTATIN CALCIUM 80 MG PO TABS: 80.0000 mg | ORAL_TABLET | Freq: Every day | ORAL | Status: DC

## 2012-02-29 NOTE — Telephone Encounter (Signed)
I spoke with the patient. [facility] is [facility] [address] to the 80 mg tablet. He was also placed on Imdur 30 mg once daily in the hospital last week. His chest discomfort is well controlled, but he has a terrible headache even with tylenol. I have advised that he hold his imdur a couple of days, then restart at 1/2 tablet daily with tylenol. I have asked that he call back Monday with how his headache is and how his chest discomfort is doing. He is agreeable.

## 2012-02-29 NOTE — Telephone Encounter (Signed)
F/u   please return call to patient regarding Imdur med dosage and new prescription, he can be reached at [phone]

## 2012-03-01 ENCOUNTER — Telehealth (HOSPITAL_COMMUNITY): Payer: Self-pay | Admitting: *Deleted

## 2012-03-01 ENCOUNTER — Encounter (HOSPITAL_COMMUNITY): Payer: PRIVATE HEALTH INSURANCE

## 2012-03-01 NOTE — Telephone Encounter (Signed)
Pt called and left message for cardiac rehab staff.  Pt will be absent today due to headache.  Pt did contact cardiologist office as advised and was told to take 1/2 in the morning and 1/2 in the evening to see if this would help with his headache. Pt wasn't for sure if this would work and wanted to give it another day.  Hopes to return on Friday.

## 2012-03-03 ENCOUNTER — Telehealth (HOSPITAL_COMMUNITY): Payer: Self-pay | Admitting: *Deleted

## 2012-03-03 ENCOUNTER — Encounter (HOSPITAL_COMMUNITY): Payer: PRIVATE HEALTH INSURANCE

## 2012-03-03 NOTE — Telephone Encounter (Signed)
Pt called out earlier today due to continued complaint of HA. Pt reports that he is adjusting to the medication and will f/u in the office on Monday.

## 2012-03-06 ENCOUNTER — Telehealth (HOSPITAL_COMMUNITY): Payer: Self-pay | Admitting: *Deleted

## 2012-03-06 ENCOUNTER — Encounter (HOSPITAL_COMMUNITY): Payer: PRIVATE HEALTH INSURANCE

## 2012-03-06 ENCOUNTER — Telehealth: Payer: Self-pay | Admitting: Cardiovascular Disease

## 2012-03-06 NOTE — Telephone Encounter (Signed)
Pt has headaches with his imdur and he want talk to someone about it because taking half didn't help either

## 2012-03-06 NOTE — Telephone Encounter (Signed)
Did he try taking 15 mg in the am and 15 mg in the pm? If not, that is what I would recommend. If he has tried that but it did not help with chest pain, would have him take 30 mg daily and use Tylenol for headache. The headache usually eases off after 1 week of therapy. Thanks, chris

## 2012-03-06 NOTE — Telephone Encounter (Signed)
Imdur: pt took whole and worked well for CP but gave him a horrendous HA. Tried splitting the tablet and reduced HA but had feelings of CP. Pt took whole tablet today because he didn't like CP. Told pt DR/RN back tomorrow and will call with and was told to call back by 3pm 03/07/12 if no word, pt agreed to plan.

## 2012-03-06 NOTE — Telephone Encounter (Signed)
Pt has tried split dose 12 hours apart but had cp in between. Prefers 30 mg at once because he has no CP but has been on full dose for one week and HA continues, told pt we will return call tomorrow with advise, pt agreed to plan.

## 2012-03-06 NOTE — Telephone Encounter (Signed)
Pt called out today due to trouble with his medication.   Pt is waiting for a return call from Dr. [name].  Pt reported that is headaches are getting better however his chest burning is back.

## 2012-03-07 NOTE — Telephone Encounter (Signed)
Spoke with Michael Spears who reports he has gone back to taking Imdur 30 mg daily. He is having no chest pain when taking it daily. Still has headaches that he describes as "coming on in waves."  Is taking tylenol with only slight relief of headaches.  Michael Spears has appt with Tereso Newcomer this Friday and feels he is able to tolerate headaches until time of office visit and will discuss alternatives at that time.

## 2012-03-08 ENCOUNTER — Encounter (HOSPITAL_COMMUNITY): Payer: PRIVATE HEALTH INSURANCE

## 2012-03-10 ENCOUNTER — Encounter: Payer: Self-pay | Admitting: Physician Assistant

## 2012-03-10 ENCOUNTER — Ambulatory Visit (INDEPENDENT_AMBULATORY_CARE_PROVIDER_SITE_OTHER): Payer: PRIVATE HEALTH INSURANCE | Admitting: Physician Assistant

## 2012-03-10 ENCOUNTER — Encounter (HOSPITAL_COMMUNITY): Payer: PRIVATE HEALTH INSURANCE

## 2012-03-10 VITALS — BP 123/79 | HR 76 | Ht 70.0 in | Wt 187.8 lb

## 2012-03-10 DIAGNOSIS — R0683 Snoring: Secondary | ICD-10-CM

## 2012-03-10 DIAGNOSIS — R5381 Other malaise: Secondary | ICD-10-CM

## 2012-03-10 DIAGNOSIS — R079 Chest pain, unspecified: Secondary | ICD-10-CM

## 2012-03-10 DIAGNOSIS — I25810 Atherosclerosis of coronary artery bypass graft(s) without angina pectoris: Secondary | ICD-10-CM

## 2012-03-10 DIAGNOSIS — R5383 Other fatigue: Secondary | ICD-10-CM

## 2012-03-10 DIAGNOSIS — R0989 Other specified symptoms and signs involving the circulatory and respiratory systems: Secondary | ICD-10-CM

## 2012-03-10 DIAGNOSIS — R05 Cough: Secondary | ICD-10-CM

## 2012-03-10 MED ORDER — AMLODIPINE BESYLATE 5 MG PO TABS: 5.0000 mg | ORAL_TABLET | Freq: Every day | ORAL | Status: DC

## 2012-03-10 NOTE — Patient Instructions (Addendum)
Your physician has recommended you make the following change in your medication: STOP IMDUR;  START NORVASC 5 MG 1 TABLET DAILY, PRESCRIPTION [facility] SENT TO [facility] [address] [facility]   Your physician has recommended that you have a SPLIT NIGHT sleep study DX SNORING, THIS IS TO BE DONE AT [facility]. This test records several body functions during sleep, including: brain activity, eye movement, oxygen and carbon dioxide blood levels, heart rate and rhythm, breathing rate and rhythm, the flow of air through your mouth and nose, snoring, body muscle movements, and chest and belly movement.    Your physician recommends that you schedule a follow-up appointment in: 2 WEEKS WITH DR. [name] AND IF NOT AVA LIABLE THEN WITH SCOTT WEAVER, PAC SAME DAY DR. [name] IS IN THE OFFICE PER SCOTT WEAVER, PAC

## 2012-03-10 NOTE — Progress Notes (Signed)
[address] [place], [place]  [address] Phone: [phone] Fax:  [phone]  Date:  03/10/2012   Name:  Michael Spears   DOB:  08/17/1955   MRN:  [id number]  PCP:  Leanor Rubenstein, MD  Primary Cardiologist:  Dr. Verne Carrow  Primary Electrophysiologist:  None    History of Present Illness: Michael Spears is a 56 y.o. male who returns for post hospital follow up.  He has a hx of CAD and underwent CABG with Dr. Tyrone Sage 12/10/11: L-LAD, S-OM1 and OM2, S-PDA. LV function was preserved. He was noted to have a moderate pericardial effusion by echo when readmitted with dyspnea in 5/13. Readmitted 6/13 with NSTEMI. Effusion stable by echo at that time and LHC demonstrated occluded SVG-OM1/OM2.  He underwent PCI with a Promus DES to the pCFX.   He was admitted 7/22-7/24 with chest pain. MI was ruled out. Cardiac catheterization was not felt to be warranted. Medical therapy was advanced and he was placed on isosorbide. Chest pain resolved.  Echo 02/22/12: Moderate LVH, EF 65-70%, trivial pericardial effusion.    Unfortunately, he has had a severe headache with Imdur. He has tried to split up to half a pill twice a day. He had breakthrough exertional chest pain with this. He actually has a lot of symptoms. His chest discomfort is completely absent with the isosorbide. He feels tired. He has episodic shortness of breath. He really denies exertional shortness of breath. He has occasional dizziness. He describes this as lightheadedness. He denies syncope or near-syncope or spinning sensation. It's very brief and he seems to describe orthostatic intolerance. Overall, his cough is improving. He did have a trial of Pepcid at one time the did not improve this. The cough is nonproductive. He denies fevers, chills, wheezing. He denies orthopnea, PND or edema.   Wt Readings from Last 3 Encounters:  03/10/12 187 lb 12.8 oz (85.186 kg)  02/23/12 185 lb (83.915 kg)  02/10/12 188 lb 0.8  oz (85.3 kg)     Past Medical History  Diagnosis Date  . Coronary artery disease     s/p 4V CABG 12/10/11 (LIMA to LAD, sequential SVG to OM1 & OM2, SVG to PDA);  NSTEMI 01/13/12:  LHC 01/14/12: pLAD 40%, mLAD occluded, Dx 40%, pCFX 90%, OM1 small and occluded at the ostium, OM2 small with 99% stenosis, proximal and mid RCA 50%, mRCA occluded, S-OM1 and OM2 occluded, S-PDA okay, LIMA-LAD okay, EF 60%.  PCI: Promus DES to the pCFX    . HLD (hyperlipidemia)   . History of tobacco abuse     0.5 packs/day for 25 years, quit 2005  . Fibromyalgia   . Chronic neck pain   . Headache   . Pericardial effusion     echo 12/29/11 small to moderate pericardial effusion primarily posteriorly and anterolaterally; echo 01/14/12: Mild LVH, EF 65%, moderate pericardial effusion circumferential to the heart with no evidence of Tamponade-similar to 12/29/11 ; trivial effusion by echo 7/13  . Cough   . Hypertension   . Pneumonia     HX OF pna  . Chest pain     Imdur added during admxn 7/13    Current Outpatient Prescriptions  Medication Sig Dispense Refill  . acetaminophen (TYLENOL) 500 MG tablet Take 1,000 mg by mouth every 4 (four) hours as needed. For pain      . aspirin 81 MG EC tablet Take 1 tablet (81 mg total) by mouth daily.      [name]  atorvastatin (LIPITOR) 80 MG tablet Take 1 tablet (80 mg total) by mouth daily.  30 tablet  6  . isosorbide mononitrate (IMDUR) 30 MG 24 hr tablet Take 1 tablet (30 mg total) by mouth daily.  30 tablet  3  . metoprolol tartrate (LOPRESSOR) 25 MG tablet Take 25 mg by mouth 3 (three) times daily.      . nitroGLYCERIN (NITROSTAT) 0.4 MG SL tablet Place 1 tablet (0.4 mg total) under the tongue every 5 (five) minutes as needed for chest pain (up to 3 doses).  25 tablet  3  . Ticagrelor (BRILINTA) 90 MG TABS tablet Take 1 tablet (90 mg total) by mouth 2 (two) times daily.  60 tablet  6    Allergies: No Known Allergies  History  Substance Use Topics  . Smoking status: Former  Smoker -- 0.5 packs/day for 25 years    Types: Cigarettes    Quit date: 12/07/2003  . Smokeless tobacco: Never Used  . Alcohol Use: No     ROS:  Please see the history of present illness.   He notes occasional pain in his right femoral arteriotomy site from his cardiac catheterization in June. He does note a history of snoring as well as witnessed apneic episodes.  All other systems reviewed and negative.   PHYSICAL EXAM: VS:  BP 123/79  Pulse 76  Ht 5\' 10"  (1.778 m)  Wt 187 lb 12.8 oz (85.186 kg)  BMI 26.95 kg/m2 Well nourished, well developed, in no acute distress HEENT: normal Neck: no JVD Cardiac:  normal S1, S2; RRR; no murmur Lungs:  clear to auscultation bilaterally, no wheezing, rhonchi or rales Abd: soft, nontender, no hepatomegaly Ext: no edema; right groin without hematoma or bruit  Skin: warm and dry Neuro:  CNs 2-12 intact, no focal abnormalities noted  EKG:  Sinus rhythm, heart rate 78, inferior Q waves, and no significant change since prior tracing      ASSESSMENT AND PLAN:  1. Chest Pain This was felt to be related to increased demand. He ruled out for ACS. His chest discomfort has resolved with the isosorbide. However, he cannot tolerate this due to unrelenting headaches. I long discussion with patient regarding his symptoms. We have decided to discontinue his isosorbide. I will start him on amlodipine 5 mg daily. He will be brought back in close followup in the next 2 weeks. He will remain out of work until that time. I have encouraged him to go back to cardiac rehabilitation next week.  2. Fatigue He has a lot of symptoms that are consistent with sleep apnea. I will set up a split-night sleep test.  3. CAD Advance antianginal therapy as noted above. Continue aspirin, Brilinta and statin.  4. Cough Etiology not clear. Workup has been unrevealing. He has tried a H2 receptor antagonist in the past without significant relief. His cough is almost resolved. No  further workup at this time.  5. Hyperlipidemia Continue Lipitor.  Luna Glasgow, PA-C  8:49 AM 03/10/2012

## 2012-03-13 ENCOUNTER — Encounter (HOSPITAL_COMMUNITY)
Admission: RE | Admit: 2012-03-13 | Discharge: 2012-03-13 | Disposition: A | Discharge status: Home / Self Care | Payer: PRIVATE HEALTH INSURANCE | Source: Ambulatory Visit | Attending: Cardiovascular Disease | Admitting: Cardiovascular Disease

## 2012-03-13 DIAGNOSIS — I214 Non-ST elevation (NSTEMI) myocardial infarction: Secondary | ICD-10-CM | POA: Insufficient documentation

## 2012-03-13 DIAGNOSIS — I2510 Atherosclerotic heart disease of native coronary artery without angina pectoris: Secondary | ICD-10-CM | POA: Insufficient documentation

## 2012-03-13 DIAGNOSIS — I319 Disease of pericardium, unspecified: Secondary | ICD-10-CM | POA: Insufficient documentation

## 2012-03-13 DIAGNOSIS — I2584 Coronary atherosclerosis due to calcified coronary lesion: Secondary | ICD-10-CM | POA: Insufficient documentation

## 2012-03-13 DIAGNOSIS — E785 Hyperlipidemia, unspecified: Secondary | ICD-10-CM | POA: Insufficient documentation

## 2012-03-13 DIAGNOSIS — Z9861 Coronary angioplasty status: Secondary | ICD-10-CM | POA: Insufficient documentation

## 2012-03-13 DIAGNOSIS — Z87891 Personal history of nicotine dependence: Secondary | ICD-10-CM | POA: Insufficient documentation

## 2012-03-13 DIAGNOSIS — Z5189 Encounter for other specified aftercare: Secondary | ICD-10-CM | POA: Insufficient documentation

## 2012-03-13 DIAGNOSIS — I25810 Atherosclerosis of coronary artery bypass graft(s) without angina pectoris: Secondary | ICD-10-CM | POA: Insufficient documentation

## 2012-03-13 NOTE — Progress Notes (Signed)
Pt returned to rehab after a long absence due to hospitalization and low tolerance of Imdur.  Pt was unable to attend due to severe headaches.  Pt denies any complaints and physically looks better.  Pt with new medication added Norvasc  5 mg daily.  During the first station pt with complaint of  fealing lightheaded while ambulating around the track.  Pt noted to walk at a fast pace and had already walked 12 laps.  Pt given gatorade to drink which his blood pressure improved and symptoms resolved.  Pt able to continue exercise with no further complaints.

## 2012-03-15 ENCOUNTER — Telehealth: Payer: Self-pay | Admitting: *Deleted

## 2012-03-15 ENCOUNTER — Other Ambulatory Visit: Payer: Self-pay

## 2012-03-15 ENCOUNTER — Ambulatory Visit (HOSPITAL_COMMUNITY): Payer: PRIVATE HEALTH INSURANCE

## 2012-03-15 ENCOUNTER — Other Ambulatory Visit: Payer: Self-pay | Admitting: *Deleted

## 2012-03-15 ENCOUNTER — Ambulatory Visit (HOSPITAL_COMMUNITY)
Admission: RE | Admit: 2012-03-15 | Discharge: 2012-03-15 | Disposition: A | Discharge status: Home / Self Care | Payer: PRIVATE HEALTH INSURANCE | Source: Ambulatory Visit | Attending: Family Medicine | Admitting: Family Medicine

## 2012-03-15 ENCOUNTER — Encounter (HOSPITAL_COMMUNITY)
Admission: RE | Admit: 2012-03-15 | Discharge: 2012-03-15 | Disposition: A | Discharge status: Home / Self Care | Payer: PRIVATE HEALTH INSURANCE | Source: Ambulatory Visit | Attending: Cardiovascular Disease | Admitting: Cardiovascular Disease

## 2012-03-15 DIAGNOSIS — I2510 Atherosclerotic heart disease of native coronary artery without angina pectoris: Secondary | ICD-10-CM

## 2012-03-15 DIAGNOSIS — R9431 Abnormal electrocardiogram [ECG] [EKG]: Secondary | ICD-10-CM | POA: Insufficient documentation

## 2012-03-15 MED ORDER — RANOLAZINE ER 500 MG PO TB12: 500.0000 mg | ORAL_TABLET | Freq: Two times a day (BID) | ORAL | Status: DC

## 2012-03-15 NOTE — Telephone Encounter (Signed)
EKG reviewed and unchanged. Can we start Ranexa 500 mg po BID. cdm

## 2012-03-15 NOTE — Telephone Encounter (Signed)
Received call from [facility] at Cardiac Rehab that pt complaining of burning in chest when at rehab today. Pain went away with rest.  Byrd Hesselbach will do EKG and scan in.

## 2012-03-15 NOTE — Progress Notes (Signed)
Patient complained of chest burning while walking the track.  Michael Spears rated the discomfort a 1-2 on a 1-10 scale. Exercise stopped. Blood pressure 122/74.  !2 lead ECG obtained and scanned and reviewed by Dr [name]. Dr [name] said his ECG tracing looks unchanged and the patient may go home. [name] plans to return to exercise on Friday and knows to go to the ED if he has a reoccurrence of chest pain. [name] left cardiac rehab without complaints. Will continue to monitor the patient throughout  the program.

## 2012-03-15 NOTE — Telephone Encounter (Signed)
Spoke with pt and he would like to try Ranexa. He is aware to take one tablet  twice daily (12 hours apart).  Ranexa 500 mg --28 tabs--Lot [id number]--exp 10/16 left at front desk for pt to pick up.  He has follow up appt with Tereso Newcomer, PA on March 29, 2012

## 2012-03-15 NOTE — Telephone Encounter (Signed)
Left message for pt to call back  °

## 2012-03-17 ENCOUNTER — Encounter (HOSPITAL_COMMUNITY)
Admission: RE | Admit: 2012-03-17 | Discharge: 2012-03-17 | Disposition: A | Discharge status: Home / Self Care | Payer: PRIVATE HEALTH INSURANCE | Source: Ambulatory Visit | Attending: Cardiovascular Disease | Admitting: Cardiovascular Disease

## 2012-03-20 ENCOUNTER — Encounter (HOSPITAL_COMMUNITY)
Admission: RE | Admit: 2012-03-20 | Discharge: 2012-03-20 | Disposition: A | Discharge status: Home / Self Care | Payer: PRIVATE HEALTH INSURANCE | Source: Ambulatory Visit | Attending: Cardiovascular Disease | Admitting: Cardiovascular Disease

## 2012-03-22 ENCOUNTER — Encounter (HOSPITAL_COMMUNITY)
Admission: RE | Admit: 2012-03-22 | Discharge: 2012-03-22 | Disposition: A | Discharge status: Home / Self Care | Payer: PRIVATE HEALTH INSURANCE | Source: Ambulatory Visit | Attending: Cardiovascular Disease | Admitting: Cardiovascular Disease

## 2012-03-22 NOTE — Progress Notes (Signed)
Reviewed home exercise with pt today.  Pt plans to walk at home for exercise.  Reviewed THR, pulse, RPE, sign and symptoms, NTG use, and when to call 911 or MD.  Pt voiced understanding. Jacorion Klem, MA, ACSM RCEP   

## 2012-03-24 ENCOUNTER — Encounter (HOSPITAL_COMMUNITY)
Admission: RE | Admit: 2012-03-24 | Discharge: 2012-03-24 | Disposition: A | Discharge status: Home / Self Care | Payer: PRIVATE HEALTH INSURANCE | Source: Ambulatory Visit | Attending: Cardiovascular Disease | Admitting: Cardiovascular Disease

## 2012-03-27 ENCOUNTER — Telehealth: Payer: Self-pay | Admitting: Cardiovascular Disease

## 2012-03-27 ENCOUNTER — Encounter (HOSPITAL_COMMUNITY): Payer: PRIVATE HEALTH INSURANCE

## 2012-03-27 NOTE — Telephone Encounter (Signed)
New Problem:    Patient called in wanting to know if difficulty swallowing and muscle aches are possible side effects of ranolazine (RANEXA) 500 MG 12 hr tablet.  Please call back.

## 2012-03-27 NOTE — Telephone Encounter (Signed)
Pt calls b/c since starting Ranexa he has had a "mild scratchy throat irritation"  This is more so when eating solid foods. Also with complaints of muscle aches. 1.  Pt not sure it is the Ranexa but it started around the same time. He denies any respiratory changes, denies fever, denies patchiness of the mouth.  He has not contacted his pcp  2.  Regarding the muscle aches: Pt is taking his lipitor in the morning.  He will start taking his lipitor at bedtime and see if this helps.  Pt would like Dr. [name] to know that he has had no more episodes of chest discomfort since starting the Ranexa. Pt already has appt with Lorin Picket on Wednesday 03/29/12  I will forward to Dr. [name] for review.

## 2012-03-27 NOTE — Telephone Encounter (Signed)
Thanks, chris 

## 2012-03-29 ENCOUNTER — Ambulatory Visit (INDEPENDENT_AMBULATORY_CARE_PROVIDER_SITE_OTHER): Payer: PRIVATE HEALTH INSURANCE | Admitting: Physician Assistant

## 2012-03-29 ENCOUNTER — Encounter: Payer: Self-pay | Admitting: Physician Assistant

## 2012-03-29 ENCOUNTER — Encounter (HOSPITAL_COMMUNITY)
Admission: RE | Admit: 2012-03-29 | Discharge: 2012-03-29 | Disposition: A | Discharge status: Home / Self Care | Payer: PRIVATE HEALTH INSURANCE | Source: Ambulatory Visit | Attending: Cardiovascular Disease | Admitting: Cardiovascular Disease

## 2012-03-29 ENCOUNTER — Encounter: Payer: Self-pay | Admitting: *Deleted

## 2012-03-29 VITALS — BP 126/78 | HR 80 | Ht 70.0 in | Wt 187.4 lb

## 2012-03-29 DIAGNOSIS — I25810 Atherosclerosis of coronary artery bypass graft(s) without angina pectoris: Secondary | ICD-10-CM

## 2012-03-29 DIAGNOSIS — E785 Hyperlipidemia, unspecified: Secondary | ICD-10-CM

## 2012-03-29 DIAGNOSIS — R0982 Postnasal drip: Secondary | ICD-10-CM

## 2012-03-29 DIAGNOSIS — R079 Chest pain, unspecified: Secondary | ICD-10-CM

## 2012-03-29 DIAGNOSIS — R5383 Other fatigue: Secondary | ICD-10-CM

## 2012-03-29 DIAGNOSIS — R5381 Other malaise: Secondary | ICD-10-CM

## 2012-03-29 MED ORDER — RANOLAZINE ER 500 MG PO TB12: 500.0000 mg | ORAL_TABLET | Freq: Two times a day (BID) | ORAL | Status: DC

## 2012-03-29 MED ORDER — AMLODIPINE BESYLATE 5 MG PO TABS: 2.5000 mg | ORAL_TABLET | Freq: Every day | ORAL | Status: DC

## 2012-03-29 NOTE — Progress Notes (Signed)
[address] [place], [place]  [address] Phone: [phone] Fax:  [phone]  Date:  03/29/2012   Name:  Michael Spears   DOB:  04-19-1956   MRN:  [id number]  PCP:  Leanor Rubenstein, MD  Primary Cardiologist:  Dr. Verne Carrow  Primary Electrophysiologist:  None    History of Present Illness: Michael Spears is a 57 y.o. male who returns for follow up.  He has a hx of CAD, s/p CABG with Dr. Tyrone Sage 12/10/11: L-LAD, S-OM1 and OM2, S-PDA. He had a moderate pericardial effusion by echo when readmitted with dyspnea in 5/13. Readmitted again 6/13 with NSTEMI. Effusion stable by echo at that time and LHC demonstrated occluded SVG-OM1/OM2.  He underwent PCI with a Promus DES to the pCFX.   Admitted again 01/2012 with chest pain. Medical therapy was advanced and he was placed on isosorbide. Chest pain resolved.    Echo 02/22/12: Moderate LVH, EF 65-70%, trivial pericardial effusion.    Patient was seen in followup 03/10/12 and continued to have difficulty with intolerance to isosorbide. This was discontinued and he was placed on amlodipine. He called in with continued chest pain and cardiac rehabilitation. He was then started on Ranexa. He is doing much better with the Ranexa. He still has a little bit of chest pressure when he starts to exercise, but this quickly resolves. He has slight dyspnea with exertion. He continues to feel dizzy especially with standing. He denies syncope. He denies orthopnea, PND or edema.  Wt Readings from Last 3 Encounters:  03/10/12 187 lb 12.8 oz (85.186 kg)  02/23/12 185 lb (83.915 kg)  02/10/12 188 lb 0.8 oz (85.3 kg)     Past Medical History  Diagnosis Date  . Coronary artery disease     s/p 4V CABG 12/10/11 (LIMA to LAD, sequential SVG to OM1 & OM2, SVG to PDA);  NSTEMI 01/13/12:  LHC 01/14/12: pLAD 40%, mLAD occluded, Dx 40%, pCFX 90%, OM1 small and occluded at the ostium, OM2 small with 99% stenosis, proximal and mid RCA 50%, mRCA  occluded, S-OM1 and OM2 occluded, S-PDA okay, LIMA-LAD okay, EF 60%.  PCI: Promus DES to the pCFX    . HLD (hyperlipidemia)   . History of tobacco abuse     0.5 packs/day for 25 years, quit 2005  . Fibromyalgia   . Chronic neck pain   . Headache   . Pericardial effusion     echo 12/29/11 small to moderate pericardial effusion primarily posteriorly and anterolaterally; echo 01/14/12: Mild LVH, EF 65%, moderate pericardial effusion circumferential to the heart with no evidence of Tamponade-similar to 12/29/11 ; trivial effusion by echo 7/13  . Cough   . Hypertension   . Pneumonia     HX OF pna  . Chest pain     Imdur added during admxn 7/13    Current Outpatient Prescriptions  Medication Sig Dispense Refill  . acetaminophen (TYLENOL) 500 MG tablet Take 1,000 mg by mouth every 4 (four) hours as needed. For pain      . amLODipine (NORVASC) 5 MG tablet Take 1 tablet (5 mg total) by mouth daily.  30 tablet  11  . aspirin 81 MG EC tablet Take 1 tablet (81 mg total) by mouth daily.      [name] atorvastatin (LIPITOR) 80 MG tablet Take 1 tablet (80 mg total) by mouth daily.  30 tablet  6  . loratadine (CLARITIN) 10 MG tablet Take 10 mg by mouth daily.      [name]  metoprolol tartrate (LOPRESSOR) 25 MG tablet Take 25 mg by mouth 3 (three) times daily.      . nitroGLYCERIN (NITROSTAT) 0.4 MG SL tablet Place 1 tablet (0.4 mg total) under the tongue every 5 (five) minutes as needed for chest pain (up to 3 doses).  25 tablet  3  . ranolazine (RANEXA) 500 MG 12 hr tablet Take 1 tablet (500 mg total) by mouth 2 (two) times daily.  60 tablet  0  . Ticagrelor (BRILINTA) 90 MG TABS tablet Take 1 tablet (90 mg total) by mouth 2 (two) times daily.  60 tablet  6    Allergies: No Known Allergies  History  Substance Use Topics  . Smoking status: Former Smoker -- 0.5 packs/day for 25 years    Types: Cigarettes    Quit date: 12/07/2003  . Smokeless tobacco: Never Used  . Alcohol Use: No     ROS:  Please see the  history of present illness.   He notes some anxiety about resuming normal activities.  He has also noted some post nasal drip since starting the Ranexa. He denies fevers. His cough is resolved. All other systems reviewed and negative.   PHYSICAL EXAM: VS:  BP 126/78  Pulse 80  Ht 5\' 10"  (1.778 m)  Wt 187 lb 6.4 oz (85.004 kg)  BMI 26.89 kg/m2 Repeat blood pressure by me: 110/80  Well nourished, well developed, in no acute distress HEENT: normal Lymph: No cervical adenopathy Neck: no JVD Cardiac:  normal S1, S2; RRR; no murmur Lungs:  clear to auscultation bilaterally, no wheezing, rhonchi or rales Abd: soft, nontender, no hepatomegaly Ext: no edema   Skin: warm and dry Neuro:  CNs 2-12 intact, no focal abnormalities noted  EKG:  Sinus rhythm, heart rate 80, normal axis, no acute changes, QTc 415 ms    ASSESSMENT AND PLAN:  1. Chest Pain:  Improved. Continue Ranexa. Continue with cardiac rehabilitation.  2. Fatigue:  Split-night sleep test is pending.  3. CAD:  Angina improved. Continue current therapy.  4. Cough:  Resolved.  5. Hyperlipidemia:  Continue Lipitor.  6. Dizziness: I suspect this is related to hypotension. He does note episodes of blood pressures ranging in the 80s after exercise at cardiac rehabilitation. I have asked him to decrease his Norvasc to half a pill daily. If his dizziness persists, he can discontinue this. We may need to increase his Ranexa to 1000 mg twice a day if his Norvasc is discontinued.  7. Anxiety: I have encouraged him to continue to increase his activity. I believe he will feel much better once he resumes a normal lifestyle.  8. Postnasal Drip: Reassurance. I do not believe this is related to his Ranexa. He may use over-the-counter antihistamines as needed.  Signed, Tereso Newcomer, PA-C  9:08 AM 03/29/2012

## 2012-03-29 NOTE — Patient Instructions (Addendum)
Your physician has recommended you make the following change in your medication:  DECREASE  Norvasc to (2.5 mg) daily, one half tablet.  If dizziness persists STOP Norvasc and call us at [phone]. If chest pain continues discontinue Norvasc but call us at [phone] to let our office know CONTINUE  Ranexa (500 mg) twice a day       Your physician recommends that you schedule a follow-up appointment in: 2 months with Dr. Talbert Nan, DX: [phone]

## 2012-03-29 NOTE — Progress Notes (Signed)
Pt seen in the office this morning for follow up.  Pt with new medication Norvasc decrease to 2.5 mg daily due to complaint of dizziness.  Pt plans to return to work on next week part time.  Pt given list of exercise classes offered to share with his employer about his work schedule.

## 2012-03-29 NOTE — Progress Notes (Signed)
Michael Spears 56 y.o. male Nutrition Note Spoke with pt.  Nutrition Plan, Nutrition Survey, and cholesterol goals reviewed with pt. Pt is following Step 2 of the Therapeutic Lifestyle Changes diet. Pt reports having made significant changes to his diet since his heart procedure. Pt states he was eating "whatever I wanted," which included frequent trips to [facility]. Pt now cooking at home and using soy crumbles to make burritos at home. Pt reports he has made drastic changes to his diet because he was told "I had to because of my cholesterol." Pt is taking 80 mg Lipitor. Pt wants to lose wt. Per pt, his wt is down 25 # over 2.5 months. Pt c/o of wt gain of 8# since his initial hospitalization. Weight loss tips discussed. Per pt's A1c, pt is pre-diabetic. Pre-diabetes discussed and pt encouraged to follow--up with his PCP annually re: A1c test. Pt expressed understanding of information reviewed.   Nutrition Diagnosis   Food-and nutrition-related knowledge deficit related to lack of exposure to information as related to diagnosis of: ? CVD ? Pre-DM (A1c 5.9)    Overweight related to excessive energy intake as evidenced by a BMI of 28.4  Nutrition RX/ Estimated Daily Nutrition Needs for: wt loss  1500-2000 Kcal, 40-55 gm fat, 9-13 gm sat fat, 1.4-2.0 gm trans-fat, <1500 mg sodium ,   Nutrition Intervention   Pt's individual nutrition plan including cholesterol goals reviewed with pt.   Benefits of adopting Therapeutic Lifestyle Changes discussed when Medficts reviewed.   Pt to attend the Portion Distortion class   Pt to attend the  ? Nutrition I class                     ? Nutrition II class   Pt given handouts for: ? wt loss ? pre-DM    Continue client-centered nutrition education by RD, as part of interdisciplinary care. Goal(s)   Pt to identify food quantities necessary to achieve: ? wt loss to a goal wt of 164-176 lb (74.4-79.8 kg) at graduation from cardiac rehab.    Pt to describe the benefit  of including fruits, vegetables, whole grains, and low-fat dairy products in a heart healthy meal plan. Monitor and Evaluate progress toward nutrition goal with team.

## 2012-03-31 ENCOUNTER — Encounter (HOSPITAL_COMMUNITY): Payer: PRIVATE HEALTH INSURANCE

## 2012-04-03 ENCOUNTER — Encounter (HOSPITAL_COMMUNITY): Payer: PRIVATE HEALTH INSURANCE | Attending: Cardiovascular Disease

## 2012-04-03 DIAGNOSIS — I2584 Coronary atherosclerosis due to calcified coronary lesion: Secondary | ICD-10-CM | POA: Insufficient documentation

## 2012-04-03 DIAGNOSIS — I25810 Atherosclerosis of coronary artery bypass graft(s) without angina pectoris: Secondary | ICD-10-CM | POA: Insufficient documentation

## 2012-04-03 DIAGNOSIS — I214 Non-ST elevation (NSTEMI) myocardial infarction: Secondary | ICD-10-CM | POA: Insufficient documentation

## 2012-04-03 DIAGNOSIS — I319 Disease of pericardium, unspecified: Secondary | ICD-10-CM | POA: Insufficient documentation

## 2012-04-03 DIAGNOSIS — Z9861 Coronary angioplasty status: Secondary | ICD-10-CM | POA: Insufficient documentation

## 2012-04-03 DIAGNOSIS — I2510 Atherosclerotic heart disease of native coronary artery without angina pectoris: Secondary | ICD-10-CM | POA: Insufficient documentation

## 2012-04-03 DIAGNOSIS — E785 Hyperlipidemia, unspecified: Secondary | ICD-10-CM | POA: Insufficient documentation

## 2012-04-03 DIAGNOSIS — Z87891 Personal history of nicotine dependence: Secondary | ICD-10-CM | POA: Insufficient documentation

## 2012-04-03 DIAGNOSIS — Z5189 Encounter for other specified aftercare: Secondary | ICD-10-CM | POA: Insufficient documentation

## 2012-04-04 ENCOUNTER — Ambulatory Visit: Payer: PRIVATE HEALTH INSURANCE | Admitting: Physician Assistant

## 2012-04-05 ENCOUNTER — Other Ambulatory Visit: Payer: Self-pay | Admitting: *Deleted

## 2012-04-05 ENCOUNTER — Encounter (HOSPITAL_COMMUNITY): Payer: PRIVATE HEALTH INSURANCE

## 2012-04-05 DIAGNOSIS — R5383 Other fatigue: Secondary | ICD-10-CM

## 2012-04-07 ENCOUNTER — Encounter (HOSPITAL_BASED_OUTPATIENT_CLINIC_OR_DEPARTMENT_OTHER): Payer: PRIVATE HEALTH INSURANCE

## 2012-04-07 ENCOUNTER — Encounter (HOSPITAL_COMMUNITY): Payer: PRIVATE HEALTH INSURANCE

## 2012-04-07 ENCOUNTER — Telehealth (HOSPITAL_COMMUNITY): Payer: Self-pay | Admitting: *Deleted

## 2012-04-07 NOTE — Telephone Encounter (Signed)
Pt called on 04/06/12. Message left regarding work schedule and the ability to continue in the exercise program.

## 2012-04-10 ENCOUNTER — Encounter (HOSPITAL_COMMUNITY): Payer: PRIVATE HEALTH INSURANCE

## 2012-04-12 ENCOUNTER — Encounter (HOSPITAL_COMMUNITY): Payer: PRIVATE HEALTH INSURANCE

## 2012-04-14 ENCOUNTER — Encounter (HOSPITAL_COMMUNITY): Payer: PRIVATE HEALTH INSURANCE

## 2012-04-17 ENCOUNTER — Encounter (HOSPITAL_COMMUNITY): Payer: PRIVATE HEALTH INSURANCE

## 2012-04-19 ENCOUNTER — Encounter (HOSPITAL_COMMUNITY): Payer: PRIVATE HEALTH INSURANCE

## 2012-04-21 ENCOUNTER — Encounter (HOSPITAL_COMMUNITY): Payer: PRIVATE HEALTH INSURANCE

## 2012-04-24 ENCOUNTER — Encounter (HOSPITAL_COMMUNITY): Payer: PRIVATE HEALTH INSURANCE

## 2012-04-26 ENCOUNTER — Encounter (HOSPITAL_COMMUNITY): Payer: PRIVATE HEALTH INSURANCE

## 2012-04-28 ENCOUNTER — Encounter (HOSPITAL_COMMUNITY): Payer: PRIVATE HEALTH INSURANCE

## 2012-05-01 ENCOUNTER — Encounter (HOSPITAL_COMMUNITY): Payer: PRIVATE HEALTH INSURANCE

## 2012-05-03 ENCOUNTER — Encounter (HOSPITAL_COMMUNITY): Payer: PRIVATE HEALTH INSURANCE

## 2012-05-04 ENCOUNTER — Institutional Professional Consult (permissible substitution): Payer: PRIVATE HEALTH INSURANCE | Admitting: Pulmonary Disease

## 2012-05-05 ENCOUNTER — Encounter (HOSPITAL_COMMUNITY): Payer: PRIVATE HEALTH INSURANCE

## 2012-05-08 ENCOUNTER — Encounter (HOSPITAL_COMMUNITY): Payer: PRIVATE HEALTH INSURANCE

## 2012-05-10 ENCOUNTER — Encounter (HOSPITAL_COMMUNITY): Payer: PRIVATE HEALTH INSURANCE

## 2012-05-12 ENCOUNTER — Encounter (HOSPITAL_COMMUNITY): Payer: PRIVATE HEALTH INSURANCE

## 2012-06-01 ENCOUNTER — Other Ambulatory Visit (INDEPENDENT_AMBULATORY_CARE_PROVIDER_SITE_OTHER): Payer: PRIVATE HEALTH INSURANCE

## 2012-06-01 ENCOUNTER — Other Ambulatory Visit: Payer: Self-pay | Admitting: *Deleted

## 2012-06-01 DIAGNOSIS — E7800 Pure hypercholesterolemia, unspecified: Secondary | ICD-10-CM

## 2012-06-01 DIAGNOSIS — Z79899 Other long term (current) drug therapy: Secondary | ICD-10-CM

## 2012-06-01 LAB — LIPID PANEL
LDL Cholesterol: 62 mg/dL (ref 0–99)
Total CHOL/HDL Ratio: 3
Triglycerides: 95 mg/dL (ref 0.0–149.0)

## 2012-06-01 LAB — HEPATIC FUNCTION PANEL
ALT: 24 U/L (ref 0–53)
Total Protein: 6.6 g/dL (ref 6.0–8.3)

## 2012-06-05 ENCOUNTER — Telehealth: Payer: Self-pay | Admitting: *Deleted

## 2012-06-05 NOTE — Telephone Encounter (Signed)
I spoke with pt and reviewed lab results with him.  Pt then asked about getting new prescription for lopressor. He is taking lopressor 25 mg by mouth three times daily. He has been getting 50 mg tablets.  I told pt I would review chart regarding dosing.  Notes reviewed and discharge summary from January 15, 2012 indicates pt to be on lopressor 25 mg three times daily.  I reviewed with Dr. [name] and pt can decrease this to 25 mg twice daily.  He has appt with Dr. [name] on June 21, 2012.  I called to give pt these instructions and left message to call back.  Pt's pharmacy is [facility] on [address]

## 2012-06-09 NOTE — Telephone Encounter (Signed)
Left message to call back  

## 2012-06-21 ENCOUNTER — Ambulatory Visit (INDEPENDENT_AMBULATORY_CARE_PROVIDER_SITE_OTHER): Payer: PRIVATE HEALTH INSURANCE | Admitting: Cardiovascular Disease

## 2012-06-21 ENCOUNTER — Encounter: Payer: Self-pay | Admitting: Cardiovascular Disease

## 2012-06-21 VITALS — BP 128/76 | HR 67 | Ht 70.0 in | Wt 186.1 lb

## 2012-06-21 DIAGNOSIS — I2510 Atherosclerotic heart disease of native coronary artery without angina pectoris: Secondary | ICD-10-CM

## 2012-06-21 DIAGNOSIS — R002 Palpitations: Secondary | ICD-10-CM

## 2012-06-21 MED ORDER — METOPROLOL TARTRATE 25 MG PO TABS: 25.0000 mg | ORAL_TABLET | Freq: Two times a day (BID) | ORAL | Status: DC

## 2012-06-21 NOTE — Telephone Encounter (Signed)
Pt saw Dr. [name] today and lopressor dose changed.

## 2012-06-21 NOTE — Patient Instructions (Addendum)
Your physician wants you to follow-up in: 6 months.   You will receive a reminder letter in the mail two months in advance. If you don't receive a letter, please call our office to schedule the follow-up appointment.  Your physician has recommended that you wear a holter monitor. Holter monitors are medical devices that record the heart's electrical activity. Doctors most often use these monitors to diagnose arrhythmias. Arrhythmias are problems with the speed or rhythm of the heartbeat. The monitor is a small, portable device. You can wear one while you do your normal daily activities. This is usually used to diagnose what is causing palpitations/syncope (passing out).  Your physician has recommended you make the following change in your medication:  Decrease metoprolol tartrate to 25 mg by mouth twice daily

## 2012-06-21 NOTE — Progress Notes (Signed)
History of Present Illness: 56 yo male with history of CAD, s/p CABG with Dr. Tyrone Sage 12/10/11 ( LIMA to mid LAD, SVG-OM1 and OM2, SVG-PDA), HLD, HTN here today for cardiac follow up. CABG in May 2013 and then readmitted June 2013 with NSTEMI. The SVG to OM system was occluded and a drug eluting stent was placed in the native Circumflex. He had a moderate pericardial effusion by echo when readmitted with dyspnea in 5/13. Admitted again 01/2012 with chest pain. Medical therapy was advanced and he was placed on isosorbide. Chest pain resolved. Echo 02/22/12: Moderate LVH, EF 65-70%, trivial pericardial effusion. Patient was seen in followup 03/10/12 by Tereso Newcomer, PA-C and continued to have difficulty with intolerance to isosorbide. This was discontinued and he was placed on amlodipine. He called in with continued chest pain and cardiac rehabilitation. He was then started on Ranexa and his chest pain was much improved. Last visit here 03/29/12 with Tereso Newcomer, PA-C.   He is here today for followup. He continues to have mild chest pressure when he starts to exercise. This is not a pain. He has slight dyspnea with exertion. He continues to feel dizzy especially with standing. He denies syncope. He denies orthopnea, PND or edema. He does have palpitations several times per week.  Feels like a skipped beat.   Primary Care Physician: Darral Dash  Last Lipid Profile:Lipid Panel     Component Value Date/Time   CHOL 118 06/01/2012 0901   TRIG 95.0 06/01/2012 0901   HDL 36.90* 06/01/2012 0901   CHOLHDL 3 06/01/2012 0901   VLDL 19.0 06/01/2012 0901   LDLCALC 62 06/01/2012 0901     Past Medical History  Diagnosis Date  . Coronary artery disease     s/p 4V CABG 12/10/11 (LIMA to LAD, sequential SVG to OM1 & OM2, SVG to PDA);  NSTEMI 01/13/12:  LHC 01/14/12: pLAD 40%, mLAD occluded, Dx 40%, pCFX 90%, OM1 small and occluded at the ostium, OM2 small with 99% stenosis, proximal and mid RCA 50%, mRCA occluded,  S-OM1 and OM2 occluded, S-PDA okay, LIMA-LAD okay, EF 60%.  PCI: Promus DES to the pCFX    . HLD (hyperlipidemia)   . History of tobacco abuse     0.5 packs/day for 25 years, quit 2005  . Fibromyalgia   . Chronic neck pain   . Headache   . Pericardial effusion     echo 12/29/11 small to moderate pericardial effusion primarily posteriorly and anterolaterally; echo 01/14/12: Mild LVH, EF 65%, moderate pericardial effusion circumferential to the heart with no evidence of Tamponade-similar to 12/29/11 ; trivial effusion by echo 7/13  . Cough   . Hypertension   . Pneumonia     HX OF pna  . Chest pain     Imdur added during admxn 7/13=> intol 2/2 HA (stopped): Norvasc added 8/13;  Ranexa 500 bid added 8/13 (QTc stable) => CP much improved    Past Surgical History  Procedure Date  . Coronary artery bypass graft 12/10/2011    Procedure: CORONARY ARTERY BYPASS GRAFTING (CABG);  Surgeon: Delight Ovens, MD;  Location: [facility] OR;  Service: Open Heart Surgery;  Laterality: N/A;  Times four  using endoscopically harvested right greater saphenous and left internal mammary artery.  [name] Appendectomy   . Mouth surgery   . Nasal sinus surgery     Current Outpatient Prescriptions  Medication Sig Dispense Refill  . amLODipine (NORVASC) 5 MG tablet Take 0.5 tablets (2.5 mg total) by mouth  daily.  30 tablet  11  . aspirin 81 MG EC tablet Take 1 tablet (81 mg total) by mouth daily.      [name] atorvastatin (LIPITOR) 80 MG tablet Take 1 tablet (80 mg total) by mouth daily.  30 tablet  6  . metoprolol tartrate (LOPRESSOR) 25 MG tablet Take 25 mg by mouth 3 (three) times daily.      . nitroGLYCERIN (NITROSTAT) 0.4 MG SL tablet Place 1 tablet (0.4 mg total) under the tongue every 5 (five) minutes as needed for chest pain (up to 3 doses).  25 tablet  3  . ranolazine (RANEXA) 500 MG 12 hr tablet Take 1 tablet (500 mg total) by mouth 2 (two) times daily.  60 tablet  9  . Ticagrelor (BRILINTA) 90 MG TABS tablet Take 1 tablet  (90 mg total) by mouth 2 (two) times daily.  60 tablet  6    No Known Allergies  History   Social History  . Marital Status: Married    Spouse Name: N/A    Number of Children: N/A  . Years of Education: N/A   Occupational History  . Not on file.   Social History Main Topics  . Smoking status: Former Smoker -- 0.5 packs/day for 25 years    Types: Cigarettes    Quit date: 12/07/2003  . Smokeless tobacco: Never Used  . Alcohol Use: No  . Drug Use: No  . Sexually Active: Yes   Other Topics Concern  . Not on file   Social History Narrative  . No narrative on file    No family history on file.  Review of Systems:  As stated in the HPI and otherwise negative.   BP 128/76  Pulse 67  Ht 5\' 10"  (1.778 m)  Wt 186 lb 1.9 oz (84.423 kg)  BMI 26.71 kg/m2  SpO2 96%  Physical Examination: General: Well developed, well nourished, NAD HEENT: OP clear, mucus membranes moist SKIN: warm, dry. No rashes. Neuro: No focal deficits Musculoskeletal: Muscle strength 5/5 all ext Psychiatric: Mood and affect normal Neck: No JVD, no carotid bruits, no thyromegaly, no lymphadenopathy. Lungs:Clear bilaterally, no wheezes, rhonci, crackles Cardiovascular: Regular rate and rhythm. No murmurs, gallops or rubs. Abdomen:Soft. Bowel sounds present. Non-tender.  Extremities: No lower extremity edema. Pulses are 2 + in the bilateral DP/PT.   Assessment and Plan:   1. CAD: Chest Pain is improved on Ranexa. Will continue current medical therapy. Continue with cardiac rehabilitation.   2. Hyperlipidemia: Continue Lipitor.   3. Palpitations: Likely premature beats. Will arrane 48 hour holter monitor.

## 2012-07-04 ENCOUNTER — Encounter (INDEPENDENT_AMBULATORY_CARE_PROVIDER_SITE_OTHER): Payer: PRIVATE HEALTH INSURANCE

## 2012-07-04 DIAGNOSIS — R002 Palpitations: Secondary | ICD-10-CM

## 2012-07-05 ENCOUNTER — Telehealth: Payer: Self-pay | Admitting: Cardiovascular Disease

## 2012-07-05 ENCOUNTER — Encounter (INDEPENDENT_AMBULATORY_CARE_PROVIDER_SITE_OTHER): Payer: PRIVATE HEALTH INSURANCE

## 2012-07-05 DIAGNOSIS — M7989 Other specified soft tissue disorders: Secondary | ICD-10-CM

## 2012-07-05 DIAGNOSIS — M79609 Pain in unspecified limb: Secondary | ICD-10-CM

## 2012-07-05 NOTE — Telephone Encounter (Signed)
Michael Spears, if he is still having swelling, we could arrange venous dopplers to exclude DVT in that leg. [name]

## 2012-07-05 NOTE — Telephone Encounter (Signed)
Pt calling re right leg having pain, some swelling, a couple days ago, stopped and came back today, pls advise

## 2012-07-05 NOTE — Telephone Encounter (Signed)
Spoke with pt.  He reports right leg swelling that has been present since surgery in May.  Was present when he saw Dr. [name] in November and has not worsened.  He is trying to keep leg elevated but it is difficult with his job.  Also reports pain near incision on right leg. Pain is random and does not get worse with exercise or activity.  Does not happen everyday. Had pain this AM but it is now gone.  Feet and toes are warm. No discoloration. States toes ache at times.  He has not taken anything for pain.  I told pt he could take Tylenol for pain and to follow label instructions for dosing.  Also try to keep leg elevated.  He will let us know if symptoms worsen.

## 2012-07-05 NOTE — Telephone Encounter (Signed)
Spoke with pt and he will come in for venous doppler today at 4:00.

## 2012-07-10 ENCOUNTER — Telehealth: Payer: Self-pay | Admitting: Cardiovascular Disease

## 2012-07-10 NOTE — Telephone Encounter (Signed)
Message left that doppler was negative for dvt

## 2012-07-10 NOTE — Telephone Encounter (Signed)
Pt returning nurse call, plz leave a msg for pt at [phone]

## 2012-07-21 ENCOUNTER — Telehealth: Payer: Self-pay | Admitting: Cardiovascular Disease

## 2012-07-21 NOTE — Telephone Encounter (Signed)
Attempted to call pt to give results of Holter monitor which showed PACs, PVCs. Could not leave message since mailbox was full. Will have triage nursing try back on Monday. cdm

## 2012-07-24 NOTE — Telephone Encounter (Signed)
Mailbox full and unable to take messages, will try again later

## 2012-07-24 NOTE — Telephone Encounter (Signed)
Left message for call back.

## 2012-08-03 ENCOUNTER — Other Ambulatory Visit (HOSPITAL_COMMUNITY): Payer: Self-pay | Admitting: Cardiology

## 2012-08-03 NOTE — Telephone Encounter (Signed)
Tried again to reach pt. Mailbox full. Will continue to try to reach pt

## 2012-08-08 ENCOUNTER — Encounter: Payer: Self-pay | Admitting: *Deleted

## 2012-08-08 NOTE — Telephone Encounter (Signed)
Letter sent asking pt to contact office to review results.

## 2012-08-16 ENCOUNTER — Telehealth: Payer: Self-pay | Admitting: Cardiovascular Disease

## 2012-08-16 NOTE — Telephone Encounter (Signed)
New Problem:    Patient returned Pat's letter regarding his holtor monitor results.  See previous phone note.  Please call back and feel free to leave a message.

## 2012-08-16 NOTE — Telephone Encounter (Signed)
N/A.  LMTC. 

## 2012-08-18 NOTE — Telephone Encounter (Signed)
Result message left for pt

## 2012-08-18 NOTE — Telephone Encounter (Signed)
Message of monitor results left on pt's voicemail

## 2012-08-31 ENCOUNTER — Telehealth: Payer: Self-pay | Admitting: Cardiovascular Disease

## 2012-08-31 NOTE — Telephone Encounter (Signed)
New  Problem:   Insurance won't refill medication ranexa . Due to similar mediation . Prior authorization.    ranexa 500 mg.

## 2012-08-31 NOTE — Telephone Encounter (Signed)
Attempted to call pt. Mailbox full. Option to leave call back number.  I left office number as call back number

## 2012-09-01 NOTE — Telephone Encounter (Signed)
I spoke with pt's pharmacy ([facility] [address]) and prior authorization has been accepted and no additional information is needed. I tried again to reach pt but home and cell voicemail boxes are full.

## 2012-09-16 ENCOUNTER — Other Ambulatory Visit: Payer: Self-pay

## 2012-09-22 ENCOUNTER — Telehealth: Payer: Self-pay | Admitting: Cardiovascular Disease

## 2012-09-22 NOTE — Telephone Encounter (Signed)
Per pt call  -  Very anxious about his cholesterol and follow up visit.  Reviewed results from 06/01/12 and he is aware those numbers look good - no changes needed to be made in his medications.  He is aware Dr [name] needs to see him back in May of this year.  Pt has complaints of continued fatigue but admits he has not had the sleep study that was ordered for him to evaluate for sleep apnea.  Encouraged pt to consider this testing.  Pt also complains of constipation.  Advised to increase fiber, drink plenty of fluids and to take OTC stool softener if necessary.  He states understanding.

## 2012-09-22 NOTE — Telephone Encounter (Signed)
New Prob   Pt is requesting some lab work to be done. A little concerned about his cholesterol. Would like to speak to nurse.

## 2012-09-30 ENCOUNTER — Other Ambulatory Visit: Payer: Self-pay | Admitting: Cardiovascular Disease

## 2012-10-07 IMAGING — CR DG CHEST 1V PORT
1 series · 1 of 1 positions shown · non-contrast
Comparison: 01/13/2012

CLINICAL DATA: Chest pain

PORTABLE CHEST - 1 VIEW

[AP]
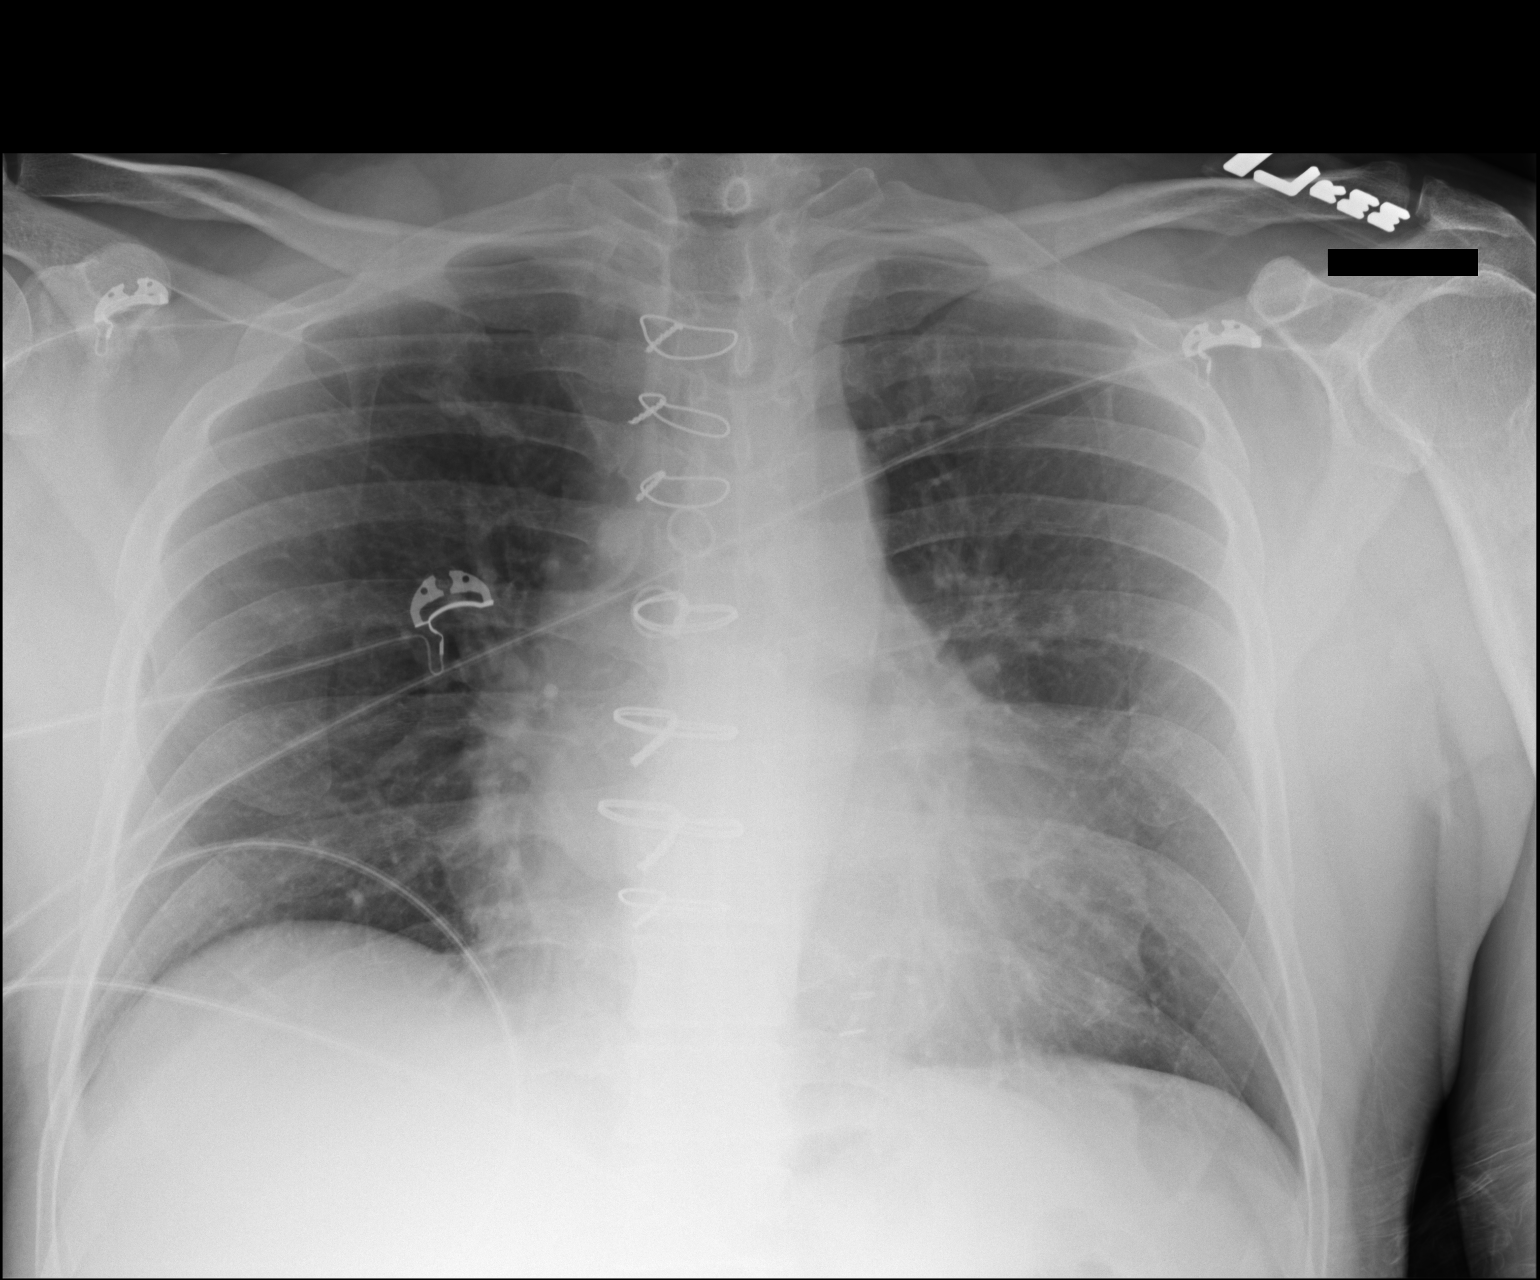

[1 of 1 positions shown; findings below may reference images not displayed]

FINDINGS: Cardiomediastinal silhouette is stable.  Status post
median sternotomy again noted.  No acute infiltrate or pleural
effusion.  No pulmonary edema.  Bony thorax is stable.
IMPRESSION: No active disease.  No significant change.

## 2012-11-03 ENCOUNTER — Telehealth: Payer: Self-pay | Admitting: Cardiovascular Disease

## 2012-11-03 NOTE — Telephone Encounter (Signed)
Spoke with [facility]'s [facility] ([facility] [address] They state pt's insurance will not allow them to fill amlodipine and Ranexa within 5 days of each other as they are similar medications. [facility] there is no contraindication for pt to be on both medications at the same time.  [facility] to our office but we have not received. I asked them to refax. Phone for insurance company is [phone]. We have samples in office that pt can pick up.  I tried to reach pt on call back number listed but number is not in service. Left message on home number to call back

## 2012-11-03 NOTE — Telephone Encounter (Signed)
Spoke with pt's insurance and medication has been approved. I spoke with pt's pharmacy and confirmed they have received this information

## 2012-11-03 NOTE — Telephone Encounter (Signed)
Spoke with [employer] and gave information needed for prior authorization. I was told turn around time for decision was 5-7 days. I asked if decision could be made today as pt is out of this medication. They will send as stat request and have asked me to call back this afternoon to see if decision has been reached.  Call back number is [phone]

## 2012-11-03 NOTE — Telephone Encounter (Signed)
New Problem:    Patient called in because his pharmacy is wait for his insurance company to receive a physician's approval from our office for him to continue taking his ranolazine (RANEXA) 500 MG 12 hr tablet so they will cover it.  Please fax to: [phone].  Patient has been out of his medication for past two days.

## 2012-11-06 ENCOUNTER — Encounter: Payer: Self-pay | Admitting: Cardiovascular Disease

## 2012-11-08 ENCOUNTER — Telehealth: Payer: Self-pay | Admitting: Cardiovascular Disease

## 2012-11-08 NOTE — Telephone Encounter (Signed)
Spoke with pt. He reports headaches which started toward the end of last week. Will go away but then return. Will sometimes feel in right temple area when pressing on this area. Little relief with Tylenol. No recent change in medications. No cold or allergy symptoms. He is asking if there is anything else he can take. I told him that if Tylenol did not help he should follow up with primary care for their recommendations. He is having no cardiac complaints. He does not know blood pressure but will monitor it over the next few days and call us if elevated.

## 2012-11-08 NOTE — Telephone Encounter (Signed)
New Prob    States he is having a lot of headaches, would like to know what precautions he could take to help prevent this. Has not contacted PCP. Would like to speak to nurse.

## 2012-12-11 ENCOUNTER — Ambulatory Visit: Payer: PRIVATE HEALTH INSURANCE | Admitting: Cardiovascular Disease

## 2012-12-18 ENCOUNTER — Encounter: Payer: Self-pay | Admitting: Cardiovascular Disease

## 2012-12-20 ENCOUNTER — Encounter: Payer: Self-pay | Admitting: Cardiovascular Disease

## 2012-12-20 ENCOUNTER — Ambulatory Visit (INDEPENDENT_AMBULATORY_CARE_PROVIDER_SITE_OTHER): Payer: PRIVATE HEALTH INSURANCE | Admitting: Cardiovascular Disease

## 2012-12-20 VITALS — BP 116/64 | HR 82 | Ht 70.0 in | Wt 182.0 lb

## 2012-12-20 DIAGNOSIS — R079 Chest pain, unspecified: Secondary | ICD-10-CM

## 2012-12-20 DIAGNOSIS — I2510 Atherosclerotic heart disease of native coronary artery without angina pectoris: Secondary | ICD-10-CM

## 2012-12-20 MED ORDER — CLOPIDOGREL BISULFATE 75 MG PO TABS: 75.0000 mg | ORAL_TABLET | Freq: Every day | ORAL | Status: DC

## 2012-12-20 NOTE — Progress Notes (Signed)
History of Present Illness: 57 yo male with history of CAD s/p CABG with Dr. Tyrone Sage 12/10/11 ( LIMA to mid LAD, SVG-OM1 and OM2, SVG-PDA), HLD, HTN here today for cardiac follow up. CABG in May 2013 and then readmitted June 2013 with NSTEMI. The SVG to OM system was occluded and a drug eluting stent was placed in the native Circumflex. He had a moderate pericardial effusion by echo when readmitted with dyspnea in 5/13. Admitted again 01/2012 with chest pain. Medical therapy was advanced and he was placed on isosorbide. Chest pain resolved. Echo 02/22/12: Moderate LVH, EF 65-70%, trivial pericardial effusion. Patient was seen in followup 03/10/12 by Tereso Newcomer, PA-C and continued to have difficulty with intolerance to isosorbide. This was discontinued and he was placed on amlodipine. He called in with continued chest pain and cardiac rehabilitation. He was then started on Ranexa and his chest pain was much improved. Last visit here 06/21/12 and c/o dizziness and palpitations. 48 hour monitor December 2013 showed rare PVCs, PACs. He has had right leg swelling since his CABG. LE venous doppler negative for DVT December 2013.    He is here today for followup. He c/o fatigue, headaches, nausea, muscle stiffness, dizzy spells, swelling right leg. His symptoms of dizziness, fatigue occur  mostly when walking  Primary Care Physician: [name]  Last Lipid Profile:Lipid Panel     Component Value Date/Time   CHOL 118 06/01/2012 0901   TRIG 95.0 06/01/2012 0901   HDL 36.90* 06/01/2012 0901   CHOLHDL 3 06/01/2012 0901   VLDL 19.0 06/01/2012 0901   LDLCALC 62 06/01/2012 0901     Past Medical History  Diagnosis Date  . Coronary artery disease     s/p 4V CABG 12/10/11 (LIMA to LAD, sequential SVG to OM1 & OM2, SVG to PDA);  NSTEMI 01/13/12:  LHC 01/14/12: pLAD 40%, mLAD occluded, Dx 40%, pCFX 90%, OM1 small and occluded at the ostium, OM2 small with 99% stenosis, proximal and mid RCA 50%, mRCA occluded, S-OM1  and OM2 occluded, S-PDA okay, LIMA-LAD okay, EF 60%.  PCI: Promus DES to the pCFX    . HLD (hyperlipidemia)   . History of tobacco abuse     0.5 packs/day for 25 years, quit 2005  . Fibromyalgia   . Chronic neck pain   . Headache   . Pericardial effusion     echo 12/29/11 small to moderate pericardial effusion primarily posteriorly and anterolaterally; echo 01/14/12: Mild LVH, EF 65%, moderate pericardial effusion circumferential to the heart with no evidence of Tamponade-similar to 12/29/11 ; trivial effusion by echo 7/13  . Cough   . Hypertension   . Pneumonia     HX OF pna  . Chest pain     Imdur added during admxn 7/13=> intol 2/2 HA (stopped): Norvasc added 8/13;  Ranexa 500 bid added 8/13 (QTc stable) => CP much improved    Past Surgical History  Procedure Laterality Date  . Coronary artery bypass graft  12/10/2011    Procedure: CORONARY ARTERY BYPASS GRAFTING (CABG);  Surgeon: Delight Ovens, MD;  Location: [facility] OR;  Service: Open Heart Surgery;  Laterality: N/A;  Times four  using endoscopically harvested right greater saphenous and left internal mammary artery.  [name] Appendectomy    . Mouth surgery    . Nasal sinus surgery      Current Outpatient Prescriptions  Medication Sig Dispense Refill  . amLODipine (NORVASC) 5 MG tablet Take 0.5 tablets (2.5 mg total) by mouth  daily.  30 tablet  11  . aspirin 81 MG EC tablet Take 1 tablet (81 mg total) by mouth daily.      [name] atorvastatin (LIPITOR) 80 MG tablet TAKE 1 TABLET (80 MG TOTAL) BY MOUTH DAILY.  30 tablet  6  . BRILINTA 90 MG TABS tablet TAKE 1 TABLET (90 MG TOTAL) BY MOUTH 2 (TWO) TIMES DAILY.  60 tablet  6  . metoprolol tartrate (LOPRESSOR) 25 MG tablet Take 1 tablet (25 mg total) by mouth 2 (two) times daily.  60 tablet  11  . nitroGLYCERIN (NITROSTAT) 0.4 MG SL tablet Place 1 tablet (0.4 mg total) under the tongue every 5 (five) minutes as needed for chest pain (up to 3 doses).  25 tablet  3  . ranolazine (RANEXA) 500 MG 12 hr  tablet Take 1 tablet (500 mg total) by mouth 2 (two) times daily.  60 tablet  9   No current facility-administered medications for this visit.    No Known Allergies  History   Social History  . Marital Status: Married    Spouse Name: N/A    Number of Children: N/A  . Years of Education: N/A   Occupational History  . Not on file.   Social History Main Topics  . Smoking status: Former Smoker -- 0.50 packs/day for 25 years    Types: Cigarettes    Quit date: 12/07/2003  . Smokeless tobacco: Never Used  . Alcohol Use: No  . Drug Use: No  . Sexually Active: Yes   Other Topics Concern  . Not on file   Social History Narrative  . No narrative on file    No family history on file.  Review of Systems:  As stated in the HPI and otherwise negative.   BP 116/64  Pulse 82  Ht 5\' 10"  (1.778 m)  Wt 182 lb (82.555 kg)  BMI 26.11 kg/m2  Physical Examination: General: Well developed, well nourished, NAD HEENT: OP clear, mucus membranes moist SKIN: warm, dry. No rashes. Neuro: No focal deficits Musculoskeletal: Muscle strength 5/5 all ext Psychiatric: Mood and affect normal Neck: No JVD, no carotid bruits, no thyromegaly, no lymphadenopathy. Lungs:Clear bilaterally, no wheezes, rhonci, crackles Cardiovascular: Regular rate and rhythm. No murmurs, gallops or rubs. Abdomen:Soft. Bowel sounds present. Non-tender.  Extremities: No lower extremity edema. Pulses are 2 + in the bilateral DP/PT.  Assessment and Plan:   1. CAD: He is having dizziness, fatigue and chest pressure with exercise. Will continue ASA, statin, beta blocker, Ranexa. Change Brilinta to Plavix 75 mg po Qdaily. Will arrange exercise stress myoview to exclude ischemia.    2. Hyperlipidemia: Continue Lipitor.   3. Palpitations: Resolved. Likely due to PVCs.

## 2012-12-20 NOTE — Patient Instructions (Addendum)
Your physician recommends that you schedule a follow-up appointment in:  5-6 weeks.   Your physician has requested that you have an exercise stress myoview. For further information please visit [email or web address] Please follow instruction sheet, as given.  Your physician has recommended you make the following change in your medication:  Finish the Brilinta that you have. When finished change to Clopidogrel 75 mg by mouth daily

## 2013-01-11 ENCOUNTER — Encounter: Payer: Self-pay | Admitting: Cardiovascular Disease

## 2013-01-11 ENCOUNTER — Telehealth: Payer: Self-pay | Admitting: *Deleted

## 2013-01-11 NOTE — Telephone Encounter (Signed)
---   Message -----    From: Francena Hanly    Sent: 01/11/2013  7:22 AM EDT      To: Verne Carrow, MD Subject: Non-Urgent Medical Question  still having leg pain, don't think Plavix is so great, felt better on Brilinta,. still getting headaches and feeling really fatigued.      trying to exercise  but difficult when I'm so tired, sometimes it's really hard to think clearly.   fingers feel numb in the morning when I wake up goes away after a few, is that normal or should I be concerned?  Received above message from my chart message from pt. I replied to him that I would call to discuss his symptoms.  I called number listed for him and left message for him to call back

## 2013-01-18 NOTE — Telephone Encounter (Signed)
Follow-up:    Patient called in wanting to speak with you.  States that he will try to call back later.

## 2013-01-18 NOTE — Telephone Encounter (Signed)
Left message to call back  

## 2013-01-22 ENCOUNTER — Ambulatory Visit (HOSPITAL_COMMUNITY): Payer: PRIVATE HEALTH INSURANCE | Attending: Cardiovascular Disease | Admitting: Radiology

## 2013-01-22 VITALS — BP 125/77 | Ht 70.0 in | Wt 182.0 lb

## 2013-01-22 DIAGNOSIS — R110 Nausea: Secondary | ICD-10-CM | POA: Insufficient documentation

## 2013-01-22 DIAGNOSIS — R42 Dizziness and giddiness: Secondary | ICD-10-CM | POA: Insufficient documentation

## 2013-01-22 DIAGNOSIS — R0602 Shortness of breath: Secondary | ICD-10-CM

## 2013-01-22 DIAGNOSIS — I4949 Other premature depolarization: Secondary | ICD-10-CM

## 2013-01-22 DIAGNOSIS — R5383 Other fatigue: Secondary | ICD-10-CM | POA: Insufficient documentation

## 2013-01-22 DIAGNOSIS — I2510 Atherosclerotic heart disease of native coronary artery without angina pectoris: Secondary | ICD-10-CM

## 2013-01-22 DIAGNOSIS — R079 Chest pain, unspecified: Secondary | ICD-10-CM

## 2013-01-22 DIAGNOSIS — R5381 Other malaise: Secondary | ICD-10-CM | POA: Insufficient documentation

## 2013-01-22 MED ORDER — REGADENOSON 0.4 MG/5ML IV SOLN
0.4000 mg | Freq: Once | INTRAVENOUS | Status: AC
  Administered 2013-01-22: 0.4 mg via INTRAVENOUS

## 2013-01-22 MED ORDER — TECHNETIUM TC 99M SESTAMIBI GENERIC - CARDIOLITE
10.0000 | Freq: Once | INTRAVENOUS | Status: AC | PRN
  Administered 2013-01-22: 10 via INTRAVENOUS

## 2013-01-22 MED ORDER — TECHNETIUM TC 99M SESTAMIBI GENERIC - CARDIOLITE
30.0000 | Freq: Once | INTRAVENOUS | Status: AC | PRN
  Administered 2013-01-22: 30 via INTRAVENOUS

## 2013-01-22 NOTE — Progress Notes (Signed)
[facility] SITE 3 NUCLEAR MED [address] [place], [place] [address] [phone]    Cardiology Nuclear Med Study  Michael Spears is a 57 y.o. male     MRN : [id number]     DOB: 1956-01-10  Procedure Date: 01/22/2013  Nuclear Med Background Indication for Stress Test:  Evaluation for Ischemia, Graft Patency and Stent Patency History:  5/13 CABG;6/13 MI,Heart Catheterization>EF=60%,stent to CFX,SVG>OM1 and OM2 occluded;7/13 Echo: EF=65-70% Cardiac Risk Factors: History of Smoking, Hypertension and Lipids  Symptoms:  Chest Pain/Pressure with Exertion (last date of chest discomfort approx a week ago), Dizziness, DOE, Fatigue and Nausea   Nuclear Pre-Procedure Caffeine/Decaff Intake:  None NPO After: 6:30pm   Lungs:  clear O2 Sat: 95% on room air. IV 0.9% NS with Angio Cath:  20g  IV Site: R Forearm  IV Started by:  Stanton Kidney, EMT-P  Chest Size (in):  42 Cup Size: n/a  Height: 5\' 10"  (1.778 m)  Weight:  182 lb (82.555 kg)  BMI:  Body mass index is 26.11 kg/(m^2). Tech Comments:  Metoprolol held > 24 hours, per patient.    Nuclear Med Study 1 or 2 day study: 1 day  Stress Test Type:  Treadmill/Lexiscan  Reading MD: Charlton Haws, MD  Order Authorizing Provider:  Tedra Senegal  Resting Radionuclide: Technetium 21m Sestamibi  Resting Radionuclide Dose: 11.0 mCi   Stress Radionuclide:  Technetium 21m Sestamibi  Stress Radionuclide Dose: 33.0 mCi           Stress Protocol Rest HR: 64 Stress HR: 122  Rest BP: 125/77 Stress BP: 150/78  Exercise Time (min): 9:00 METS: 9.8   Predicted Max HR: 163 bpm % Max HR: 74.85 bpm Rate Pressure Product: 21308   Dose of Adenosine (mg):  n/a Dose of Lexiscan: 0.4 mg  Dose of Atropine (mg): n/a Dose of Dobutamine: n/a mcg/kg/min (at max HR)  Stress Test Technologist: Cathlyn Parsons, RN  Nuclear Technologist:  Domenic Polite, CNMT     Rest Procedure:  Myocardial perfusion imaging was performed at rest 45 minutes  following the intravenous administration of Technetium 44m Sestamibi. Rest ECG: NSR insignificant Q waves in inferior leads  Stress Procedure:  The patient exercised on the treadmill utilizing the Bruce Protocol for 9:00 minutes. The patient stopped due to dizziness,SOB and fatigue and denied any chest pain. Patient developed chest pressure 2/10 with exercise.  Patient unable to reach target Heart rate and changed to a Lexiscan. The patient received IV Lexiscan 0.4mg  over 15 seconds. Myoview was injected at 30 seconds. Patient became very dizzy,pale,hypotensive and diaphoretic. Patient placed in trendelenburg position and 250 cc NS infused.Patient's hypotensive response resolved late in recovery.There were no significant changes with Lexiscan. Quantitative spect images were obtained after a 45 minute delay. Stress ECG: No significant change from baseline ECG  QPS Raw Data Images:  Normal; no motion artifact; normal heart/lung ratio. Stress Images:  Normal homogeneous uptake in all areas of the myocardium. Rest Images:  Normal homogeneous uptake in all areas of the myocardium. Subtraction (SDS):  Normal Transient Ischemic Dilatation (Normal <1.22):  1.01 Lung/Heart Ratio (Normal <0.45):  0.38  Quantitative Gated Spect Images QGS EDV:  81 ml QGS ESV:  27 ml  Impression Exercise Capacity:  Good exercise capacity. BP Response:  Hypotensive blood pressure response. Clinical Symptoms:  Dyspnea and chest pain ECG Impression:  No significant ST segment change suggestive of ischemia. Comparison with Prior Nuclear Study: No images to compare  Overall Impression:  Low risk stress nuclear study Normal ECG response and normal nuclear images Chest pain and dyspnea with exercise.  ? Vagal response post exercise with hypotension requiring saline.  LV Ejection Fraction: 67%.  LV Wall Motion:  NL LV Function; NL Wall Motion  Charlton Haws

## 2013-01-23 NOTE — Telephone Encounter (Signed)
Reviewed, see stress test report. cdm

## 2013-01-24 ENCOUNTER — Encounter: Payer: Self-pay | Admitting: Cardiovascular Disease

## 2013-01-29 NOTE — Telephone Encounter (Signed)
Dr. [name] has responded to pt's my chart message with results of stress test.

## 2013-02-03 ENCOUNTER — Encounter: Payer: Self-pay | Admitting: Cardiovascular Disease

## 2013-02-04 ENCOUNTER — Other Ambulatory Visit: Payer: Self-pay | Admitting: Physician Assistant

## 2013-02-05 ENCOUNTER — Ambulatory Visit: Payer: PRIVATE HEALTH INSURANCE | Admitting: Cardiovascular Disease

## 2013-03-05 ENCOUNTER — Encounter: Payer: Self-pay | Admitting: Cardiovascular Disease

## 2013-03-08 ENCOUNTER — Other Ambulatory Visit: Payer: Self-pay | Admitting: Physician Assistant

## 2013-03-16 ENCOUNTER — Ambulatory Visit: Payer: PRIVATE HEALTH INSURANCE | Admitting: Cardiovascular Disease

## 2013-04-07 ENCOUNTER — Other Ambulatory Visit: Payer: Self-pay | Admitting: Physician Assistant

## 2013-04-09 ENCOUNTER — Encounter: Payer: Self-pay | Admitting: Cardiovascular Disease

## 2013-05-09 ENCOUNTER — Other Ambulatory Visit: Payer: Self-pay | Admitting: Cardiovascular Disease

## 2013-06-07 ENCOUNTER — Other Ambulatory Visit: Payer: Self-pay

## 2013-06-09 ENCOUNTER — Other Ambulatory Visit: Payer: Self-pay | Admitting: Cardiovascular Disease

## 2013-06-12 ENCOUNTER — Encounter: Payer: Self-pay | Admitting: Cardiovascular Disease

## 2013-07-02 ENCOUNTER — Other Ambulatory Visit: Payer: Self-pay | Admitting: Cardiovascular Disease

## 2013-08-07 ENCOUNTER — Other Ambulatory Visit: Payer: Self-pay | Admitting: Cardiovascular Disease

## 2013-08-08 ENCOUNTER — Other Ambulatory Visit: Payer: Self-pay | Admitting: Cardiovascular Disease

## 2013-08-08 MED ORDER — METOPROLOL TARTRATE 25 MG PO TABS: ORAL_TABLET | ORAL | Status: DC

## 2013-08-24 ENCOUNTER — Encounter: Payer: Self-pay | Admitting: Cardiovascular Disease

## 2013-09-05 ENCOUNTER — Other Ambulatory Visit: Payer: Self-pay | Admitting: Cardiovascular Disease

## 2013-09-07 ENCOUNTER — Other Ambulatory Visit: Payer: Self-pay | Admitting: Cardiovascular Disease

## 2013-09-10 ENCOUNTER — Other Ambulatory Visit: Payer: Self-pay | Admitting: Cardiovascular Disease

## 2013-09-14 ENCOUNTER — Ambulatory Visit: Payer: PRIVATE HEALTH INSURANCE | Admitting: Cardiovascular Disease

## 2013-09-20 ENCOUNTER — Telehealth: Payer: Self-pay | Admitting: Cardiovascular Disease

## 2013-09-20 NOTE — Telephone Encounter (Signed)
New message    Patient would like to have fasting lab on tomorrow. No order in the system .  Please advise patient is asking for e-mail to be sent to him.

## 2013-09-20 NOTE — Telephone Encounter (Signed)
Message has already been sent to pt via my chart that labs would be done at appointment with Dr. Clifton JamesMcAlhany on 09/21/13.

## 2013-09-21 ENCOUNTER — Encounter: Payer: Self-pay | Admitting: Cardiovascular Disease

## 2013-09-21 ENCOUNTER — Ambulatory Visit (INDEPENDENT_AMBULATORY_CARE_PROVIDER_SITE_OTHER): Payer: PRIVATE HEALTH INSURANCE | Admitting: Cardiovascular Disease

## 2013-09-21 VITALS — BP 133/85 | HR 62 | Ht 70.0 in | Wt 185.0 lb

## 2013-09-21 DIAGNOSIS — R002 Palpitations: Secondary | ICD-10-CM

## 2013-09-21 DIAGNOSIS — I2510 Atherosclerotic heart disease of native coronary artery without angina pectoris: Secondary | ICD-10-CM

## 2013-09-21 DIAGNOSIS — R42 Dizziness and giddiness: Secondary | ICD-10-CM

## 2013-09-21 DIAGNOSIS — E785 Hyperlipidemia, unspecified: Secondary | ICD-10-CM

## 2013-09-21 LAB — BASIC METABOLIC PANEL
BUN: 10 mg/dL (ref 6–23)
CO2: 29 meq/L (ref 19–32)
CREATININE: 1.3 mg/dL (ref 0.4–1.5)
Calcium: 9.7 mg/dL (ref 8.4–10.5)
Chloride: 107 mEq/L (ref 96–112)
GFR: 61.26 mL/min (ref >60.00)
Glucose, Bld: 102 mg/dL — ABNORMAL HIGH (ref 70–99)
Potassium: 4.1 mEq/L (ref 3.5–5.1)
SODIUM: 140 meq/L (ref 135–145)

## 2013-09-21 LAB — CBC WITH DIFFERENTIAL/PLATELET
BASOS PCT: 0.3 % (ref 0.0–3.0)
Basophils Absolute: 0 10*3/uL (ref 0.0–0.1)
EOS PCT: 1.8 % (ref 0.0–5.0)
Eosinophils Absolute: 0.1 10*3/uL (ref 0.0–0.7)
HEMATOCRIT: 47.5 % (ref 39.0–52.0)
HEMOGLOBIN: 16 g/dL (ref 13.0–17.0)
Lymphocytes Relative: 25.9 % (ref 12.0–46.0)
Lymphs Abs: 2 10*3/uL (ref 0.7–4.0)
MCHC: 33.6 g/dL (ref 30.0–36.0)
MCV: 90.7 fl (ref 78.0–100.0)
MONO ABS: 0.8 10*3/uL (ref 0.1–1.0)
Monocytes Relative: 9.9 % (ref 3.0–12.0)
NEUTROS ABS: 4.7 10*3/uL (ref 1.4–7.7)
Neutrophils Relative %: 62.1 % (ref 43.0–77.0)
Platelets: 201 10*3/uL (ref 150.0–400.0)
RBC: 5.24 Mil/uL (ref 4.22–5.81)
RDW: 13.6 % (ref 11.5–14.6)
WBC: 7.6 10*3/uL (ref 4.5–10.5)

## 2013-09-21 LAB — HEPATIC FUNCTION PANEL
ALT: 22 U/L (ref 0–53)
AST: 20 U/L (ref 0–37)
Albumin: 4.5 g/dL (ref 3.5–5.2)
Alkaline Phosphatase: 75 U/L (ref 39–117)
BILIRUBIN TOTAL: 0.9 mg/dL (ref 0.3–1.2)
Bilirubin, Direct: 0 mg/dL (ref 0.0–0.3)
Total Protein: 6.9 g/dL (ref 6.0–8.3)

## 2013-09-21 LAB — LIPID PANEL
CHOL/HDL RATIO: 4
Cholesterol: 154 mg/dL (ref 0–200)
HDL: 42.1 mg/dL (ref >39.00)
LDL CALC: 73 mg/dL (ref 0–99)
Triglycerides: 196 mg/dL — ABNORMAL HIGH (ref 0.0–149.0)
VLDL: 39.2 mg/dL (ref 0.0–40.0)

## 2013-09-21 LAB — TSH: TSH: 0.94 u[IU]/mL (ref 0.35–5.50)

## 2013-09-21 NOTE — Progress Notes (Signed)
History of Present Illness: 58 yo male with history of CAD s/p CABG with Dr. Tyrone Sage 12/10/11 ( LIMA to mid LAD, SVG-OM1 and OM2, SVG-PDA), HLD, HTN here today for cardiac follow up. CABG in May 2013 and then readmitted June 2013 with NSTEMI. The SVG to OM system was occluded and a drug eluting stent was placed in the native Circumflex. He had a moderate pericardial effusion by echo when readmitted with dyspnea in 5/13. Admitted again 01/2012 with chest pain. Medical therapy was advanced and he was placed on isosorbide. Chest pain resolved. Echo 02/22/12: Moderate LVH, EF 65-70%, trivial pericardial effusion. Patient was seen in followup 03/10/12 by Tereso Newcomer, PA-C and continued to have difficulty with intolerance to isosorbide. This was discontinued and he was placed on amlodipine. He called in with continued chest pain and cardiac rehabilitation. He was then started on Ranexa and his chest pain was much improved. Visit here 06/21/12 and c/o dizziness and palpitations. 48 hour monitor December 2013 showed rare PVCs, PACs. He has had right leg swelling since his CABG. LE venous doppler negative for DVT December 2013. I saw him in May 2014 and he had c/o fatigue, HA, dizziness, nausea, muscle stiffness. Stress myoview June 2014 without ischemia.   He is here today for followup. He has continued to have dizziness. No SOB. He does not feel palpitations when he is dizzy. He has been walking 3-4 days per week and lifting weights but has no pressure with this.   Primary Care Physician: [name]  Last Lipid Profile:Lipid Panel     Component Value Date/Time   CHOL 118 06/01/2012 0901   TRIG 95.0 06/01/2012 0901   HDL 36.90* 06/01/2012 0901   CHOLHDL 3 06/01/2012 0901   VLDL 19.0 06/01/2012 0901   LDLCALC 62 06/01/2012 0901     Past Medical History  Diagnosis Date  . Coronary artery disease     s/p 4V CABG 12/10/11 (LIMA to LAD, sequential SVG to OM1 & OM2, SVG to PDA);  NSTEMI 01/13/12:  LHC  01/14/12: pLAD 40%, mLAD occluded, Dx 40%, pCFX 90%, OM1 small and occluded at the ostium, OM2 small with 99% stenosis, proximal and mid RCA 50%, mRCA occluded, S-OM1 and OM2 occluded, S-PDA okay, LIMA-LAD okay, EF 60%.  PCI: Promus DES to the pCFX    . HLD (hyperlipidemia)   . History of tobacco abuse     0.5 packs/day for 25 years, quit 2005  . Fibromyalgia   . Chronic neck pain   . Headache(784.0)   . Pericardial effusion     echo 12/29/11 small to moderate pericardial effusion primarily posteriorly and anterolaterally; echo 01/14/12: Mild LVH, EF 65%, moderate pericardial effusion circumferential to the heart with no evidence of Tamponade-similar to 12/29/11 ; trivial effusion by echo 7/13  . Cough   . Hypertension   . Pneumonia     HX OF pna  . Chest pain     Imdur added during admxn 7/13=> intol 2/2 HA (stopped): Norvasc added 8/13;  Ranexa 500 bid added 8/13 (QTc stable) => CP much improved    Past Surgical History  Procedure Laterality Date  . Coronary artery bypass graft  12/10/2011    Procedure: CORONARY ARTERY BYPASS GRAFTING (CABG);  Surgeon: Delight Ovens, MD;  Location: [facility] OR;  Service: Open Heart Surgery;  Laterality: N/A;  Times four  using endoscopically harvested right greater saphenous and left internal mammary artery.  [name] Appendectomy    . Mouth surgery    .  Nasal sinus surgery      Current Outpatient Prescriptions  Medication Sig Dispense Refill  . amLODipine (NORVASC) 5 MG tablet Take 0.5 tablets (2.5 mg total) by mouth daily.  30 tablet  0  . aspirin 81 MG EC tablet Take 1 tablet (81 mg total) by mouth daily.      [name]. atorvastatin (LIPITOR) 80 MG tablet TAKE 1 TABLET EVERY DAY  30 tablet  3  . clopidogrel (PLAVIX) 75 MG tablet Take 1 tablet (75 mg total) by mouth daily.  30 tablet  11  . metoprolol tartrate (LOPRESSOR) 25 MG tablet TAKE 1 TABLET TWICE A DAY  30 tablet  0  . nitroGLYCERIN (NITROSTAT) 0.4 MG SL tablet Place 1 tablet (0.4 mg total) under the tongue  every 5 (five) minutes as needed for chest pain (up to 3 doses).  25 tablet  3  . RANEXA 500 MG 12 hr tablet TAKE 1 TABLET BY MOUTH TWICE A DAY  60 tablet  6   No current facility-administered medications for this visit.    No Known Allergies  History   Social History  . Marital Status: Married    Spouse Name: N/A    Number of Children: N/A  . Years of Education: N/A   Occupational History  . Not on file.   Social History Main Topics  . Smoking status: Former Smoker -- 0.50 packs/day for 25 years    Types: Cigarettes    Quit date: 12/07/2003  . Smokeless tobacco: Never Used  . Alcohol Use: No  . Drug Use: No  . Sexual Activity: Yes   Other Topics Concern  . Not on file   Social History Narrative  . No narrative on file    No family history on file.  Review of Systems:  As stated in the HPI and otherwise negative.   BP 133/85  Pulse 62  Ht 5\' 10"  (1.778 m)  Wt 185 lb (83.915 kg)  BMI 26.54 kg/m2  Physical Examination: General: Well developed, well nourished, NAD HEENT: OP clear, mucus membranes moist SKIN: warm, dry. No rashes. Neuro: No focal deficits Musculoskeletal: Muscle strength 5/5 all ext Psychiatric: Mood and affect normal Neck: No JVD, no carotid bruits, no thyromegaly, no lymphadenopathy. Lungs:Clear bilaterally, no wheezes, rhonci, crackles Cardiovascular: Regular rate and rhythm. No murmurs, gallops or rubs. Abdomen:Soft. Bowel sounds present. Non-tender.  Extremities: No lower extremity edema. Pulses are 2 + in the bilateral DP/PT.  Stress myoview June 2014: Stress Procedure: The patient exercised on the treadmill utilizing the Bruce Protocol for 9:00 minutes. The patient stopped due to dizziness,SOB and fatigue and denied any chest pain. Patient developed chest pressure 2/10 with exercise. Patient unable to reach target Heart rate and changed to a Lexiscan. The patient received IV Lexiscan 0.4mg  over 15 seconds. Myoview was injected at 30  seconds. Patient became very dizzy,pale,hypotensive and diaphoretic. Patient placed in trendelenburg position and 250 cc NS infused.Patient's hypotensive response resolved late in recovery.There were no significant changes with Lexiscan. Quantitative spect images were obtained after a 45 minute delay.  Stress ECG: No significant change from baseline ECG  QPS  Raw Data Images: Normal; no motion artifact; normal heart/lung ratio.  Stress Images: Normal homogeneous uptake in all areas of the myocardium.  Rest Images: Normal homogeneous uptake in all areas of the myocardium.  Subtraction (SDS): Normal  Transient Ischemic Dilatation (Normal <1.22): 1.01  Lung/Heart Ratio (Normal <0.45): 0.38  Quantitative Gated Spect Images  QGS EDV: 81 ml  QGS ESV: 27 ml  Impression  Exercise Capacity: Good exercise capacity.  BP Response: Hypotensive blood pressure response.  Clinical Symptoms: Dyspnea and chest pain  ECG Impression: No significant ST segment change suggestive of ischemia.  Comparison with Prior Nuclear Study: No images to compare  Overall Impression: Low risk stress nuclear study Normal ECG response and normal nuclear images Chest pain and dyspnea with exercise. ? Vagal response post exercise with hypotension requiring saline.  LV Ejection Fraction: 67%. LV Wall Motion: NL LV Function; NL Wall Motion  EKG: NSR, rate 62  Bpm. Old inferior MI  Assessment and Plan:   1. CAD: He has had no exertional chest pain but still having dizziness. Stress test without ischemia June 2014. Will continue ASA, Plavix, statin, Ranexa.  Stopping beta blocker due to fatigue and dizziness.   2. Hyperlipidemia: Continue Lipitor. Check lipids and LFTs today  3. Palpitations: Rare. Likely due to PVCs.   4. Dizziness: Will check TSH, CBC, BMET today. He will continue to push po fluids. I do not think this is an arrythmia. BP has been ok at home. Carotids normal 2013. Will stop metoprolol and see if this helps.

## 2013-09-21 NOTE — Patient Instructions (Signed)
Your physician wants you to follow-up in:  6 months. You will receive a reminder letter in the mail two months in advance. If you don't receive a letter, please call our office to schedule the follow-up appointment.  Your physician has recommended you make the following change in your medication:  Stop metoprolol tartrate

## 2013-09-26 ENCOUNTER — Other Ambulatory Visit: Payer: Self-pay | Admitting: Cardiovascular Disease

## 2013-09-26 ENCOUNTER — Encounter: Payer: Self-pay | Admitting: Cardiovascular Disease

## 2013-09-26 ENCOUNTER — Other Ambulatory Visit: Payer: Self-pay | Admitting: *Deleted

## 2013-09-26 MED ORDER — METOPROLOL TARTRATE 25 MG PO TABS: 25.0000 mg | ORAL_TABLET | Freq: Two times a day (BID) | ORAL | Status: DC

## 2013-10-07 ENCOUNTER — Other Ambulatory Visit: Payer: Self-pay | Admitting: Cardiovascular Disease

## 2013-11-06 ENCOUNTER — Other Ambulatory Visit: Payer: Self-pay | Admitting: Cardiovascular Disease

## 2013-12-22 ENCOUNTER — Other Ambulatory Visit: Payer: Self-pay | Admitting: Cardiovascular Disease

## 2013-12-22 ENCOUNTER — Encounter: Payer: Self-pay | Admitting: Cardiovascular Disease

## 2014-01-30 ENCOUNTER — Other Ambulatory Visit: Payer: Self-pay | Admitting: Cardiovascular Disease

## 2014-03-09 ENCOUNTER — Encounter: Payer: Self-pay | Admitting: Cardiovascular Disease

## 2014-03-09 ENCOUNTER — Other Ambulatory Visit: Payer: Self-pay | Admitting: Cardiovascular Disease

## 2014-03-25 ENCOUNTER — Ambulatory Visit: Payer: PRIVATE HEALTH INSURANCE | Admitting: Cardiovascular Disease

## 2014-04-11 ENCOUNTER — Other Ambulatory Visit: Payer: Self-pay | Admitting: Cardiovascular Disease

## 2014-04-12 ENCOUNTER — Other Ambulatory Visit: Payer: Self-pay | Admitting: *Deleted

## 2014-04-12 MED ORDER — ATORVASTATIN CALCIUM 80 MG PO TABS: ORAL_TABLET | ORAL | Status: DC

## 2014-04-15 ENCOUNTER — Ambulatory Visit: Payer: PRIVATE HEALTH INSURANCE | Admitting: Cardiovascular Disease

## 2014-05-17 ENCOUNTER — Other Ambulatory Visit: Payer: Self-pay

## 2014-06-07 ENCOUNTER — Ambulatory Visit (INDEPENDENT_AMBULATORY_CARE_PROVIDER_SITE_OTHER): Payer: PRIVATE HEALTH INSURANCE | Admitting: Cardiovascular Disease

## 2014-06-07 ENCOUNTER — Encounter: Payer: Self-pay | Admitting: Cardiovascular Disease

## 2014-06-07 VITALS — BP 110/72 | HR 71 | Ht 70.0 in | Wt 197.1 lb

## 2014-06-07 DIAGNOSIS — I2510 Atherosclerotic heart disease of native coronary artery without angina pectoris: Secondary | ICD-10-CM

## 2014-06-07 DIAGNOSIS — R002 Palpitations: Secondary | ICD-10-CM

## 2014-06-07 DIAGNOSIS — E785 Hyperlipidemia, unspecified: Secondary | ICD-10-CM

## 2014-06-07 MED ORDER — NITROGLYCERIN 0.4 MG SL SUBL: 0.4000 mg | SUBLINGUAL_TABLET | SUBLINGUAL | Status: DC | PRN

## 2014-06-07 NOTE — Progress Notes (Signed)
History of Present Illness: 58 yo male with history of CAD s/p CABG with Dr. Tyrone Sage 12/10/11 ( LIMA to mid LAD, SVG-OM1 and OM2, SVG-PDA), HLD, HTN here today for cardiac follow up. CABG in May 2013 and then readmitted June 2013 with NSTEMI. The SVG to OM system was occluded and a drug eluting stent was placed in the native Circumflex. He had a moderate pericardial effusion by echo when readmitted with dyspnea in 5/13. Admitted again 01/2012 with chest pain. Medical therapy was advanced and he was placed on isosorbide. Chest pain resolved. Echo 02/22/12: Moderate LVH, EF 65-70%, trivial pericardial effusion. Patient was seen in followup 03/10/12 by Tereso Newcomer, PA-C and continued to have difficulty with intolerance to isosorbide. This was discontinued and he was placed on amlodipine. He called in with continued chest pain and cardiac rehabilitation. He was then started on Ranexa and his chest pain was much improved. Visit here 06/21/12 and c/o dizziness and palpitations. 48 hour monitor December 2013 showed rare PVCs, PACs. He has had right leg swelling since his CABG. LE venous doppler negative for DVT December 2013. I saw him in May 2014 and he had c/o fatigue, HA, dizziness, nausea, muscle stiffness. Stress myoview June 2014 without ischemia.   He is here today for followup. He has occasional chest pains. This occurs at rest and with exerrtion. He has continues to have dizziness. No SOB. He does not feel palpitations when he is dizzy. He has been walking 3-4 days per week and lifting weights but has no pressure with this.   Primary Care Physician: [name]  Last Lipid Profile:Lipid Panel     Component Value Date/Time   CHOL 154 09/21/2013 0836   TRIG 196.0* 09/21/2013 0836   HDL 42.10 09/21/2013 0836   CHOLHDL 4 09/21/2013 0836   VLDL 39.2 09/21/2013 0836   LDLCALC 73 09/21/2013 0836     Past Medical History  Diagnosis Date  . Coronary artery disease     s/p 4V CABG 12/10/11 (LIMA to  LAD, sequential SVG to OM1 & OM2, SVG to PDA);  NSTEMI 01/13/12:  LHC 01/14/12: pLAD 40%, mLAD occluded, Dx 40%, pCFX 90%, OM1 small and occluded at the ostium, OM2 small with 99% stenosis, proximal and mid RCA 50%, mRCA occluded, S-OM1 and OM2 occluded, S-PDA okay, LIMA-LAD okay, EF 60%.  PCI: Promus DES to the pCFX    . HLD (hyperlipidemia)   . History of tobacco abuse     0.5 packs/day for 25 years, quit 2005  . Fibromyalgia   . Chronic neck pain   . Headache(784.0)   . Pericardial effusion     echo 12/29/11 small to moderate pericardial effusion primarily posteriorly and anterolaterally; echo 01/14/12: Mild LVH, EF 65%, moderate pericardial effusion circumferential to the heart with no evidence of Tamponade-similar to 12/29/11 ; trivial effusion by echo 7/13  . Cough   . Hypertension   . Pneumonia     HX OF pna  . Chest pain     Imdur added during admxn 7/13=> intol 2/2 HA (stopped): Norvasc added 8/13;  Ranexa 500 bid added 8/13 (QTc stable) => CP much improved    Past Surgical History  Procedure Laterality Date  . Coronary artery bypass graft  12/10/2011    Procedure: CORONARY ARTERY BYPASS GRAFTING (CABG);  Surgeon: Delight Ovens, MD;  Location: [facility] OR;  Service: Open Heart Surgery;  Laterality: N/A;  Times four  using endoscopically harvested right greater saphenous and left internal  mammary artery.  [name]. Appendectomy    . Mouth surgery    . Nasal sinus surgery      Current Outpatient Prescriptions  Medication Sig Dispense Refill  . amLODipine (NORVASC) 5 MG tablet TAKE 1/2 TABLET BY MOUTH EVERY DAY 30 tablet 3  . aspirin 81 MG EC tablet Take 1 tablet (81 mg total) by mouth daily.    [name]. atorvastatin (LIPITOR) 80 MG tablet TAKE 1 TABLET EVERY DAY 30 tablet 6  . clopidogrel (PLAVIX) 75 MG tablet TAKE 1 TABLET EVERY DAY 30 tablet 11  . metoprolol tartrate (LOPRESSOR) 25 MG tablet Take 1 tablet (25 mg total) by mouth 2 (two) times daily. 60 tablet 11  . RANEXA 500 MG 12 hr tablet TAKE 1  TABLET BY MOUTH TWICE A DAY 60 tablet 3  . nitroGLYCERIN (NITROSTAT) 0.4 MG SL tablet Place 1 tablet (0.4 mg total) under the tongue every 5 (five) minutes as needed for chest pain (up to 3 doses). 25 tablet 3   No current facility-administered medications for this visit.    No Known Allergies  History   Social History  . Marital Status: Legally Separated    Spouse Name: N/A    Number of Children: N/A  . Years of Education: N/A   Occupational History  . Not on file.   Social History Main Topics  . Smoking status: Former Smoker -- 0.50 packs/day for 25 years    Types: Cigarettes    Quit date: 12/07/2003  . Smokeless tobacco: Never Used  . Alcohol Use: No  . Drug Use: No  . Sexual Activity: Yes   Other Topics Concern  . Not on file   Social History Narrative    No family history on file.  Review of Systems:  As stated in the HPI and otherwise negative.   BP 110/72 mmHg  Pulse 71  Ht 5\' 10"  (1.778 m)  Wt 197 lb 1.9 oz (89.413 kg)  BMI 28.28 kg/m2  Physical Examination: General: Well developed, well nourished, NAD HEENT: OP clear, mucus membranes moist SKIN: warm, dry. No rashes. Neuro: No focal deficits Musculoskeletal: Muscle strength 5/5 all ext Psychiatric: Mood and affect normal Neck: No JVD, no carotid bruits, no thyromegaly, no lymphadenopathy. Lungs:Clear bilaterally, no wheezes, rhonci, crackles Cardiovascular: Regular rate and rhythm. No murmurs, gallops or rubs. Abdomen:Soft. Bowel sounds present. Non-tender.  Extremities: No lower extremity edema. Pulses are 2 + in the bilateral DP/PT.  Stress myoview June 2014: Stress Procedure: The patient exercised on the treadmill utilizing the Bruce Protocol for 9:00 minutes. The patient stopped due to dizziness,SOB and fatigue and denied any chest pain. Patient developed chest pressure 2/10 with exercise. Patient unable to reach target Heart rate and changed to a Lexiscan. The patient received IV Lexiscan 0.4mg   over 15 seconds. Myoview was injected at 30 seconds. Patient became very dizzy,pale,hypotensive and diaphoretic. Patient placed in trendelenburg position and 250 cc NS infused.Patient's hypotensive response resolved late in recovery.There were no significant changes with Lexiscan. Quantitative spect images were obtained after a 45 minute delay.  Stress ECG: No significant change from baseline ECG  QPS  Raw Data Images: Normal; no motion artifact; normal heart/lung ratio.  Stress Images: Normal homogeneous uptake in all areas of the myocardium.  Rest Images: Normal homogeneous uptake in all areas of the myocardium.  Subtraction (SDS): Normal  Transient Ischemic Dilatation (Normal <1.22): 1.01  Lung/Heart Ratio (Normal <0.45): 0.38  Quantitative Gated Spect Images  QGS EDV: 81 ml  QGS  ESV: 27 ml  Impression  Exercise Capacity: Good exercise capacity.  BP Response: Hypotensive blood pressure response.  Clinical Symptoms: Dyspnea and chest pain  ECG Impression: No significant ST segment change suggestive of ischemia.  Comparison with Prior Nuclear Study: No images to compare  Overall Impression: Low risk stress nuclear study Normal ECG response and normal nuclear images Chest pain and dyspnea with exercise. ? Vagal response post exercise with hypotension requiring saline.  LV Ejection Fraction: 67%. LV Wall Motion: NL LV Function; NL Wall Motion  EKG: NSR, rate 71 bpm. Non-specific T wave abnormality.   Assessment and Plan:   1. CAD: He has had no exertional chest pain but still having dizziness. Stress test without ischemia June 2014. Will continue ASA, Plavix, beta blocker, statin, Ranexa.    2. Hyperlipidemia: Continue Lipitor. Check lipids and LFTs today  3. Palpitations: Rare. Likely due to PVCs.

## 2014-06-07 NOTE — Patient Instructions (Signed)
Your physician wants you to follow-up in:  6 months. You will receive a reminder letter in the mail two months in advance. If you don't receive a letter, please call our office to schedule the follow-up appointment.   

## 2014-06-12 ENCOUNTER — Encounter: Payer: Self-pay | Admitting: Cardiovascular Disease

## 2014-07-02 ENCOUNTER — Other Ambulatory Visit: Payer: Self-pay | Admitting: Cardiovascular Disease

## 2014-07-08 ENCOUNTER — Other Ambulatory Visit: Payer: Self-pay | Admitting: Cardiovascular Disease

## 2014-07-08 ENCOUNTER — Encounter: Payer: Self-pay | Admitting: Cardiovascular Disease

## 2014-07-11 ENCOUNTER — Encounter (HOSPITAL_COMMUNITY): Payer: Self-pay | Admitting: Cardiovascular Disease

## 2014-07-18 ENCOUNTER — Encounter: Payer: Self-pay | Admitting: Cardiovascular Disease

## 2014-07-22 ENCOUNTER — Other Ambulatory Visit: Payer: Self-pay

## 2014-07-22 MED ORDER — METOPROLOL TARTRATE 25 MG PO TABS: 25.0000 mg | ORAL_TABLET | Freq: Two times a day (BID) | ORAL | Status: DC

## 2014-08-02 ENCOUNTER — Other Ambulatory Visit: Payer: Self-pay | Admitting: Cardiovascular Disease

## 2014-08-04 ENCOUNTER — Encounter: Payer: Self-pay | Admitting: Cardiovascular Disease

## 2014-08-05 ENCOUNTER — Other Ambulatory Visit: Payer: Self-pay | Admitting: Cardiovascular Disease

## 2014-10-09 ENCOUNTER — Encounter: Payer: Self-pay | Admitting: Cardiovascular Disease

## 2014-11-03 ENCOUNTER — Encounter: Payer: Self-pay | Admitting: Cardiovascular Disease

## 2014-12-05 ENCOUNTER — Encounter: Payer: Self-pay | Admitting: Cardiovascular Disease

## 2014-12-05 ENCOUNTER — Other Ambulatory Visit: Payer: Self-pay | Admitting: Interventional Cardiology

## 2014-12-06 ENCOUNTER — Other Ambulatory Visit: Payer: Self-pay | Admitting: Cardiovascular Disease

## 2014-12-16 ENCOUNTER — Other Ambulatory Visit: Payer: Self-pay | Admitting: Cardiovascular Disease

## 2014-12-16 NOTE — Telephone Encounter (Signed)
Pt needs appointment for further refills 

## 2014-12-19 ENCOUNTER — Other Ambulatory Visit: Payer: Self-pay | Admitting: Cardiovascular Disease

## 2014-12-30 ENCOUNTER — Other Ambulatory Visit: Payer: Self-pay | Admitting: Cardiovascular Disease

## 2015-01-02 ENCOUNTER — Other Ambulatory Visit: Payer: Self-pay | Admitting: Cardiovascular Disease

## 2015-01-03 ENCOUNTER — Other Ambulatory Visit: Payer: Self-pay | Admitting: Cardiovascular Disease

## 2015-01-31 ENCOUNTER — Other Ambulatory Visit: Payer: Self-pay | Admitting: Cardiovascular Disease

## 2015-02-11 ENCOUNTER — Other Ambulatory Visit: Payer: Self-pay

## 2015-02-11 MED ORDER — AMLODIPINE BESYLATE 5 MG PO TABS: 2.5000 mg | ORAL_TABLET | Freq: Every day | ORAL | Status: DC

## 2015-02-11 MED ORDER — ATORVASTATIN CALCIUM 80 MG PO TABS: 80.0000 mg | ORAL_TABLET | ORAL | Status: DC

## 2015-02-11 MED ORDER — METOPROLOL TARTRATE 25 MG PO TABS: 25.0000 mg | ORAL_TABLET | Freq: Two times a day (BID) | ORAL | Status: DC

## 2015-02-11 MED ORDER — CLOPIDOGREL BISULFATE 75 MG PO TABS: 75.0000 mg | ORAL_TABLET | Freq: Every day | ORAL | Status: DC

## 2015-02-11 MED ORDER — RANOLAZINE ER 500 MG PO TB12: 500.0000 mg | ORAL_TABLET | Freq: Two times a day (BID) | ORAL | Status: DC

## 2015-03-24 ENCOUNTER — Other Ambulatory Visit: Payer: Self-pay | Admitting: Cardiovascular Disease

## 2015-03-28 ENCOUNTER — Ambulatory Visit (INDEPENDENT_AMBULATORY_CARE_PROVIDER_SITE_OTHER): Payer: PRIVATE HEALTH INSURANCE | Admitting: Cardiology

## 2015-03-28 ENCOUNTER — Encounter: Payer: Self-pay | Admitting: Cardiology

## 2015-03-28 VITALS — BP 128/78 | HR 89 | Ht 70.0 in | Wt 191.8 lb

## 2015-03-28 DIAGNOSIS — Z951 Presence of aortocoronary bypass graft: Secondary | ICD-10-CM | POA: Diagnosis not present

## 2015-03-28 DIAGNOSIS — E785 Hyperlipidemia, unspecified: Secondary | ICD-10-CM | POA: Diagnosis not present

## 2015-03-28 DIAGNOSIS — I10 Essential (primary) hypertension: Secondary | ICD-10-CM | POA: Diagnosis not present

## 2015-03-28 DIAGNOSIS — I2510 Atherosclerotic heart disease of native coronary artery without angina pectoris: Secondary | ICD-10-CM | POA: Diagnosis not present

## 2015-03-28 DIAGNOSIS — I25119 Atherosclerotic heart disease of native coronary artery with unspecified angina pectoris: Secondary | ICD-10-CM | POA: Insufficient documentation

## 2015-03-28 DIAGNOSIS — Z9861 Coronary angioplasty status: Secondary | ICD-10-CM

## 2015-03-28 MED ORDER — CLOPIDOGREL BISULFATE 75 MG PO TABS: 75.0000 mg | ORAL_TABLET | Freq: Every day | ORAL | Status: DC

## 2015-03-28 MED ORDER — RANOLAZINE ER 500 MG PO TB12: 500.0000 mg | ORAL_TABLET | Freq: Two times a day (BID) | ORAL | Status: DC

## 2015-03-28 MED ORDER — METOPROLOL TARTRATE 25 MG PO TABS: ORAL_TABLET | ORAL | Status: DC

## 2015-03-28 MED ORDER — AMLODIPINE BESYLATE 5 MG PO TABS: 2.5000 mg | ORAL_TABLET | Freq: Every day | ORAL | Status: DC

## 2015-03-28 NOTE — Assessment & Plan Note (Signed)
NSTEMI, occluded SVG-OM1-OM2, native CFX DES placed.

## 2015-03-28 NOTE — Assessment & Plan Note (Signed)
Due for lipids and CMET 

## 2015-03-28 NOTE — Patient Instructions (Signed)
Medication Instructions:  Your physician recommends that you continue on your current medications as directed. Please refer to the Current Medication list given to you today.  Labwork: NONE  Testing/Procedures: NONE  Follow-Up: Your physician wants you to follow-up in: 6 months with Dr. [name].  You will receive a reminder letter in the mail two months in advance. If you don't receive a letter, please call our office to schedule the follow-up appointment.   Any Other Special Instructions Will Be Listed Below (If Applicable).

## 2015-03-28 NOTE — Assessment & Plan Note (Signed)
Treated

## 2015-03-28 NOTE — Assessment & Plan Note (Signed)
LIMA-LAD, SVG-OM1-OM2, SVG-PDA

## 2015-03-28 NOTE — Progress Notes (Signed)
03/28/2015 Michael Spears   February 23, 1956  [id number]  Primary Physician Leanor Rubenstein, MD Primary Cardiologist: Dr Sanjuana Kava  HPI:  59 yo male with history of CABG 12/10/11 ( LIMA to mid LAD, SVG-OM1 and OM2, SVG-PDA),  then readmitted June 2013 with NSTEMI. The SVG to OM system was occluded and a drug eluting stent was placed in the native Circumflex. Admitted again 01/2012 with chest pain. Medical therapy was advanced and he was placed on isosorbide. Echo 02/22/12: Moderate LVH, EF 65-70%, trivial pericardial effusion. Patient was seen in follow up 03/10/12 by Tereso Newcomer, PA-C and continued to have difficulty with chest pain and intolerance to isosorbide. This was discontinued and he was placed on amlodipine. He called in with continued chest pain while at cardiac rehabilitation. He was then started on Ranexa and his chest pain was much improved. Stress Myoview June 2014 without ischemia. He is here today for routine follow up. He has been doing well, no chest pain. He walks every day at work during lunch and lifts weights a couple times a week (I encouraged less weight lifting and more walking).    Current Outpatient Prescriptions  Medication Sig Dispense Refill  . amLODipine (NORVASC) 5 MG tablet Take 0.5 tablets (2.5 mg total) by mouth daily. 15 tablet 1  . aspirin 81 MG EC tablet Take 1 tablet (81 mg total) by mouth daily.    [name] atorvastatin (LIPITOR) 80 MG tablet Take 1 tablet (80 mg total) by mouth 1 day or 1 dose. 30 tablet 0  . clopidogrel (PLAVIX) 75 MG tablet Take 1 tablet (75 mg total) by mouth daily. 30 tablet 1  . metoprolol tartrate (LOPRESSOR) 25 MG tablet TAKE 1 TABLET (25 MG TOTAL) BY MOUTH 2 (TWO) TIMES DAILY. 60 tablet 0  . nitroGLYCERIN (NITROSTAT) 0.4 MG SL tablet Place 1 tablet (0.4 mg total) under the tongue every 5 (five) minutes as needed for chest pain (up to 3 doses). 25 tablet 6  . ranolazine (RANEXA) 500 MG 12 hr tablet Take 1 tablet (500 mg total) by mouth 2 (two) times  daily. 60 tablet 0   No current facility-administered medications for this visit.    No Known Allergies  Social History   Social History  . Marital Status: Legally Separated    Spouse Name: N/A  . Number of Children: N/A  . Years of Education: N/A   Occupational History  . Not on file.   Social History Main Topics  . Smoking status: Former Smoker -- 0.50 packs/day for 25 years    Types: Cigarettes    Quit date: 12/07/2003  . Smokeless tobacco: Never Used  . Alcohol Use: No  . Drug Use: No  . Sexual Activity: Yes   Other Topics Concern  . Not on file   Social History Narrative     Review of Systems: General: negative for chills, fever, night sweats or weight changes.  Cardiovascular: negative for chest pain, dyspnea on exertion, edema, orthopnea, palpitations, paroxysmal nocturnal dyspnea or shortness of breath Dermatological: negative for rash Respiratory: negative for cough or wheezing Urologic: negative for hematuria Abdominal: negative for nausea, vomiting, diarrhea, bright red blood per rectum, melena, or hematemesis Neurologic: negative for visual changes, syncope, or dizziness All other systems reviewed and are otherwise negative except as noted above.    Blood pressure 128/78, pulse 89, height 5\' 10"  (1.778 m), weight 191 lb 12.8 oz (87 kg), SpO2 96 %.  General appearance: alert, cooperative and no distress Neck:  no carotid bruit and no JVD Lungs: clear to auscultation bilaterally Heart: regular rate and rhythm Abdomen: soft, non-tender; bowel sounds normal; no masses,  no organomegaly Extremities: extremities normal, atraumatic, no cyanosis or edema Pulses: 2+ and symmetric Skin: Skin color, texture, turgor normal. No rashes or lesions Neurologic: Grossly normal   ASSESSMENT AND PLAN:   Hyperlipidemia Due for lipids and CMET  Essential hypertension Treated  S/P CABG x 04 Dec 2011 LIMA-LAD, SVG-OM1-OM2, SVG-PDA  CAD S/P CFX DES June  2013 NSTEMI, occluded SVG-OM1-OM2, native CFX DES placed.   PLAN  Same Rx, refill meds, RTC 6 months.   Michael Demma K PA-C 03/28/2015 11:59 AM

## 2015-04-11 ENCOUNTER — Other Ambulatory Visit: Payer: Self-pay | Admitting: Cardiovascular Disease

## 2015-04-28 ENCOUNTER — Other Ambulatory Visit: Payer: Self-pay | Admitting: Cardiovascular Disease

## 2015-04-28 ENCOUNTER — Other Ambulatory Visit: Payer: Self-pay | Admitting: *Deleted

## 2015-04-28 ENCOUNTER — Encounter: Payer: Self-pay | Admitting: Cardiovascular Disease

## 2015-04-28 MED ORDER — AMLODIPINE BESYLATE 5 MG PO TABS: 2.5000 mg | ORAL_TABLET | Freq: Every day | ORAL | Status: DC

## 2015-04-28 MED ORDER — RANOLAZINE ER 500 MG PO TB12: 500.0000 mg | ORAL_TABLET | Freq: Two times a day (BID) | ORAL | Status: DC

## 2015-04-28 MED ORDER — CLOPIDOGREL BISULFATE 75 MG PO TABS: 75.0000 mg | ORAL_TABLET | Freq: Every day | ORAL | Status: DC

## 2015-04-28 MED ORDER — METOPROLOL TARTRATE 25 MG PO TABS: ORAL_TABLET | ORAL | Status: DC

## 2016-01-30 ENCOUNTER — Encounter: Payer: Self-pay | Admitting: Cardiovascular Disease

## 2016-01-30 ENCOUNTER — Ambulatory Visit (INDEPENDENT_AMBULATORY_CARE_PROVIDER_SITE_OTHER): Payer: Managed Care, Other (non HMO) | Admitting: Cardiovascular Disease

## 2016-01-30 VITALS — BP 116/70 | HR 74 | Ht 70.0 in | Wt 197.0 lb

## 2016-01-30 DIAGNOSIS — I2510 Atherosclerotic heart disease of native coronary artery without angina pectoris: Secondary | ICD-10-CM

## 2016-01-30 DIAGNOSIS — E785 Hyperlipidemia, unspecified: Secondary | ICD-10-CM

## 2016-01-30 MED ORDER — ATORVASTATIN CALCIUM 80 MG PO TABS: 80.0000 mg | ORAL_TABLET | Freq: Every day | ORAL | Status: DC

## 2016-01-30 MED ORDER — METOPROLOL TARTRATE 25 MG PO TABS: ORAL_TABLET | ORAL | Status: DC

## 2016-01-30 MED ORDER — AMLODIPINE BESYLATE 5 MG PO TABS: 2.5000 mg | ORAL_TABLET | Freq: Every day | ORAL | Status: DC

## 2016-01-30 MED ORDER — NITROGLYCERIN 0.4 MG SL SUBL: 0.4000 mg | SUBLINGUAL_TABLET | SUBLINGUAL | Status: DC | PRN

## 2016-01-30 MED ORDER — CLOPIDOGREL BISULFATE 75 MG PO TABS
75.0000 mg | ORAL_TABLET | Freq: Every day | ORAL | Status: DC
Start: 2016-01-30 — End: 2017-02-27

## 2016-01-30 MED ORDER — RANOLAZINE ER 500 MG PO TB12
500.0000 mg | ORAL_TABLET | Freq: Two times a day (BID) | ORAL | Status: DC
Start: 2016-01-30 — End: 2017-02-15

## 2016-01-30 NOTE — Progress Notes (Signed)
Chief Complaint  Patient presents with  . Coronary Artery Disease   History of Present Illness: 60 yo male with history of CAD s/p CABG with Dr. Tyrone SageGerhardt 12/10/11 ( LIMA to mid LAD, SVG-OM1 and OM2, SVG-PDA), HLD, HTN here today for cardiac follow up. CABG in May 2013 and then readmitted June 2013 with NSTEMI. The SVG to OM system was occluded and a drug eluting stent was placed in the native Circumflex. Echo July 2013 with moderate LVH, LVEF 65-70%, trivial pericardial effusion. Patient was seen in followup 03/10/12 by Tereso NewcomerScott Weaver, PA-C and continued to have difficulty with intolerance to isosorbide. This was discontinued and he was placed on amlodipine. He called in with continued chest pain and cardiac rehabilitation. He was then started on Ranexa and his chest pain was much improved. Visit here 06/21/12 and c/o dizziness and palpitations. 48 hour monitor December 2013 showed rare PVCs, PACs. He has had right leg swelling since his CABG. LE venous doppler negative for DVT December 2013. I saw him in May 2014 and he had c/o fatigue, HA, dizziness, nausea, muscle stiffness. Stress myoview June 2014 without ischemia.   He is here today for followup. He has occasional chest tightness with moderate exertion. He continues to have dizziness when first standing. This has been present for years. No SOB. He has been walking 3-4 days per week and lifting weights but has no pressure with this.   Primary Care Physician: Leanor RubensteinSUN,VYVYAN Y, MD  Past Medical History  Diagnosis Date  . Coronary artery disease     s/p 4V CABG 12/10/11 (LIMA to LAD, sequential SVG to OM1 & OM2, SVG to PDA);  NSTEMI 01/13/12:  LHC 01/14/12: pLAD 40%, mLAD occluded, Dx 40%, pCFX 90%, OM1 small and occluded at the ostium, OM2 small with 99% stenosis, proximal and mid RCA 50%, mRCA occluded, S-OM1 and OM2 occluded, S-PDA okay, LIMA-LAD okay, EF 60%.  PCI: Promus DES to the pCFX    . HLD (hyperlipidemia)   . History of tobacco abuse     0.5  packs/day for 25 years, quit 2005  . Fibromyalgia   . Chronic neck pain   . Headache(784.0)   . Pericardial effusion     echo 12/29/11 small to moderate pericardial effusion primarily posteriorly and anterolaterally; echo 01/14/12: Mild LVH, EF 65%, moderate pericardial effusion circumferential to the heart with no evidence of Tamponade-similar to 12/29/11 ; trivial effusion by echo 7/13  . Cough   . Hypertension   . Pneumonia     HX OF pna  . Chest pain     Imdur added during admxn 7/13=> intol 2/2 HA (stopped): Norvasc added 8/13;  Ranexa 500 bid added 8/13 (QTc stable) => CP much improved    Past Surgical History  Procedure Laterality Date  . Coronary artery bypass graft  12/10/2011    Procedure: CORONARY ARTERY BYPASS GRAFTING (CABG);  Surgeon: Delight OvensEdward B Gerhardt, MD;  [facility];  Service: Open Heart Surgery;  Laterality: N/A;  Times four  using endoscopically harvested right greater saphenous and left internal mammary artery.  [name]. Appendectomy    . Mouth surgery    . Nasal sinus surgery    . Left heart catheterization with coronary angiogram N/A 12/07/2011    Procedure: LEFT HEART CATHETERIZATION WITH CORONARY ANGIOGRAM;  Surgeon: Kathleene Hazelhristopher D McAlhany, MD;  [facility];  Service: Cardiovascular;  Laterality: N/A;  . Left heart catheterization with coronary angiogram N/A 01/14/2012    Procedure: LEFT HEART CATHETERIZATION  WITH CORONARY ANGIOGRAM;  Surgeon: [name], MD;  Location: [facility] CATH LAB;  Service: Cardiovascular;  Laterality: N/A;  . Percutaneous coronary stent intervention (pci-s)  01/14/2012    Procedure: PERCUTANEOUS CORONARY STENT INTERVENTION (PCI-S);  Surgeon: [name], MD;  [facility] LAB;  Service: Cardiovascular;;    Current Outpatient Prescriptions  Medication Sig Dispense Refill  . amLODipine (NORVASC) 5 MG tablet Take 0.5 tablets (2.5 mg total) by mouth daily. 15 tablet 11  . aspirin 81 MG EC tablet Take 1 tablet (81 mg  total) by mouth daily.    [name] atorvastatin (LIPITOR) 80 MG tablet Take 1 tablet (80 mg total) by mouth daily. 30 tablet 11  . clopidogrel (PLAVIX) 75 MG tablet Take 1 tablet (75 mg total) by mouth daily. 30 tablet 11  . metoprolol tartrate (LOPRESSOR) 25 MG tablet TAKE 1 TABLET (25 MG TOTAL) BY MOUTH 2 (TWO) TIMES DAILY. 60 tablet 11  . nitroGLYCERIN (NITROSTAT) 0.4 MG SL tablet Place 1 tablet (0.4 mg total) under the tongue every 5 (five) minutes as needed for chest pain (up to 3 doses). 25 tablet 6  . ranolazine (RANEXA) 500 MG 12 hr tablet Take 1 tablet (500 mg total) by mouth 2 (two) times daily. 60 tablet 11   No current facility-administered medications for this visit.    No Known Allergies  Social History   Social History  . Marital Status: Legally Separated    Spouse Name: N/A  . Number of Children: N/A  . Years of Education: N/A   Occupational History  . Not on file.   Social History Main Topics  . Smoking status: Former Smoker -- 0.50 packs/day for 25 years    Types: Cigarettes    Quit date: 12/07/2003  . Smokeless tobacco: Never Used  . Alcohol Use: No  . Drug Use: No  . Sexual Activity: Yes   Other Topics Concern  . Not on file   Social History Narrative    Family History  Problem Relation Age of Onset  . Hypertension Mother   . Hypertension Father   . Crohn's disease Father     Review of Systems:  As stated in the HPI and otherwise negative.   BP 116/70 mmHg  Pulse 74  Ht  (1.778 m)  Wt 197 lb (89.359 kg)  BMI 28.27 kg/m2  Physical Examination: General: Well developed, well nourished, NAD HEENT: OP clear, mucus membranes moist SKIN: warm, dry. No rashes. Neuro: No focal deficits Musculoskeletal: Muscle strength 5/5 all ext Psychiatric: Mood and affect normal Neck: No JVD, no carotid bruits, no thyromegaly, no lymphadenopathy. Lungs:Clear bilaterally, no wheezes, rhonci, crackles Cardiovascular: Regular rate and rhythm. No murmurs, gallops  or rubs. Abdomen:Soft. Bowel sounds present. Non-tender.  Extremities: No lower extremity edema. Pulses are 2 + in the bilateral DP/PT.  Stress myoview June 2014: Stress Procedure: The patient exercised on the treadmill utilizing the Bruce Protocol for 9:00 minutes. The patient stopped due to dizziness,SOB and fatigue and denied any chest pain. Patient developed chest pressure 2/10 with exercise. Patient unable to reach target Heart rate and changed to a Lexiscan. The patient received IV Lexiscan 0.4mg  over 15 seconds. Myoview was injected at 30 seconds. Patient became very dizzy,pale,hypotensive and diaphoretic. Patient placed in trendelenburg position and 250 cc NS infused.Patient's hypotensive response resolved late in recovery.There were no significant changes with Lexiscan. Quantitative spect images were obtained after a 45 minute delay.  Stress ECG: No significant change from baseline ECG  QPS  Raw Data Images: Normal; no motion artifact; normal heart/lung ratio.  Stress Images: Normal homogeneous uptake in all areas of the myocardium.  Rest Images: Normal homogeneous uptake in all areas of the myocardium.  Subtraction (SDS): Normal  Transient Ischemic Dilatation (Normal <1.22): 1.01  Lung/Heart Ratio (Normal <0.45): 0.38  Quantitative Gated Spect Images  QGS EDV: 81 ml  QGS ESV: 27 ml  Impression  Exercise Capacity: Good exercise capacity.  BP Response: Hypotensive blood pressure response.  Clinical Symptoms: Dyspnea and chest pain  ECG Impression: No significant ST segment change suggestive of ischemia.  Comparison with Prior Nuclear Study: No images to compare  Overall Impression: Low risk stress nuclear study Normal ECG response and normal nuclear images Chest pain and dyspnea with exercise. ? Vagal response post exercise with hypotension requiring saline.  LV Ejection Fraction: 67%. LV Wall Motion: NL LV Function; NL Wall Motion  EKG:  EKG is ordered today. The ekg ordered today  demonstrates NSR, rate 74 bpm. LVH. Old inferior infarct.   Recent Labs: No results found for requested labs within last 365 days.   Lipid Panel    Component Value Date/Time   CHOL 154 09/21/2013 0836   TRIG 196.0* 09/21/2013 0836   HDL 42.10 09/21/2013 0836   CHOLHDL 4 09/21/2013 0836   VLDL 39.2 09/21/2013 0836   LDLCALC 73 09/21/2013 0836     Wt Readings from Last 3 Encounters:  01/30/16 197 lb (89.359 kg)  03/28/15 191 lb 12.8 oz (87 kg)  06/07/14 197 lb 1.9 oz (89.413 kg)     Other studies Reviewed: Additional studies/ records that were reviewed today include: . Review of the above records demonstrates:    Assessment and Plan:   1. CAD: He has had no exertional chest pain but still having dizziness. Stress test without ischemia June 2014. Will continue ASA, Plavix, beta blocker, statin, Ranexa.    2. Hyperlipidemia: Continue Lipitor. Check lipids and LFTs today  3. Palpitations: Rare. Likely due to PVCs.   Current medicines are reviewed at length with the patient today.  The patient does not have concerns regarding medicines.  The following changes have been made:  no change  Labs/ tests ordered today include:   Orders Placed This Encounter  Procedures  . EKG 12-Lead     Disposition:   FU with me in 12  months   Signed, Verne Carrowhristopher McAlhany, MD 01/30/2016 4:50 PM    [facility] [address], [place], [place]  [address] Phone: [phone]; Fax: [phone]

## 2016-01-30 NOTE — Patient Instructions (Signed)
Medication Instructions:  Your physician recommends that you continue on your current medications as directed. Please refer to the Current Medication list given to you today.   Labwork: None   Testing/Procedures: None   Follow-Up: Your physician wants you to follow-up in: 1 year with Dr McAlhany. (June 2018).  You will receive a reminder letter in the mail two months in advance. If you don't receive a letter, please call our office to schedule the follow-up appointment.        If you need a refill on your cardiac medications before your next appointment, please call your pharmacy.   

## 2016-05-01 ENCOUNTER — Other Ambulatory Visit: Payer: Self-pay | Admitting: Cardiovascular Disease

## 2016-05-03 ENCOUNTER — Encounter: Payer: Self-pay | Admitting: Cardiovascular Disease

## 2016-05-03 ENCOUNTER — Other Ambulatory Visit: Payer: Self-pay | Admitting: Cardiovascular Disease

## 2016-05-04 ENCOUNTER — Other Ambulatory Visit: Payer: Self-pay | Admitting: *Deleted

## 2016-05-04 DIAGNOSIS — E785 Hyperlipidemia, unspecified: Secondary | ICD-10-CM

## 2016-05-04 DIAGNOSIS — I2510 Atherosclerotic heart disease of native coronary artery without angina pectoris: Secondary | ICD-10-CM

## 2016-05-04 MED ORDER — ATORVASTATIN CALCIUM 80 MG PO TABS
80.0000 mg | ORAL_TABLET | Freq: Every day | ORAL | 2 refills | Status: DC
Start: 2016-05-04 — End: 2016-11-17

## 2016-07-12 ENCOUNTER — Other Ambulatory Visit: Payer: Self-pay | Admitting: Cardiovascular Disease

## 2016-07-26 ENCOUNTER — Other Ambulatory Visit: Payer: Self-pay | Admitting: Cardiovascular Disease

## 2016-07-27 NOTE — Telephone Encounter (Signed)
Medication Detail    Disp Refills Start End   clopidogrel (PLAVIX) 75 MG tablet 30 tablet 11 01/30/2016    Sig - Route: Take 1 tablet (75 mg total) by mouth daily. - Oral   E-Prescribing Status: Receipt confirmed by pharmacy (01/30/2016 4:46 PM EDT)   Associated Diagnoses   Coronary artery disease involving native coronary artery of native heart without angina pectoris - Primary     Hyperlipidemia     Pharmacy   [id number]- [place], [place] - [address]

## 2016-08-01 ENCOUNTER — Other Ambulatory Visit: Payer: Self-pay | Admitting: Cardiovascular Disease

## 2016-08-03 NOTE — Telephone Encounter (Signed)
Medication Detail    Disp Refills Start End   clopidogrel (PLAVIX) 75 MG tablet 30 tablet 11 01/30/2016    Sig - Route: Take 1 tablet (75 mg total) by mouth daily. - Oral   E-Prescribing Status: Receipt confirmed by pharmacy (01/30/2016 4:46 PM EDT)   Associated Diagnoses   Coronary artery disease involving native coronary artery of native heart without angina pectoris - Primary     Hyperlipidemia     Pharmacy   [id number] - [place], [place] - [address]    

## 2016-10-14 ENCOUNTER — Encounter: Payer: Self-pay | Admitting: Cardiology

## 2016-10-14 ENCOUNTER — Telehealth: Payer: Self-pay | Admitting: Cardiovascular Disease

## 2016-10-14 NOTE — Telephone Encounter (Signed)
New message   Pt c/o BP issue: STAT if pt c/o blurred vision, one-sided weakness or slurred speech  1. What are your last 5 BP readings? 143/83 153/89   2. Are you having any other symptoms (ex. Dizziness, headache, blurred vision, passed out)? Pt states he is a little dizzy or lightheaded  3. What is your BP issue? Pt states that his bp has been elevated.   appt scheduled for pt with Robbie LisBrittainy Simmons on 4/18 230pm.

## 2016-10-14 NOTE — Telephone Encounter (Signed)
Left message to call back  

## 2016-10-26 ENCOUNTER — Encounter: Payer: Self-pay | Admitting: Cardiology

## 2016-11-17 ENCOUNTER — Ambulatory Visit (INDEPENDENT_AMBULATORY_CARE_PROVIDER_SITE_OTHER): Payer: Managed Care, Other (non HMO) | Admitting: Cardiology

## 2016-11-17 ENCOUNTER — Ambulatory Visit: Payer: Managed Care, Other (non HMO) | Admitting: Cardiology

## 2016-11-17 ENCOUNTER — Encounter: Payer: Self-pay | Admitting: Cardiology

## 2016-11-17 VITALS — BP 132/74 | HR 70 | Ht 70.0 in | Wt 195.0 lb

## 2016-11-17 DIAGNOSIS — R5383 Other fatigue: Secondary | ICD-10-CM | POA: Diagnosis not present

## 2016-11-17 DIAGNOSIS — I2510 Atherosclerotic heart disease of native coronary artery without angina pectoris: Secondary | ICD-10-CM | POA: Diagnosis not present

## 2016-11-17 DIAGNOSIS — R0609 Other forms of dyspnea: Secondary | ICD-10-CM | POA: Diagnosis not present

## 2016-11-17 DIAGNOSIS — E785 Hyperlipidemia, unspecified: Secondary | ICD-10-CM

## 2016-11-17 MED ORDER — AMLODIPINE BESYLATE 2.5 MG PO TABS: 2.5000 mg | ORAL_TABLET | Freq: Every day | ORAL | 0 refills | Status: DC

## 2016-11-17 MED ORDER — ATORVASTATIN CALCIUM 80 MG PO TABS: 80.0000 mg | ORAL_TABLET | Freq: Every day | ORAL | 0 refills | Status: DC

## 2016-11-17 NOTE — Patient Instructions (Addendum)
Medication Instructions:    Your physician recommends that you continue on your current medications as directed. Please refer to the Current Medication list given to you today.    If you need a refill on your cardiac medications before your next appointment, please call your pharmacy.  Labwork: NONE ORDERED  TODAY    Testing/Procedures: Your physician has requested that you have a lexiscan myoview. For further information please visit [email or web address] Please follow instruction sheet, as given.  Your physician has requested that you have an echocardiogram. Echocardiography is a painless test that uses sound waves to create images of your heart. It provides your doctor with information about the size and shape of your heart and how well your heart's chambers and valves are working. This procedure takes approximately one hour. There are no restrictions for this procedure.     Follow-Up:  WITH SIMMONS IN 3 WEEKS WITH SIMMONS    Any Other Special Instructions Will Be Listed Below (If Applicable).

## 2016-11-17 NOTE — Progress Notes (Signed)
11/17/2016 Doristine Church Larina Bras   14-May-1956  [id number]  Primary Physician Leanor Rubenstein, MD Primary Cardiologist: Dr. [name]   Reason for Visit/CC: Fatigue and Exertional Dyspnea  HPI:  Michael Spears is a 61 y.o. male , followed by Dr. [name], who presents to clinic with complaint of fatigue and exertional dyspnea.   He has a history of CAD s/p CABG with Dr. Tyrone Sage 12/10/11 ( LIMA to mid LAD, SVG-OM1 and OM2, SVG-PDA), HLD, HTN here today for cardiac follow up. CABG in May 2013 and then readmitted June 2013 with NSTEMI. The SVG to OM system was occluded and a drug eluting stent was placed in the native Circumflex. Echo July 2013 with moderate LVH, LVEF 65-70%, trivial pericardial effusion. Patient was seen in followup 03/10/12 by Tereso Newcomer, PA-C and continued to have difficulty with intolerance to isosorbide. This was discontinued and he was placed on amlodipine. He called in with continued chest pain at cardiac rehabilitation. He was then started on Ranexa and his chest pain was much improved. Also with a h/o palpitations and dizzinness. 48 hour monitor December 2013 showed rare PVCs, PACs. He has had right leg swelling since his CABG. LE venous doppler negative for DVT December 2013.His last stress myoview June 2014 was negative for. He was last seen by Dr. [name] 01/2016 and was stable from a cardiac standpoint.  He now complains of unexplained fatigue and mild exertional dyspnea over the last several weeks. He gets occasional SSCP if he over exerts himself but denies resting symptoms. The discomfort has not been severe enough to cause him to use SL NTG. He also reports unexplained dry cough over the past 2 months. No mucus production. He denies orthopnea, PND and LEE. He is not on an ACE-I.  He reports full medication compliance. BP is well controlled. EKG shows NSR with new RBBB.     No outpatient prescriptions have been marked as taking for the 11/17/16 encounter (Office Visit) with Allayne Butcher, PA-C.   No Known Allergies Past Medical History:  Diagnosis Date  . Chest pain    Imdur added during admxn 7/13=> intol 2/2 HA (stopped): Norvasc added 8/13;  Ranexa 500 bid added 8/13 (QTc stable) => CP much improved  . Chronic neck pain   . Coronary artery disease    s/p 4V CABG 12/10/11 (LIMA to LAD, sequential SVG to OM1 & OM2, SVG to PDA);  NSTEMI 01/13/12:  LHC 01/14/12: pLAD 40%, mLAD occluded, Dx 40%, pCFX 90%, OM1 small and occluded at the ostium, OM2 small with 99% stenosis, proximal and mid RCA 50%, mRCA occluded, S-OM1 and OM2 occluded, S-PDA okay, LIMA-LAD okay, EF 60%.  PCI: Promus DES to the pCFX    . Cough   . Fibromyalgia   . Headache(784.0)   . History of tobacco abuse    0.5 packs/day for 25 years, quit 2005  . HLD (hyperlipidemia)   . Hypertension   . Pericardial effusion    echo 12/29/11 small to moderate pericardial effusion primarily posteriorly and anterolaterally; echo 01/14/12: Mild LVH, EF 65%, moderate pericardial effusion circumferential to the heart with no evidence of Tamponade-similar to 12/29/11 ; trivial effusion by echo 7/13  . Pneumonia    HX OF pna   Family History  Problem Relation Age of Onset  . Hypertension Mother   . Hypertension Father   . Crohn's disease Father    Past Surgical History:  Procedure Laterality Date  . APPENDECTOMY    . CORONARY  ARTERY BYPASS GRAFT  12/10/2011   Procedure: CORONARY ARTERY BYPASS GRAFTING (CABG);  Surgeon: Delight Ovens, MD;  [facility];  Service: Open Heart Surgery;  Laterality: N/A;  Times four  using endoscopically harvested right greater saphenous and left internal mammary artery.  [name] LEFT HEART CATHETERIZATION WITH CORONARY ANGIOGRAM N/A 12/07/2011   Procedure: LEFT HEART CATHETERIZATION WITH CORONARY ANGIOGRAM;  Surgeon: [name], MD;  Location: [facility] CATH LAB;  Service: Cardiovascular;  Laterality: N/A;  . LEFT HEART CATHETERIZATION WITH CORONARY ANGIOGRAM N/A 01/14/2012    Procedure: LEFT HEART CATHETERIZATION WITH CORONARY ANGIOGRAM;  Surgeon: [name], MD;  Location: [facility] CATH LAB;  Service: Cardiovascular;  Laterality: N/A;  . MOUTH SURGERY    . NASAL SINUS SURGERY    . PERCUTANEOUS CORONARY STENT INTERVENTION (PCI-S)  01/14/2012   Procedure: PERCUTANEOUS CORONARY STENT INTERVENTION (PCI-S);  Surgeon: [name], MD;  Location[facility] C[facility] LAB;  Service: Cardiovascular;;   Social History   Social History  . Marital status: Legally Separated    Spouse name: N/A  . Number of children: N/A  . Years of education: N/A   Occupational History  . Not on file.   Social History Main Topics  . Smoking status: Former Smoker    Packs/day: 0.50    Years: 25.00    Types: Cigarettes    Quit date: 12/07/2003  . Smokeless tobacco: Never Used  . Alcohol use No  . Drug use: No  . Sexual activity: Yes   Other Topics Concern  . Not on file   Social History Narrative  . No narrative on file     Review of Systems: General: negative for chills, fever, night sweats or weight changes.  Cardiovascular: negative for chest pain, dyspnea on exertion, edema, orthopnea, palpitations, paroxysmal nocturnal dyspnea or shortness of breath Dermatological: negative for rash Respiratory: negative for cough or wheezing Urologic: negative for hematuria Abdominal: negative for nausea, vomiting, diarrhea, bright red blood per rectum, melena, or hematemesis Neurologic: negative for visual changes, syncope, or dizziness All other systems reviewed and are otherwise negative except as noted above.   Physical Exam:  Blood pressure 132/74, pulse 70, height  (1.778 m), weight 195 lb (88.5 kg).  General appearance: alert, cooperative and no distress Neck: no carotid bruit and no JVD Lungs: clear to auscultation bilaterally Heart: regular rate and rhythm, S1, S2 normal, no murmur, click, rub or gallop Extremities: extremities normal, atraumatic, no cyanosis or  edema Pulses: 2+ and symmetric Skin: Skin color, texture, turgor normal. No rashes or lesions Neurologic: Grossly normal  EKG NSR with new RBBB -- personally reviewed   ASSESSMENT AND PLAN:   61 year old male with known coronary artery disease status post CABG surgery in 2013 with early graft failure 1 month after his bypass surgery (occluded saphenous vein graft to the OM>> PCI + DES to native LCx), presenting with unexplained fatigue and exertional dyspnea. Also with new RBBB on EKG. Given his history, symptomology and abnormal EKG with new bundle branch block, I will order a NST to r/o coronary ischemia. Given his fatigue, dyspnea and dry chronic cough with prior h/o mild pericardial effusion, we will also check a 2D echo. His BP is controlled and he is on ASA, Plavix, a statin, BB, CCB and Ranexa (intolerant to nitrates). We will continue medical therapy. F/u in 1-2 weeks after NST and echo.     Brittainy Delmer Islam, M[facility] 11/17/2016 5:17 PM

## 2016-11-18 NOTE — Telephone Encounter (Signed)
Pt saw Robbie Lis, [place] on 11/17/16

## 2016-12-06 ENCOUNTER — Telehealth (HOSPITAL_COMMUNITY): Payer: Self-pay | Admitting: *Deleted

## 2016-12-06 NOTE — Telephone Encounter (Signed)
Left message on voicemail in reference to upcoming appointment scheduled for 12/08/16. Phone number given for a call back so details instructions can be given. Michael Spears W   

## 2016-12-07 ENCOUNTER — Telehealth (HOSPITAL_COMMUNITY): Payer: Self-pay | Admitting: *Deleted

## 2016-12-07 NOTE — Telephone Encounter (Signed)
Patient given detailed instructions per Myocardial Perfusion Study Information Sheet for the test on 12/08/16 at 0945. Patient notified to arrive 15 minutes early and that it is imperative to arrive on time for appointment to keep from having the test rescheduled.  If you need to cancel or reschedule your appointment, please call the office within 24 hours of your appointment. Failure to do so may result in a cancellation of your appointment, and a $50 no show fee. Patient verbalized understanding.Melvyn NovasAnne Young,CNMT

## 2016-12-07 NOTE — Telephone Encounter (Signed)
Left message on voicemail in reference to upcoming appointment scheduled for 12/08/16. Phone number given for a call back so details instructions can be given. Michael Spears W   

## 2016-12-08 ENCOUNTER — Ambulatory Visit (HOSPITAL_COMMUNITY): Payer: Managed Care, Other (non HMO) | Attending: Cardiovascular Disease

## 2016-12-08 ENCOUNTER — Ambulatory Visit (HOSPITAL_BASED_OUTPATIENT_CLINIC_OR_DEPARTMENT_OTHER): Payer: Managed Care, Other (non HMO)

## 2016-12-08 ENCOUNTER — Other Ambulatory Visit: Payer: Self-pay

## 2016-12-08 DIAGNOSIS — E785 Hyperlipidemia, unspecified: Secondary | ICD-10-CM | POA: Insufficient documentation

## 2016-12-08 DIAGNOSIS — R42 Dizziness and giddiness: Secondary | ICD-10-CM | POA: Diagnosis not present

## 2016-12-08 DIAGNOSIS — I252 Old myocardial infarction: Secondary | ICD-10-CM | POA: Diagnosis not present

## 2016-12-08 DIAGNOSIS — Z951 Presence of aortocoronary bypass graft: Secondary | ICD-10-CM | POA: Diagnosis not present

## 2016-12-08 DIAGNOSIS — R5383 Other fatigue: Secondary | ICD-10-CM

## 2016-12-08 DIAGNOSIS — R0609 Other forms of dyspnea: Secondary | ICD-10-CM | POA: Insufficient documentation

## 2016-12-08 DIAGNOSIS — R079 Chest pain, unspecified: Secondary | ICD-10-CM | POA: Diagnosis not present

## 2016-12-08 DIAGNOSIS — I2510 Atherosclerotic heart disease of native coronary artery without angina pectoris: Secondary | ICD-10-CM | POA: Diagnosis not present

## 2016-12-08 DIAGNOSIS — I10 Essential (primary) hypertension: Secondary | ICD-10-CM | POA: Insufficient documentation

## 2016-12-08 LAB — MYOCARDIAL PERFUSION IMAGING
CHL CUP NUCLEAR SDS: 2
CHL CUP RESTING HR STRESS: 63 {beats}/min
CSEPPHR: 89 {beats}/min
LV dias vol: 79 mL (ref 62–150)
LVSYSVOL: 26 mL
RATE: 0.36
SRS: 3
SSS: 5
TID: 0.9

## 2016-12-08 MED ORDER — TECHNETIUM TC 99M TETROFOSMIN IV KIT
10.8000 | PACK | Freq: Once | INTRAVENOUS | Status: AC | PRN
  Administered 2016-12-08: 10.8 via INTRAVENOUS
  Filled 2016-12-08: qty 11

## 2016-12-08 MED ORDER — TECHNETIUM TC 99M TETROFOSMIN IV KIT
32.8000 | PACK | Freq: Once | INTRAVENOUS | Status: AC | PRN
  Administered 2016-12-08: 32.8 via INTRAVENOUS
  Filled 2016-12-08: qty 33

## 2016-12-08 MED ORDER — REGADENOSON 0.4 MG/5ML IV SOLN
0.4000 mg | Freq: Once | INTRAVENOUS | Status: AC
  Administered 2016-12-08: 0.4 mg via INTRAVENOUS

## 2016-12-13 ENCOUNTER — Ambulatory Visit (INDEPENDENT_AMBULATORY_CARE_PROVIDER_SITE_OTHER): Payer: Managed Care, Other (non HMO) | Admitting: Cardiology

## 2016-12-13 ENCOUNTER — Encounter: Payer: Self-pay | Admitting: Cardiology

## 2016-12-13 VITALS — BP 128/74 | HR 77 | Resp 16 | Ht 70.0 in | Wt 196.2 lb

## 2016-12-13 DIAGNOSIS — I208 Other forms of angina pectoris: Secondary | ICD-10-CM | POA: Diagnosis not present

## 2016-12-13 NOTE — Patient Instructions (Addendum)
Medication Instructions:   Your physician recommends that you continue on your current medications as directed. Please refer to the Current Medication list given to you today.   If you need a refill on your cardiac medications before your next appointment, please call your pharmacy.  Labwork: NONE ORDERED  TODAY    Testing/Procedures: NONE ORDERED  TODAY    Follow-Up: Your physician wants you to follow-up in:  IN 6   MONTHS WITH DR  Camie Patience will receive a reminder letter in the mail two months in advance. If you don't receive a letter, please call our office to schedule the follow-up appointment.  '   Any Other Special Instructions Will Be Listed Below (If Applicable).                                                                                                                                                   Angina Pectoris Angina pectoris, often called angina, is extreme discomfort in the chest, neck, or arm. This is caused by a lack of blood in the middle and thickest layer of the heart wall (myocardium). There are four types of angina:  Stable angina. Stable angina usually occurs in episodes of predictable frequency and duration. It is usually brought on by physical activity, stress, or excitement. Stable angina usually lasts a few minutes and can often be relieved by a medicine that you place under your tongue. This medicine is called sublingual nitroglycerin.  Unstable angina. Unstable angina can occur even when you are doing little or no physical activity. It can even occur while you are sleeping or when you are at rest. It can suddenly increase in severity or frequency. It may not be relieved by sublingual nitroglycerin, and it can last up to 30 minutes.  Microvascular angina. This type of angina is caused by a disorder of tiny blood vessels called arterioles. Microvascular angina is more common in women. The pain may be more severe and last longer than other types  of angina pectoris.  Prinzmetal or variant angina. This type of angina pectoris is rare and usually occurs when you are doing little or no physical activity. It especially occurs in the early morning hours. What are the causes? Atherosclerosis is the cause of angina. This is the buildup of fat and cholesterol (plaque) on the inside of the arteries. Over time, the plaque may narrow or block the artery, and this will lessen blood flow to the heart. Plaque can also become weak and break off within a coronary artery to form a clot and cause a sudden blockage. What increases the risk? Risk factors common to both men and women include:  High cholesterol levels.  High blood pressure (hypertension).  Tobacco use.  Diabetes.  Family history of angina.  Obesity.  Lack of exercise.  A diet high in saturated fats. Women  are at greater risk for angina if they are:  Over age 97.  Postmenopausal. What are the signs or symptoms? Many people do not experience any symptoms during the early stages of angina. As the condition progresses, symptoms common to both men and women may include:  Chest pain.  The pain can be described as a crushing or squeezing in the chest, or a tightness, pressure, fullness, or heaviness in the chest.  The pain can last more than a few minutes, or it can stop and recur.  Pain in the arms, neck, jaw, or back.  Unexplained heartburn or indigestion.  Shortness of breath.  Nausea.  Sudden cold sweats.  Sudden light-headedness. Many women have chest discomfort and some of the other symptoms. However, women often have different (atypical) symptoms, such as:  Fatigue.  Unexplained feelings of nervousness or anxiety.  Unexplained weakness.  Dizziness or fainting. Sometimes, women may have angina without any symptoms. How is this diagnosed? Tests to diagnose angina may include:  ECG (electrocardiogram).  Exercise stress test. This looks for signs of  blockage when the heart is being exercised.  Pharmacologic stress test. This test looks for signs of blockage when the heart is being stressed with a medicine.  Blood tests.  Coronary angiogram. This is a procedure to look at the coronary arteries to see if there is any blockage. How is this treated? The treatment of angina may include the following:  Healthy behavioral changes to reduce or control risk factors.  Medicine.  Coronary stenting.A stent helps to keep an artery open.  Coronary angioplasty. This procedure widens a narrowed or blocked artery.  Coronary arterybypass surgery. This will allow your blood to pass the blockage (bypass) to reach your heart. Follow these instructions at home:  Take medicines only as directed by your health care provider.  Do not take the following medicines unless your health care provider approves:  Nonsteroidal anti-inflammatory drugs (NSAIDs), such as ibuprofen, naproxen, or celecoxib.  Vitamin supplements that contain vitamin A, vitamin E, or both.  Hormone replacement therapy that contains estrogen with or without progestin.  Manage other health conditions such as hypertension and diabetes as directed by your health care provider.  Follow a heart-healthy diet. A dietitian can help to educate you about healthy food options and changes.  Use healthy cooking methods such as roasting, grilling, broiling, baking, poaching, steaming, or stir-frying. Talk to a dietitian to learn more about healthy cooking methods.  Follow an exercise program approved by your health care provider.  Maintain a healthy weight. Lose weight as approved by your health care provider.  Plan rest periods when fatigued.  Learn to manage stress.  Do not use any tobacco products, including cigarettes, chewing tobacco, or electronic cigarettes. If you need help quitting, ask your health care provider.  If you drink alcohol, and your health care provider approves,  limit your alcohol intake to no more than 1 drink per day. One drink equals 12 ounces of beer, 5 ounces of wine, or 1 ounces of hard liquor.  Stop illegal drug use.  Keep all follow-up visits as directed by your health care provider. This is important. Get help right away if:  You have pain in your chest, neck, arm, jaw, stomach, or back that lasts more than a few minutes, is recurring, or is unrelieved by taking sublingualnitroglycerin.  You have profuse sweating without cause.  You have unexplained:  Heartburn or indigestion.  Shortness of breath or difficulty breathing.  Nausea or vomiting.  Fatigue.  Feelings of nervousness or anxiety.  Weakness.  Diarrhea.  You have sudden light-headedness or dizziness.  You faint. These symptoms may represent a serious problem that is an emergency. Do not wait to see if the symptoms will go away. Get medical help right away. Call your local emergency services (911 in the [place] Do not drive yourself to the hospital. This information is not intended to replace advice given to you by your health care provider. Make sure you discuss any questions you have with your health care provider. Document Released: 07/19/2005 Document Revised: 12/31/2015 Document Reviewed: 11/20/2013 Elsevier Interactive Patient Education  2017 [employer]

## 2016-12-13 NOTE — Progress Notes (Signed)
12/13/2016 Michael Spears   [birth year]  [id number]  Primary Physician [name], MD Primary Cardiologist: Dr. [name]   Reason for Visit/CC: Fatigue and Exertional Dyspnea  HPI: Michael Spears is a 60 y.o. male , followed by Dr. [name], who presents to clinic for 3 week f/u for repeat assessment of fatigue and exertional dyspnea.   He has a history of CAD s/p CABG with Dr. Tyrone Sage 12/10/11 ( LIMA to mid LAD, SVG-OM1 and OM2, SVG-PDA), HLD, HTN here today for cardiac follow up. CABG in May 2013 and then readmitted June 2013 with NSTEMI. The SVG to OM system was occluded and a drug eluting stent was placed in the native Circumflex. Echo July 2013 with moderate LVH, LVEF 65-70%, trivial pericardial effusion. Patient was seen in followup 03/10/12 by Tereso Newcomer, PA-C and continued to have difficulty with intolerance to isosorbide. This was discontinued and he was placed on amlodipine. He called in with continued chest pain at cardiac rehabilitation. He was then started on Ranexa and his chest pain was much improved. Also with a h/o palpitations and dizzinness. 48 hour monitor December 2013 showed rare PVCs, PACs. He has had right leg swelling since his CABG. LE venous doppler negative for DVT December 2013. Stress myoview June 2014 was negative for ischemia. He was last seen by Dr. [name] 01/2016 and was stable from a cardiac standpoint.  I evaluated him on 11/17/16. At that visit, he presented with complaints of unexplained fatigue and mild exertional dyspnea over the last several weeks. He noted occasional SSCP if he over exerts himself but denies resting symptoms. The discomfort has not been severe enough to cause him to use SL NTG. He also reported unexplained dry cough over the past 2 months. No mucus production. He denied orthopnea, PND and LEE. He is not on an ACE-I.  He reported full medication compliance. Was slightly elevated in the 140s systolic. EKG showed NSR with new RBBB.  I ordred both  a nuclear stress test and a 2D echo. Both studies were normal. NST was negative for ischemia. 2D echo showed normal LVEF, wall motion and normal valve anoatomy. No pericaridal effusion. Given his elevated BP, I advised that he increase the dose of his amlodipine to 5 mg daily.   Today in f/u, he notes improvement in symptoms since increasing the dose of his amlodipine. His BP is improved and stable in the 120s systolic. He continues to have occasional symptoms of exertional CP and dyspnea, however in a predictable pattern with moderate to heavy physical activity that improves with rests. Symptoms are more c/w stable angina.    Current Meds  Medication Sig  . amLODipine (NORVASC) 5 MG tablet Take 5 mg by mouth daily.  [name] aspirin 81 MG EC tablet Take 1 tablet (81 mg total) by mouth daily.  [name] atorvastatin (LIPITOR) 80 MG tablet Take 1 tablet (80 mg total) by mouth daily.  . clopidogrel (PLAVIX) 75 MG tablet Take 1 tablet (75 mg total) by mouth daily.  . metoprolol tartrate (LOPRESSOR) 25 MG tablet TAKE 1 TABLET (25 MG TOTAL) BY MOUTH 2 (TWO) TIMES DAILY.  . nitroGLYCERIN (NITROSTAT) 0.4 MG SL tablet Place 1 tablet (0.4 mg total) under the tongue every 5 (five) minutes as needed for chest pain (up to 3 doses).  . ranolazine (RANEXA) 500 MG 12 hr tablet Take 1 tablet (500 mg total) by mouth 2 (two) times daily.  . [DISCONTINUED] amLODipine (NORVASC) 2.5 MG tablet Take 1 tablet (2.5  mg total) by mouth daily.   No Known Allergies Past Medical History:  Diagnosis Date  . Chest pain    Imdur added during admxn 7/13=> intol 2/2 HA (stopped): Norvasc added 8/13;  Ranexa 500 bid added 8/13 (QTc stable) => CP much improved  . Chronic neck pain   . Coronary artery disease    s/p 4V CABG 12/10/11 (LIMA to LAD, sequential SVG to OM1 & OM2, SVG to PDA);  NSTEMI 01/13/12:  LHC 01/14/12: pLAD 40%, mLAD occluded, Dx 40%, pCFX 90%, OM1 small and occluded at the ostium, OM2 small with 99% stenosis, proximal and mid RCA  50%, mRCA occluded, S-OM1 and OM2 occluded, S-PDA okay, LIMA-LAD okay, EF 60%.  PCI: Promus DES to the pCFX    . Cough   . Fibromyalgia   . Headache(784.0)   . History of tobacco abuse    0.5 packs/day for 25 years, quit 2005  . HLD (hyperlipidemia)   . Hypertension   . Pericardial effusion    echo 12/29/11 small to moderate pericardial effusion primarily posteriorly and anterolaterally; echo 01/14/12: Mild LVH, EF 65%, moderate pericardial effusion circumferential to the heart with no evidence of Tamponade-similar to 12/29/11 ; trivial effusion by echo 7/13  . Pneumonia    HX OF pna   Family History  Problem Relation Age of Onset  . Hypertension Mother   . Hypertension Father   . Crohn's disease Father    Past Surgical History:  Procedure Laterality Date  . APPENDECTOMY    . CORONARY ARTERY BYPASS GRAFT  12/10/2011   Procedure: CORONARY ARTERY BYPASS GRAFTING (CABG);  Surgeon: Delight Ovens, MD;  Location[facility];  Service: Open Heart Surgery;  Laterality: N/A;  Times four  using endoscopically harvested right greater saphenous and left internal mammary artery.  [name] LEFT HEART CATHETERIZATION WITH CORONARY ANGIOGRAM N/A 12/07/2011   Procedure: LEFT HEART CATHETERIZATION WITH CORONARY ANGIOGRAM;  Surgeon: [name], MD;  Location[facility] CATH LAB;  Service: Cardiovascular;  Laterality: N/A;  . LEFT HEART CATHETERIZATION WITH CORONARY ANGIOGRAM N/A 01/14/2012   Procedure: LEFT HEART CATHETERIZATION WITH CORONARY ANGIOGRAM;  Surgeon: [name], MD;  Location: [facility] CATH LAB;  Service: Cardiovascular;  Laterality: N/A;  . MOUTH SURGERY    . NASAL SINUS SURGERY    . PERCUTANEOUS CORONARY STENT INTERVENTION (PCI-S)  01/14/2012   Procedure: PERCUTANEOUS CORONARY STENT INTERVENTION (PCI-S);  Surgeon: [name], MD;  Location: [facility] CATH LAB;  Service: Cardiovascular;;   Social History   Social History  . Marital status: Legally Separated    Spouse name: N/A  .  Number of children: N/A  . Years of education: N/A   Occupational History  . Not on file.   Social History Main Topics  . Smoking status: Former Smoker    Packs/day: 0.50    Years: 25.00    Types: Cigarettes    Quit date: 12/07/2003  . Smokeless tobacco: Never Used  . Alcohol use No  . Drug use: No  . Sexual activity: Yes   Other Topics Concern  . Not on file   Social History Narrative  . No narrative on file     Review of Systems: General: negative for chills, fever, night sweats or weight changes.  Cardiovascular: negative for chest pain, dyspnea on exertion, edema, orthopnea, palpitations, paroxysmal nocturnal dyspnea or shortness of breath Dermatological: negative for rash Respiratory: negative for cough or wheezing Urologic: negative for hematuria Abdominal: negative for nausea, vomiting, diarrhea, bright red  blood per rectum, melena, or hematemesis Neurologic: negative for visual changes, syncope, or dizziness All other systems reviewed and are otherwise negative except as noted above.   Physical Exam:  Blood pressure 128/74, pulse 77, resp. rate 16, height 5\' 10"  (1.778 m), weight 196 lb 4 oz (89 kg), SpO2 98 %.  General appearance: alert, cooperative and no distress Neck: no carotid bruit and no JVD Lungs: clear to auscultation bilaterally Heart: regular rate and rhythm, S1, S2 normal, no murmur, click, rub or gallop Extremities: extremities normal, atraumatic, no cyanosis or edema Pulses: 2+ and symmetric Skin: Skin color, texture, turgor normal. No rashes or lesions Neurologic: Grossly normal  EKG not performed  -- personally reviewed   NST 12/08/16 Study Highlights    Nuclear stress EF: 68%. The left ventricular ejection fraction is hyperdynamic (>65%).  The study is normal.  This is a low risk study.     2D Echo 12/08/16 Study Conclusions  - Left ventricle: The cavity size was normal. Systolic function was   normal. The estimated ejection fraction  was in the range of 60%   to 65%. Wall motion was normal; there were no regional wall   motion abnormalities. Left ventricular diastolic function   parameters were normal. Doppler parameters are consistent with   indeterminate ventricular filling pressure. - Aortic valve: Transvalvular velocity was within the normal range.   There was no stenosis. There was no regurgitation. - Mitral valve: Transvalvular velocity was within the normal range.   There was no evidence for stenosis. There was trivial   regurgitation. - Right ventricle: The cavity size was normal. Wall thickness was   normal. Systolic function was normal. - Tricuspid valve: There was trivial regurgitation. - Pulmonary arteries: Systolic pressure was within the normal   range. PA peak pressure: 22 mm Hg (S).    ASSESSMENT AND PLAN:   641. CAD: 61 year old male with known coronary artery disease status post CABG surgery in 2013 with early graft failure 1 month after his bypass surgery (occluded saphenous vein graft to the OM>> PCI + DES to native LCx), presenting for f/u for exertional dyspnea and chest discomfort with moderate to heavy physical activity, that resolves quickly with rest. He has not required use of SL NTG and denies any resting CP. NST 12/08/16 was negative for ischemia with normal LVF. 2D echo also normal with normal LVEF at 60-65% with normal wall motion. His symptoms have improved with increase in dose of amlodipine and better BP control. He is also on Ranexa for small vessel disease. Based on predictable pattern of symptoms and negative stress test, it appears his symptoms are likely related to small vessel disease/ stable angina. We will continue medical therapy for now. We discussed further increase in his Ranexa to 1000 mg BID, but he wants to hold off on this. We discussed the difference between stable angina and unstable angina, and when to notify us if any change in the pattern/ severity of his symptoms.    Follow-Up w/ Dr. Clifton JamesMcAlhany in 6 months.   Brittainy Delmer IslamSimmons PA-C, [facility] 12/13/2016 3:46 PM

## 2016-12-15 ENCOUNTER — Encounter: Payer: Self-pay | Admitting: *Deleted

## 2017-02-15 ENCOUNTER — Other Ambulatory Visit: Payer: Self-pay | Admitting: Cardiovascular Disease

## 2017-02-15 ENCOUNTER — Other Ambulatory Visit: Payer: Self-pay | Admitting: Cardiology

## 2017-02-15 DIAGNOSIS — I2510 Atherosclerotic heart disease of native coronary artery without angina pectoris: Secondary | ICD-10-CM

## 2017-02-15 DIAGNOSIS — E785 Hyperlipidemia, unspecified: Secondary | ICD-10-CM

## 2017-02-27 ENCOUNTER — Other Ambulatory Visit: Payer: Self-pay | Admitting: Cardiovascular Disease

## 2017-02-27 DIAGNOSIS — I2510 Atherosclerotic heart disease of native coronary artery without angina pectoris: Secondary | ICD-10-CM

## 2017-02-27 DIAGNOSIS — E785 Hyperlipidemia, unspecified: Secondary | ICD-10-CM

## 2017-02-28 ENCOUNTER — Other Ambulatory Visit: Payer: Self-pay | Admitting: Cardiovascular Disease

## 2017-02-28 DIAGNOSIS — I2510 Atherosclerotic heart disease of native coronary artery without angina pectoris: Secondary | ICD-10-CM

## 2017-02-28 DIAGNOSIS — E785 Hyperlipidemia, unspecified: Secondary | ICD-10-CM

## 2017-05-07 ENCOUNTER — Other Ambulatory Visit: Payer: Self-pay | Admitting: Cardiovascular Disease

## 2017-05-07 DIAGNOSIS — E785 Hyperlipidemia, unspecified: Secondary | ICD-10-CM

## 2017-05-07 DIAGNOSIS — I2510 Atherosclerotic heart disease of native coronary artery without angina pectoris: Secondary | ICD-10-CM

## 2017-05-16 ENCOUNTER — Other Ambulatory Visit: Payer: Self-pay | Admitting: Cardiology

## 2017-05-16 DIAGNOSIS — I2510 Atherosclerotic heart disease of native coronary artery without angina pectoris: Secondary | ICD-10-CM

## 2017-08-08 ENCOUNTER — Other Ambulatory Visit: Payer: Self-pay | Admitting: Cardiology

## 2017-08-08 ENCOUNTER — Other Ambulatory Visit: Payer: Self-pay | Admitting: Cardiovascular Disease

## 2017-08-08 DIAGNOSIS — I2510 Atherosclerotic heart disease of native coronary artery without angina pectoris: Secondary | ICD-10-CM

## 2017-08-08 MED ORDER — AMLODIPINE BESYLATE 5 MG PO TABS: 5.0000 mg | ORAL_TABLET | Freq: Every day | ORAL | 1 refills | Status: DC

## 2017-12-01 ENCOUNTER — Other Ambulatory Visit: Payer: Self-pay | Admitting: Cardiovascular Disease

## 2017-12-01 DIAGNOSIS — I2510 Atherosclerotic heart disease of native coronary artery without angina pectoris: Secondary | ICD-10-CM

## 2017-12-01 DIAGNOSIS — E785 Hyperlipidemia, unspecified: Secondary | ICD-10-CM

## 2017-12-08 ENCOUNTER — Other Ambulatory Visit: Payer: Self-pay | Admitting: Cardiovascular Disease

## 2017-12-08 DIAGNOSIS — I2510 Atherosclerotic heart disease of native coronary artery without angina pectoris: Secondary | ICD-10-CM

## 2017-12-08 DIAGNOSIS — E785 Hyperlipidemia, unspecified: Secondary | ICD-10-CM

## 2017-12-15 ENCOUNTER — Encounter (INDEPENDENT_AMBULATORY_CARE_PROVIDER_SITE_OTHER): Payer: Self-pay

## 2017-12-15 ENCOUNTER — Other Ambulatory Visit: Payer: Self-pay | Admitting: Cardiovascular Disease

## 2017-12-15 DIAGNOSIS — E785 Hyperlipidemia, unspecified: Secondary | ICD-10-CM

## 2017-12-15 DIAGNOSIS — I2510 Atherosclerotic heart disease of native coronary artery without angina pectoris: Secondary | ICD-10-CM

## 2017-12-15 MED ORDER — NITROGLYCERIN 0.4 MG SL SUBL: 0.4000 mg | SUBLINGUAL_TABLET | SUBLINGUAL | 6 refills | Status: DC | PRN

## 2017-12-19 ENCOUNTER — Other Ambulatory Visit: Payer: Self-pay | Admitting: Cardiovascular Disease

## 2017-12-19 DIAGNOSIS — E785 Hyperlipidemia, unspecified: Secondary | ICD-10-CM

## 2017-12-19 DIAGNOSIS — I2510 Atherosclerotic heart disease of native coronary artery without angina pectoris: Secondary | ICD-10-CM

## 2018-01-01 ENCOUNTER — Other Ambulatory Visit: Payer: Self-pay | Admitting: Cardiovascular Disease

## 2018-01-01 DIAGNOSIS — I2510 Atherosclerotic heart disease of native coronary artery without angina pectoris: Secondary | ICD-10-CM

## 2018-01-01 DIAGNOSIS — E785 Hyperlipidemia, unspecified: Secondary | ICD-10-CM

## 2018-01-30 ENCOUNTER — Other Ambulatory Visit: Payer: Self-pay | Admitting: Cardiology

## 2018-02-02 ENCOUNTER — Other Ambulatory Visit: Payer: Self-pay | Admitting: Cardiovascular Disease

## 2018-02-02 DIAGNOSIS — I2510 Atherosclerotic heart disease of native coronary artery without angina pectoris: Secondary | ICD-10-CM

## 2018-02-02 DIAGNOSIS — E785 Hyperlipidemia, unspecified: Secondary | ICD-10-CM

## 2018-02-06 ENCOUNTER — Other Ambulatory Visit: Payer: Self-pay | Admitting: Cardiology

## 2018-02-06 DIAGNOSIS — I2510 Atherosclerotic heart disease of native coronary artery without angina pectoris: Secondary | ICD-10-CM

## 2018-02-06 DIAGNOSIS — E785 Hyperlipidemia, unspecified: Secondary | ICD-10-CM

## 2018-02-08 ENCOUNTER — Other Ambulatory Visit: Payer: Self-pay | Admitting: Cardiovascular Disease

## 2018-02-08 DIAGNOSIS — I2510 Atherosclerotic heart disease of native coronary artery without angina pectoris: Secondary | ICD-10-CM

## 2018-02-08 DIAGNOSIS — E785 Hyperlipidemia, unspecified: Secondary | ICD-10-CM

## 2018-02-08 MED ORDER — METOPROLOL TARTRATE 25 MG PO TABS: 25.0000 mg | ORAL_TABLET | Freq: Two times a day (BID) | ORAL | 1 refills | Status: DC

## 2018-02-08 MED ORDER — RANOLAZINE ER 500 MG PO TB12: 500.0000 mg | ORAL_TABLET | Freq: Two times a day (BID) | ORAL | 1 refills | Status: DC

## 2018-02-08 MED ORDER — CLOPIDOGREL BISULFATE 75 MG PO TABS: 75.0000 mg | ORAL_TABLET | Freq: Every day | ORAL | 1 refills | Status: DC

## 2018-02-08 NOTE — Telephone Encounter (Signed)
New Message     *STAT* If patient is at the pharmacy, call can be transferred to refill team.   1. Which medications need to be refilled? (please list name of each medication and dose if known) metoprolol tartrate (LOPRESSOR) 25 MG tablet,   clopidogrel (PLAVIX) 75 MG tablet  And   ranolazine (RANEXA) 500 MG 12 hr tablet       2. Which pharmacy/location (including street and city if local pharmacy) is medication to be sent to? [id number]- [place], [place] - [address]. Do they need a 30 day or 90 day supply? 30 day

## 2018-02-08 NOTE — Telephone Encounter (Signed)
Pt's medication was sent to pt's pharmacy as requested. Confirmation received.  °

## 2018-02-16 ENCOUNTER — Other Ambulatory Visit: Payer: Self-pay | Admitting: Cardiology

## 2018-02-16 DIAGNOSIS — E785 Hyperlipidemia, unspecified: Secondary | ICD-10-CM

## 2018-02-16 DIAGNOSIS — I2510 Atherosclerotic heart disease of native coronary artery without angina pectoris: Secondary | ICD-10-CM

## 2018-02-17 ENCOUNTER — Encounter: Payer: Self-pay | Admitting: Cardiovascular Disease

## 2018-02-22 ENCOUNTER — Encounter: Payer: Self-pay | Admitting: Cardiovascular Disease

## 2018-02-22 ENCOUNTER — Other Ambulatory Visit: Payer: Self-pay | Admitting: Cardiovascular Disease

## 2018-02-22 DIAGNOSIS — E785 Hyperlipidemia, unspecified: Secondary | ICD-10-CM

## 2018-02-22 DIAGNOSIS — I2510 Atherosclerotic heart disease of native coronary artery without angina pectoris: Secondary | ICD-10-CM

## 2018-02-22 MED ORDER — AMLODIPINE BESYLATE 5 MG PO TABS: 5.0000 mg | ORAL_TABLET | Freq: Every day | ORAL | 0 refills | Status: DC

## 2018-02-22 NOTE — Telephone Encounter (Signed)
Pt's medication was sent to pt's pharmacy as requested. Confirmation received.  °

## 2018-03-18 NOTE — Progress Notes (Signed)
Chief Complaint  Patient presents with  . Follow-up    CAD   History of Present Illness: 62 yo male with history of CAD s/p 4V CABG in 2013  ( LIMA to mid LAD, SVG-OM1 and OM2, SVG-PDA), HLD, HTN here today for cardiac follow up. CABG in May 2013 and then readmitted June 2013 with NSTEMI. The SVG to OM system was occluded and a drug eluting stent was placed in the native Circumflex. Echo July 2013 with moderate LVH, LVEF 65-70%, trivial pericardial effusion. Patient was seen in followup 03/10/12 by Richardson Dopp, PA-C and continued to have difficulty with intolerance to isosorbide. This was discontinued and he was placed on amlodipine. He called in with continued chest pain and cardiac rehabilitation. He was then started on Ranexa and his chest pain was much improved. Visit here 06/21/12 and c/o dizziness and palpitations. 48 hour monitor December 2013 showed rare PVCs, PACs. He has had right leg swelling since his CABG. LE venous doppler negative for DVT December 2013. I saw him in May 2014 and he had c/o fatigue, HA, dizziness, nausea, muscle stiffness. Stress myoview June 2014 without ischemia. He c/o dyspnea and fatigue in May 2018 which lead to a nuclear stress test that showed no ischemia and an echo that showed normal LV systolic function.   He is here today for follow up. The patient denies any chest pain, dyspnea, palpitations, lower extremity edema, orthopnea, PND, dizziness, near syncope or syncope.   Primary Care Physician: Donald Prose, MD  Past Medical History:  Diagnosis Date  . Chest pain    Imdur added during admxn 7/13=> intol 2/2 HA (stopped): Norvasc added 8/13;  Ranexa 500 bid added 8/13 (QTc stable) => CP much improved  . Chronic neck pain   . Coronary artery disease    s/p 4V CABG 12/10/11 (LIMA to LAD, sequential SVG to OM1 & OM2, SVG to PDA);  NSTEMI 01/13/12:  LHC 01/14/12: pLAD 40%, mLAD occluded, Dx 40%, pCFX 90%, OM1 small and occluded at the ostium, OM2 small with 99%  stenosis, proximal and mid RCA 50%, mRCA occluded, S-OM1 and OM2 occluded, S-PDA okay, LIMA-LAD okay, EF 60%.  PCI: Promus DES to the pCFX    . Cough   . Fibromyalgia   . Headache(784.0)   . History of tobacco abuse    0.5 packs/day for 25 years, quit 2005  . HLD (hyperlipidemia)   . Hypertension   . Pericardial effusion    echo 12/29/11 small to moderate pericardial effusion primarily posteriorly and anterolaterally; echo 01/14/12: Mild LVH, EF 65%, moderate pericardial effusion circumferential to the heart with no evidence of Tamponade-similar to 12/29/11 ; trivial effusion by echo 7/13  . Pneumonia    HX OF pna    Past Surgical History:  Procedure Laterality Date  . APPENDECTOMY    . CORONARY ARTERY BYPASS GRAFT  12/10/2011   Procedure: CORONARY ARTERY BYPASS GRAFTING (CABG);  Surgeon: Grace Isaac, MD;  Location: [place];  Service: Open Heart Surgery;  Laterality: N/A;  Times four  using endoscopically harvested right greater saphenous and left internal mammary artery.  [name] LEFT HEART CATHETERIZATION WITH CORONARY ANGIOGRAM N/A 12/07/2011   Procedure: LEFT HEART CATHETERIZATION WITH CORONARY ANGIOGRAM;  Surgeon: Burnell Blanks, MD;  Location[facility] C[facility] LAB;  Service: Cardiovascular;  Laterality: N/A;  . LEFT HEART CATHETERIZATION WITH CORONARY ANGIOGRAM N/A 01/14/2012   Procedure: LEFT HEART CATHETERIZATION WITH CORONARY ANGIOGRAM;  Surgeon: Burnell Blanks, MD;  Location: [facility] CATH  LAB;  Service: Cardiovascular;  Laterality: N/A;  . MOUTH SURGERY    . NASAL SINUS SURGERY    . PERCUTANEOUS CORONARY STENT INTERVENTION (PCI-S)  01/14/2012   Procedure: PERCUTANEOUS CORONARY STENT INTERVENTION (PCI-S);  Surgeon: Christopher D McAlhany, MD;  Location: [facility] CATH LAB;  Service: Cardiovascular;;    Current Outpatient Medications  Medication Sig Dispense Refill  . amLODipine (NORVASC) 5 MG tablet Take 1 tablet (5 mg total) by mouth daily. 90 tablet 3  . aspirin 81 MG EC tablet Take 1  tablet (81 mg total) by mouth daily.    . atorvastatin (LIPITOR) 80 MG tablet Take 1 tablet (80 mg total) by mouth daily. 90 tablet 0  . clopidogrel (PLAVIX) 75 MG tablet Take 1 tablet (75 mg total) by mouth daily. Please keep upcoming appt in August for future refills. Thank you 30 tablet 1  . metoprolol tartrate (LOPRESSOR) 25 MG tablet Take 1 tablet (25 mg total) by mouth 2 (two) times daily. 3 180 tablet 3  . nitroGLYCERIN (NITROSTAT) 0.4 MG SL tablet Place 1 tablet (0.4 mg total) under the tongue every 5 (five) minutes as needed for chest pain (up to 3 doses). 25 tablet 6  . ranolazine (RANEXA) 500 MG 12 hr tablet Take 1 tablet (500 mg total) by mouth 2 (two) times daily. Please keep upcoming appt in August for future refills. Thank you 60 tablet 1   No current facility-administered medications for this visit.     No Known Allergies  Social History   Socioeconomic History  . Marital status: Legally Separated    Spouse name: Not on file  . Number of children: Not on file  . Years of education: Not on file  . Highest education level: Not on file  Occupational History  . Not on file  Social Needs  . Financial resource strain: Not on file  . Food insecurity:    Worry: Not on file    Inability: Not on file  . Transportation needs:    Medical: Not on file    Non-medical: Not on file  Tobacco Use  . Smoking status: Former Smoker    Packs/day: 0.50    Years: 25.00    Pack years: 12.50    Types: Cigarettes    Last attempt to quit: 12/07/2003    Years since quitting: 14.2  . Smokeless tobacco: Never Used  Substance and Sexual Activity  . Alcohol use: No  . Drug use: No  . Sexual activity: Yes  Lifestyle  . Physical activity:    Days per week: Not on file    Minutes per session: Not on file  . Stress: Not on file  Relationships  . Social connections:    Talks on phone: Not on file    Gets together: Not on file    Attends religious service: Not on file    Active member of  club or organization: Not on file    Attends meetings of clubs or organizations: Not on file    Relationship status: Not on file  . Intimate partner violence:    Fear of current or ex partner: Not on file    Emotionally abused: Not on file    Physically abused: Not on file    Forced sexual activity: Not on file  Other Topics Concern  . Not on file  Social History Narrative  . Not on file    Family History  Problem Relation Age of Onset  . Hypertension Mother   .   Hypertension Father   . Crohn's disease Father     Review of Systems:  As stated in the HPI and otherwise negative.   BP 128/80   Pulse 75   Ht 5' 10" (1.778 m)   Wt 200 lb 9.6 oz (91 kg)   SpO2 94%   BMI 28.78 kg/m   Physical Examination:  General: Well developed, well nourished, NAD  HEENT: OP clear, mucus membranes moist  SKIN: warm, dry. No rashes. Neuro: No focal deficits  Musculoskeletal: Muscle strength 5/5 all ext  Psychiatric: Mood and affect normal  Neck: No JVD, no carotid bruits, no thyromegaly, no lymphadenopathy.  Lungs:Clear bilaterally, no wheezes, rhonci, crackles Cardiovascular: Regular rate and rhythm. No murmurs, gallops or rubs. Abdomen:Soft. Bowel sounds present. Non-tender.  Extremities: No lower extremity edema. Pulses are 2 + in the bilateral DP/PT.  Echo May 2018: - Left ventricle: The cavity size was normal. Systolic function was   normal. The estimated ejection fraction was in the range of 60%   to 65%. Wall motion was normal; there were no regional wall   motion abnormalities. Left ventricular diastolic function   parameters were normal. Doppler parameters are consistent with   indeterminate ventricular filling pressure. - Aortic valve: Transvalvular velocity was within the normal range.   There was no stenosis. There was no regurgitation. - Mitral valve: Transvalvular velocity was within the normal range.   There was no evidence for stenosis. There was trivial    regurgitation. - Right ventricle: The cavity size was normal. Wall thickness was   normal. Systolic function was normal. - Tricuspid valve: There was trivial regurgitation. - Pulmonary arteries: Systolic pressure was within the normal   range. PA peak pressure: 22 mm Hg (S).  EKG:  EKG is ordered today. The ekg ordered today demonstrates NSR, rate 62 bpm. Old inferior   Recent Labs: No results found for requested labs within last 8760 hours.   Lipid Panel    Component Value Date/Time   CHOL 154 09/21/2013 0836   TRIG 196.0 (H) 09/21/2013 0836   HDL 42.10 09/21/2013 0836   CHOLHDL 4 09/21/2013 0836   VLDL 39.2 09/21/2013 0836   LDLCALC 73 09/21/2013 0836     Wt Readings from Last 3 Encounters:  03/20/18 200 lb 9.6 oz (91 kg)  12/13/16 196 lb 4 oz (89 kg)  11/17/16 195 lb (88.5 kg)     Other studies Reviewed: Additional studies/ records that were reviewed today include: . Review of the above records demonstrates:    Assessment and Plan:   1. CAD with angina: No chest pain on Ranexa. Continue ASA, Plavix, statin, beta blocker and Ranexa.     2. Hyperlipidemia: Will check lipids and LFTs today. Continue statin.    Current medicines are reviewed at length with the patient today.  The patient does not have concerns regarding medicines.  The following changes have been made:  no change  Labs/ tests ordered today include:   Orders Placed This Encounter  Procedures  . Comp Met (CMET)  . Lipid Profile  . EKG 12-Lead     Disposition:   FU with me in 12  months   Signed, Lauree Chandler, MD 03/20/2018 4:48 PM    [facility] [place], [place], [place]  [address] Phone: [phone]; Fax: [phone]

## 2018-03-19 ENCOUNTER — Other Ambulatory Visit: Payer: Self-pay | Admitting: Cardiovascular Disease

## 2018-03-20 ENCOUNTER — Ambulatory Visit: Payer: Managed Care, Other (non HMO) | Admitting: Cardiovascular Disease

## 2018-03-20 ENCOUNTER — Encounter: Payer: Self-pay | Admitting: Cardiovascular Disease

## 2018-03-20 DIAGNOSIS — E785 Hyperlipidemia, unspecified: Secondary | ICD-10-CM | POA: Diagnosis not present

## 2018-03-20 DIAGNOSIS — I2510 Atherosclerotic heart disease of native coronary artery without angina pectoris: Secondary | ICD-10-CM

## 2018-03-20 MED ORDER — METOPROLOL TARTRATE 25 MG PO TABS: 25.0000 mg | ORAL_TABLET | Freq: Two times a day (BID) | ORAL | 3 refills | Status: DC

## 2018-03-20 MED ORDER — AMLODIPINE BESYLATE 5 MG PO TABS: 5.0000 mg | ORAL_TABLET | Freq: Every day | ORAL | 3 refills | Status: DC

## 2018-03-20 MED ORDER — NITROGLYCERIN 0.4 MG SL SUBL: 0.4000 mg | SUBLINGUAL_TABLET | SUBLINGUAL | 6 refills | Status: DC | PRN

## 2018-03-20 NOTE — Patient Instructions (Signed)
Medication Instructions:  Your physician recommends that you continue on your current medications as directed. Please refer to the Current Medication list given to you today.   Labwork: none  Testing/Procedures: none  Follow-Up: Your physician recommends that you schedule a follow-up appointment in: 12 months. Please contact our office in about 8 months to schedule this appointment   Any Other Special Instructions Will Be Listed Below (If Applicable).     If you need a refill on your cardiac medications before your next appointment, please call your pharmacy.

## 2018-03-21 LAB — COMPREHENSIVE METABOLIC PANEL
ALT: 23 IU/L (ref 0–44)
AST: 19 IU/L (ref 0–40)
Albumin/Globulin Ratio: 2.5 — ABNORMAL HIGH (ref 1.2–2.2)
Albumin: 4.7 g/dL (ref 3.6–4.8)
Alkaline Phosphatase: 87 IU/L (ref 39–117)
BUN/Creatinine Ratio: 11 (ref 10–24)
BUN: 13 mg/dL (ref 8–27)
Bilirubin Total: 0.7 mg/dL (ref 0.0–1.2)
CALCIUM: 9.8 mg/dL (ref 8.6–10.2)
CO2: 22 mmol/L (ref 20–29)
Chloride: 103 mmol/L (ref 96–106)
Creatinine, Ser: 1.2 mg/dL (ref 0.76–1.27)
GFR calc Af Amer: 74 mL/min/{1.73_m2} (ref >59)
GFR, EST NON AFRICAN AMERICAN: 64 mL/min/{1.73_m2} (ref >59)
Globulin, Total: 1.9 g/dL (ref 1.5–4.5)
Glucose: 91 mg/dL (ref 65–99)
Potassium: 4.1 mmol/L (ref 3.5–5.2)
Sodium: 143 mmol/L (ref 134–144)
Total Protein: 6.6 g/dL (ref 6.0–8.5)

## 2018-03-21 LAB — LIPID PANEL
Chol/HDL Ratio: 3.5 ratio (ref 0.0–5.0)
Cholesterol, Total: 134 mg/dL (ref 100–199)
HDL: 38 mg/dL — ABNORMAL LOW (ref >39)
LDL Calculated: 79 mg/dL (ref 0–99)
Triglycerides: 87 mg/dL (ref 0–149)
VLDL Cholesterol Cal: 17 mg/dL (ref 5–40)

## 2018-04-09 ENCOUNTER — Other Ambulatory Visit: Payer: Self-pay | Admitting: Cardiovascular Disease

## 2018-04-09 DIAGNOSIS — I2510 Atherosclerotic heart disease of native coronary artery without angina pectoris: Secondary | ICD-10-CM

## 2018-04-09 DIAGNOSIS — E785 Hyperlipidemia, unspecified: Secondary | ICD-10-CM

## 2018-05-27 ENCOUNTER — Other Ambulatory Visit: Payer: Self-pay | Admitting: Cardiovascular Disease

## 2018-05-27 DIAGNOSIS — E785 Hyperlipidemia, unspecified: Secondary | ICD-10-CM

## 2018-05-27 DIAGNOSIS — I2510 Atherosclerotic heart disease of native coronary artery without angina pectoris: Secondary | ICD-10-CM

## 2018-05-29 ENCOUNTER — Other Ambulatory Visit: Payer: Self-pay

## 2018-05-29 DIAGNOSIS — E785 Hyperlipidemia, unspecified: Secondary | ICD-10-CM

## 2018-05-29 DIAGNOSIS — I2510 Atherosclerotic heart disease of native coronary artery without angina pectoris: Secondary | ICD-10-CM

## 2018-05-29 MED ORDER — ATORVASTATIN CALCIUM 80 MG PO TABS: 80.0000 mg | ORAL_TABLET | Freq: Every day | ORAL | 3 refills | Status: DC

## 2018-05-29 NOTE — Telephone Encounter (Signed)
I am ok with this pat.   Michael Spears

## 2018-12-18 DIAGNOSIS — E785 Hyperlipidemia, unspecified: Secondary | ICD-10-CM

## 2018-12-18 DIAGNOSIS — I2510 Atherosclerotic heart disease of native coronary artery without angina pectoris: Secondary | ICD-10-CM

## 2018-12-19 MED ORDER — RANOLAZINE ER 500 MG PO TB12: 500.0000 mg | ORAL_TABLET | Freq: Two times a day (BID) | ORAL | 2 refills | Status: DC

## 2018-12-19 NOTE — Telephone Encounter (Signed)
Pt's medication was sent to pt's pharmacy as requested. Confirmation received.  °

## 2019-01-31 DIAGNOSIS — H1851 Endothelial corneal dystrophy: Secondary | ICD-10-CM | POA: Diagnosis not present

## 2019-01-31 DIAGNOSIS — H16141 Punctate keratitis, right eye: Secondary | ICD-10-CM | POA: Diagnosis not present

## 2019-02-24 DIAGNOSIS — I2510 Atherosclerotic heart disease of native coronary artery without angina pectoris: Secondary | ICD-10-CM

## 2019-02-24 DIAGNOSIS — E785 Hyperlipidemia, unspecified: Secondary | ICD-10-CM

## 2019-02-27 MED ORDER — ATORVASTATIN CALCIUM 80 MG PO TABS: 80.0000 mg | ORAL_TABLET | Freq: Every day | ORAL | 0 refills | Status: DC

## 2019-02-27 MED ORDER — RANOLAZINE ER 500 MG PO TB12: 500.0000 mg | ORAL_TABLET | Freq: Two times a day (BID) | ORAL | 0 refills | Status: DC

## 2019-02-27 NOTE — Telephone Encounter (Signed)
Pt's medications were sent to pt's pharmacy as requested. Confirmation received.  

## 2019-03-10 ENCOUNTER — Other Ambulatory Visit: Payer: Self-pay | Admitting: Cardiovascular Disease

## 2019-03-10 DIAGNOSIS — E785 Hyperlipidemia, unspecified: Secondary | ICD-10-CM

## 2019-03-10 DIAGNOSIS — I2510 Atherosclerotic heart disease of native coronary artery without angina pectoris: Secondary | ICD-10-CM

## 2019-03-21 ENCOUNTER — Telehealth: Payer: Self-pay

## 2019-03-21 ENCOUNTER — Other Ambulatory Visit: Payer: Self-pay

## 2019-03-21 ENCOUNTER — Other Ambulatory Visit: Payer: Self-pay | Admitting: Cardiovascular Disease

## 2019-03-21 ENCOUNTER — Telehealth (INDEPENDENT_AMBULATORY_CARE_PROVIDER_SITE_OTHER): Payer: BC Managed Care – PPO | Admitting: Cardiology

## 2019-03-21 VITALS — Ht 70.0 in | Wt 192.0 lb

## 2019-03-21 DIAGNOSIS — I2510 Atherosclerotic heart disease of native coronary artery without angina pectoris: Secondary | ICD-10-CM

## 2019-03-21 DIAGNOSIS — R5383 Other fatigue: Secondary | ICD-10-CM

## 2019-03-21 DIAGNOSIS — E785 Hyperlipidemia, unspecified: Secondary | ICD-10-CM

## 2019-03-21 DIAGNOSIS — Z79899 Other long term (current) drug therapy: Secondary | ICD-10-CM

## 2019-03-21 MED ORDER — AMLODIPINE BESYLATE 5 MG PO TABS: 5.0000 mg | ORAL_TABLET | Freq: Every day | ORAL | 3 refills | Status: DC

## 2019-03-21 NOTE — Telephone Encounter (Signed)
Follow up:      Patient returning call back for appt.

## 2019-03-21 NOTE — Patient Instructions (Signed)
Medication Instructions:  NONE If you need a refill on your cardiac medications before your next appointment, please call your pharmacy.   Lab work:Please schedule same day as Nurse Visit (within 1 week) CBC BMP TSH FLP HFT If you have labs (blood work) drawn today and your tests are completely normal, you will receive your results only by: [name] MyChart Message (if you have MyChart) OR . A paper copy in the mail If you have any lab test that is abnormal or we need to change your treatment, we will call you to review the results.  Testing/Procedures: NONE  Follow-Up:  Please schedule Nurse Visit per Lyda Jester, PA (EKG, BP, HR)  Same day as labs  1 Year f/u with Dr Angelena Form  At [facility], you and your health needs are our priority.  As part of our continuing mission to provide you with exceptional heart care, we have created designated Provider Care Teams.  These Care Teams include your primary Cardiologist (physician) and Advanced Practice Providers (APPs -  Physician Assistants and Nurse Practitioners) who all work together to provide you with the care you need, when you need it. .   Any Other Special Instructions Will Be Listed Below (If Applicable).  Someone will call you to schedule

## 2019-03-21 NOTE — Progress Notes (Signed)
Virtual Visit via Telephone Note   This visit type was conducted due to national recommendations for restrictions regarding the COVID-19 Pandemic (e.g. social distancing) in an effort to limit this patient's exposure and mitigate transmission in our community.  Due to his co-morbid illnesses, this patient is at least at moderate risk for complications without adequate follow up.  This format is felt to be most appropriate for this patient at this time.  The patient did not have access to video technology/had technical difficulties with video requiring transitioning to audio format only (telephone).  All issues noted in this document were discussed and addressed.  No physical exam could be performed with this format.  Please refer to the patient's chart for his  consent to telehealth for [facility].   Date:  03/21/2019   ID:  Michael Spears, DOB [birth year], MRN [id number]  Patient Location: Home Provider Location: Home  PCP:  Donald Prose, MD  Cardiologist:  Lauree Chandler, MD  Electrophysiologist:  None   Evaluation Performed:  Follow-Up Visit  Chief Complaint:  Annual F/u for CAD and HLD, Complains of chronic fatigue  History of Present Illness:    Michael Spears is a 63 yo male with history of CAD s/p 4V CABG in 2013  ( LIMA to mid LAD, SVG-OM1 and OM2, SVG-PDA), HLD and HTN being evaluated for his annual cardiac f/u. He is followed by Dr. Angelena Form.    He had CABG in May 2013 and then readmitted June 2013 with NSTEMI. The SVG to OM system was occluded and a drug eluting stent was placed in the native Circumflex. Echo July 2013 with moderate LVH, LVEF 65-70%, trivial pericardial effusion. Patient was seen in followup 03/10/12 by Richardson Dopp, PA-C and continued to have difficulty with intolerance to isosorbide. This was discontinued and he was placed on amlodipine. He called in with continued chest pain during cardiac rehabilitation. He was then started on Ranexa and his chest pain was  much improved. Visit here 06/21/12 and c/o dizziness and palpitations. 48 hour monitor December 2013 showed rare PVCs, PACs. He has had right leg swelling since his CABG. LE venous doppler negative for DVT December 2013. Dr. Angelena Form saw him in May 2014 and he had c/o fatigue, HA, dizziness, nausea, muscle stiffness. Stress myoview June 2014 without ischemia. He c/o dyspnea and fatigue in May 2018 which lead to a nuclear stress test that showed no ischemia and an echo that showed normal LV systolic function.   Today he reports he is done fairly decent.  He denies chest pain and no dyspnea which was his presenting symptoms prior to undergoing CABG.  However he endorses chronic fatigue.  He does reports that he is tired all the time.  No lightheadedness, dizziness, palpitations, syncope/near syncope.  He reports full medication compliance.  He remains on dual antiplatelet therapy with aspirin and Plavix.  He denies any abnormal bleeding.  No melena or hematochezia.  He is 63 years old and is never had a colonoscopy.  He has not been in to see his PCP this year.  Fortunately he does not have a home blood pressure monitor available to check vital signs.  The patient does not have symptoms concerning for COVID-19 infection (fever, chills, cough, or new shortness of breath).    Past Medical History:  Diagnosis Date  . Chest pain    Imdur added during admxn 7/13=> intol 2/2 HA (stopped): Norvasc added 8/13;  Ranexa 500 bid added 8/13 (QTc stable) =>  CP much improved  . Chronic neck pain   . Coronary artery disease    s/p 4V CABG 12/10/11 (LIMA to LAD, sequential SVG to OM1 & OM2, SVG to PDA);  NSTEMI 01/13/12:  LHC 01/14/12: pLAD 40%, mLAD occluded, Dx 40%, pCFX 90%, OM1 small and occluded at the ostium, OM2 small with 99% stenosis, proximal and mid RCA 50%, mRCA occluded, S-OM1 and OM2 occluded, S-PDA okay, LIMA-LAD okay, EF 60%.  PCI: Promus DES to the pCFX    . Cough   . Fibromyalgia   . Headache(784.0)    . History of tobacco abuse    0.5 packs/day for 25 years, quit 2005  . HLD (hyperlipidemia)   . Hypertension   . Pericardial effusion    echo 12/29/11 small to moderate pericardial effusion primarily posteriorly and anterolaterally; echo 01/14/12: Mild LVH, EF 65%, moderate pericardial effusion circumferential to the heart with no evidence of Tamponade-similar to 12/29/11 ; trivial effusion by echo 7/13  . Pneumonia    HX OF pna   Past Surgical History:  Procedure Laterality Date  . APPENDECTOMY    . CORONARY ARTERY BYPASS GRAFT  12/10/2011   Procedure: CORONARY ARTERY BYPASS GRAFTING (CABG);  Surgeon: Delight OvensEdward B Gerhardt, MD;  [facility];  Service: Open Heart Surgery;  Laterality: N/A;  Times four  using endoscopically harvested right greater saphenous and left internal mammary artery.  [name]. LEFT HEART CATHETERIZATION WITH CORONARY ANGIOGRAM N/A 12/07/2011   Procedure: LEFT HEART CATHETERIZATION WITH CORONARY ANGIOGRAM;  Surgeon: Kathleene Hazelhristopher D McAlhany, MD;  [facility];  Service: Cardiovascular;  Laterality: N/A;  . LEFT HEART CATHETERIZATION WITH CORONARY ANGIOGRAM N/A 01/14/2012   Procedure: LEFT HEART CATHETERIZATION WITH CORONARY ANGIOGRAM;  Surgeon: Kathleene Hazelhristopher D McAlhany, MD;  [facility];  Service: Cardiovascular;  Laterality: N/A;  . MOUTH SURGERY    . NASAL SINUS SURGERY    . PERCUTANEOUS CORONARY STENT INTERVENTION (PCI-S)  01/14/2012   Procedure: PERCUTANEOUS CORONARY STENT INTERVENTION (PCI-S);  Surgeon: Kathleene Hazelhristopher D McAlhany, MD;  [facility];  Service: Cardiovascular;;     Current Meds  Medication Sig  . aspirin 81 MG EC tablet Take 1 tablet (81 mg total) by mouth daily.  [name]. atorvastatin (LIPITOR) 80 MG tablet Take 1 tablet (80 mg total) by mouth daily. Please make yearly appt with Dr. Clifton JamesMcalhany for August before anymore refills. 1st attempt  . clopidogrel (PLAVIX) 75 MG tablet TAKE 1 TABLET BY MOUTH EVERY DAY  . metoprolol tartrate (LOPRESSOR) 25 MG  tablet Take 1 tablet (25 mg total) by mouth 2 (two) times daily. 3  . nitroGLYCERIN (NITROSTAT) 0.4 MG SL tablet Place 1 tablet (0.4 mg total) under the tongue every 5 (five) minutes as needed for chest pain (up to 3 doses).  . ranolazine (RANEXA) 500 MG 12 hr tablet Take 1 tablet (500 mg total) by mouth 2 (two) times daily. Please make yearly appt with Dr. Clifton JamesMcalhany for August before anymore refills. 1st attempt  . [DISCONTINUED] amLODipine (NORVASC) 5 MG tablet Take 1 tablet (5 mg total) by mouth daily.     Allergies:   Patient has no known allergies.   Social History   Tobacco Use  . Smoking status: Former Smoker    Packs/day: 0.50    Years: 25.00    Pack years: 12.50    Types: Cigarettes    Quit date: 12/07/2003    Years since quitting: 15.2  . Smokeless tobacco: Never Used  Substance Use Topics  .  Alcohol use: No  . Drug use: No     Family Hx: The patient's family history includes Crohn's disease in his father; Hypertension in his father and mother.  ROS:   Please see the history of present illness.     All other systems reviewed and are negative.   Prior CV studies:   The following studies were reviewed today:  2D echo 12/08/2016 Study Conclusions  - Left ventricle: The cavity size was normal. Systolic function was   normal. The estimated ejection fraction was in the range of 60%   to 65%. Wall motion was normal; there were no regional wall   motion abnormalities. Left ventricular diastolic function   parameters were normal. Doppler parameters are consistent with   indeterminate ventricular filling pressure. - Aortic valve: Transvalvular velocity was within the normal range.   There was no stenosis. There was no regurgitation. - Mitral valve: Transvalvular velocity was within the normal range.   There was no evidence for stenosis. There was trivial   regurgitation. - Right ventricle: The cavity size was normal. Wall thickness was   normal. Systolic function was  normal. - Tricuspid valve: There was trivial regurgitation. - Pulmonary arteries: Systolic pressure was within the normal   range. PA peak pressure: 22 mm Hg (S).  Nuclear stress test 12/08/2016 Study Highlights   Nuclear stress EF: 68%. The left ventricular ejection fraction is hyperdynamic (>65%).  The study is normal.  This is a low risk study.       Labs/Other Tests and Data Reviewed:    EKG:  An ECG dated 03/20/2018 was personally reviewed today and demonstrated:  Normal sinus rhythm, 75 bpm, no ischemic abnormalities  Recent Labs: No results found for requested labs within last 8760 hours.   Recent Lipid Panel Lab Results  Component Value Date/Time   CHOL 134 03/20/2018 04:45 PM   TRIG 87 03/20/2018 04:45 PM   HDL 38 (L) 03/20/2018 04:45 PM   CHOLHDL 3.5 03/20/2018 04:45 PM   CHOLHDL 4 09/21/2013 08:36 AM   LDLCALC 79 03/20/2018 04:45 PM    Wt Readings from Last 3 Encounters:  03/21/19 192 lb (87.1 kg)  03/20/18 200 lb 9.6 oz (91 kg)  12/13/16 196 lb 4 oz (89 kg)     Objective:    Vital Signs:  Ht 5\' 10"  (1.778 m)   Wt 192 lb (87.1 kg)   BMI 27.55 kg/m    Physical exam: Well sounding male speaking in clear complete sentences.  Speech unlabored.  Breathing unlabored.  No acute distress.  ASSESSMENT & PLAN:    1. CAD:  He had CABG in May 2013 and then readmitted June 2013 with NSTEMI. The SVG to OM system was occluded and a drug eluting stent was placed in the native Circumflex.  He had low risk nuclear stress test in 2014 and again in 2018.  He denies any anginal symptomatology.  Continue medical therapy, with aspirin, Plavix, statin and beta-blocker.  2.  Hyperlipidemia: LDL goal in the setting of known coronary artery disease is less than 70 mg/dL.  He reports full compliance with his statin regimen.  He is past due for updated lipid panel.  We will order fasting lipid panel hepatic function test.  3.  Chronic fatigue:   He denies any associated chest  pain.  No dyspnea.  Low risk nuclear stress test in 2018 and normal echocardiogram in 2018.  No valvular abnormalities at that time.  He is on dual antiplatelet  therapy with aspirin and Plavix but denies any abnormal bleeding.  No melena or hematochezia however he is 63 years old and has never had a colonoscopy.  We will check basic laboratory work today including CBC to w/o anemia, basic metabolic panel, hepatic function test and TSH.  Also question if his chronic fatigue may be medication induced/related to beta-blocker therapy.  Unfortunately he does not have a home blood pressure monitor so we do not have baseline assessment of his heart rate and blood pressure.  When he comes in for his laboratory work, we will arrange RN visit for blood pressure check, pulse check and EKG.  At that time, I will consider stopping metoprolol and possible change to another agent. He also endorses history of snoring but has never had a sleep study. If symptoms persists and no cause later identified, would recommend sleep study evaluation.    COVID-19 Education: The signs and symptoms of COVID-19 were discussed with the patient and how to seek care for testing (follow up with PCP or arrange E-visit).  The importance of social distancing was discussed today.  Time:   Today, I have spent 15 minutes with the patient with telehealth technology discussing the above problems.     Medication Adjustments/Labs and Tests Ordered: Current medicines are reviewed at length with the patient today.  Concerns regarding medicines are outlined above.   Tests Ordered: Orders Placed This Encounter  Procedures  . CBC  . Basic Metabolic Panel (BMET)  . TSH  . Lipid Profile  . Hepatic function panel    Medication Changes: No orders of the defined types were placed in this encounter.   Follow Up:  RN visit in 1 week. 1 year w/ Dr. Clifton JamesMcAlhany  Signed, Robbie LisBrittainy Simmons, PA-C  03/21/2019 12:31 PM    [facility]

## 2019-03-21 NOTE — Telephone Encounter (Signed)
I spoke to the patient and we will refill his Amlodipine and wait on the Metoprolol until after the nurse visit.

## 2019-03-28 ENCOUNTER — Other Ambulatory Visit: Payer: Self-pay

## 2019-03-28 ENCOUNTER — Other Ambulatory Visit: Payer: BC Managed Care – PPO | Admitting: *Deleted

## 2019-03-28 ENCOUNTER — Ambulatory Visit (INDEPENDENT_AMBULATORY_CARE_PROVIDER_SITE_OTHER): Payer: BC Managed Care – PPO | Admitting: *Deleted

## 2019-03-28 VITALS — BP 122/78 | HR 58 | Ht 70.0 in | Wt 193.0 lb

## 2019-03-28 DIAGNOSIS — I214 Non-ST elevation (NSTEMI) myocardial infarction: Secondary | ICD-10-CM | POA: Diagnosis not present

## 2019-03-28 DIAGNOSIS — I2510 Atherosclerotic heart disease of native coronary artery without angina pectoris: Secondary | ICD-10-CM | POA: Diagnosis not present

## 2019-03-28 DIAGNOSIS — I10 Essential (primary) hypertension: Secondary | ICD-10-CM | POA: Diagnosis not present

## 2019-03-28 DIAGNOSIS — R5383 Other fatigue: Secondary | ICD-10-CM

## 2019-03-28 DIAGNOSIS — Z79899 Other long term (current) drug therapy: Secondary | ICD-10-CM

## 2019-03-28 DIAGNOSIS — Z9861 Coronary angioplasty status: Secondary | ICD-10-CM | POA: Diagnosis not present

## 2019-03-28 LAB — BASIC METABOLIC PANEL
BUN/Creatinine Ratio: 14 (ref 10–24)
BUN: 19 mg/dL (ref 8–27)
CO2: 22 mmol/L (ref 20–29)
Calcium: 10 mg/dL (ref 8.6–10.2)
Chloride: 105 mmol/L (ref 96–106)
Creatinine, Ser: 1.35 mg/dL — ABNORMAL HIGH (ref 0.76–1.27)
GFR calc Af Amer: 64 mL/min/{1.73_m2} (ref >59)
GFR calc non Af Amer: 55 mL/min/{1.73_m2} — ABNORMAL LOW (ref >59)
Glucose: 115 mg/dL — ABNORMAL HIGH (ref 65–99)
Potassium: 4.5 mmol/L (ref 3.5–5.2)
Sodium: 141 mmol/L (ref 134–144)

## 2019-03-28 LAB — CBC
Hematocrit: 43.8 % (ref 37.5–51.0)
Hemoglobin: 15.5 g/dL (ref 13.0–17.7)
MCH: 31.1 pg (ref 26.6–33.0)
MCHC: 35.4 g/dL (ref 31.5–35.7)
MCV: 88 fL (ref 79–97)
Platelets: 192 10*3/uL (ref 150–450)
RBC: 4.99 x10E6/uL (ref 4.14–5.80)
RDW: 13.1 % (ref 11.6–15.4)
WBC: 6.9 10*3/uL (ref 3.4–10.8)

## 2019-03-28 LAB — HEPATIC FUNCTION PANEL
ALT: 18 IU/L (ref 0–44)
AST: 17 IU/L (ref 0–40)
Albumin: 4.6 g/dL (ref 3.8–4.8)
Alkaline Phosphatase: 82 IU/L (ref 39–117)
Bilirubin Total: 0.6 mg/dL (ref 0.0–1.2)
Bilirubin, Direct: 0.17 mg/dL (ref 0.00–0.40)
Total Protein: 6.5 g/dL (ref 6.0–8.5)

## 2019-03-28 LAB — TSH: TSH: 2.99 u[IU]/mL (ref 0.450–4.500)

## 2019-03-28 LAB — LIPID PANEL
Chol/HDL Ratio: 3.7 ratio (ref 0.0–5.0)
Cholesterol, Total: 129 mg/dL (ref 100–199)
HDL: 35 mg/dL — ABNORMAL LOW (ref >39)
LDL Calculated: 73 mg/dL (ref 0–99)
Triglycerides: 104 mg/dL (ref 0–149)
VLDL Cholesterol Cal: 21 mg/dL (ref 5–40)

## 2019-03-28 NOTE — Progress Notes (Signed)
Pt came in for B/P HR and EKG per Ellen Henri Dr Copper reviewed VS and Ekg Continue as planned no changes ./cy

## 2019-03-29 ENCOUNTER — Telehealth: Payer: Self-pay

## 2019-03-29 NOTE — Telephone Encounter (Signed)
Notes recorded by Frederik Schmidt, RN on 03/29/2019 at 8:36 AM EDT  Lpm with results  ------

## 2019-03-29 NOTE — Telephone Encounter (Signed)
-----   Message from [place], [place] sent at 03/28/2019  7:53 PM EDT ----- His labs suggest mild dehydration but no other abnormalties to explain his fatigue. I recommend that he try to increase fluid intake and to try to stay well hydrated.

## 2019-04-08 ENCOUNTER — Other Ambulatory Visit: Payer: Self-pay | Admitting: Cardiovascular Disease

## 2019-04-08 DIAGNOSIS — E785 Hyperlipidemia, unspecified: Secondary | ICD-10-CM

## 2019-04-08 DIAGNOSIS — I2510 Atherosclerotic heart disease of native coronary artery without angina pectoris: Secondary | ICD-10-CM

## 2019-05-11 ENCOUNTER — Other Ambulatory Visit: Payer: Self-pay | Admitting: Cardiovascular Disease

## 2019-05-11 DIAGNOSIS — I2510 Atherosclerotic heart disease of native coronary artery without angina pectoris: Secondary | ICD-10-CM

## 2019-05-11 DIAGNOSIS — E785 Hyperlipidemia, unspecified: Secondary | ICD-10-CM

## 2019-05-23 ENCOUNTER — Other Ambulatory Visit: Payer: Self-pay | Admitting: Cardiovascular Disease

## 2019-05-23 DIAGNOSIS — I2510 Atherosclerotic heart disease of native coronary artery without angina pectoris: Secondary | ICD-10-CM

## 2019-05-23 DIAGNOSIS — E785 Hyperlipidemia, unspecified: Secondary | ICD-10-CM

## 2019-05-26 ENCOUNTER — Other Ambulatory Visit: Payer: Self-pay | Admitting: Cardiovascular Disease

## 2019-05-26 DIAGNOSIS — E785 Hyperlipidemia, unspecified: Secondary | ICD-10-CM

## 2019-05-26 DIAGNOSIS — I2510 Atherosclerotic heart disease of native coronary artery without angina pectoris: Secondary | ICD-10-CM

## 2019-08-28 DIAGNOSIS — R000 Tachycardia, unspecified: Secondary | ICD-10-CM

## 2019-08-28 DIAGNOSIS — R5383 Other fatigue: Secondary | ICD-10-CM

## 2019-08-28 DIAGNOSIS — R42 Dizziness and giddiness: Secondary | ICD-10-CM

## 2019-08-29 NOTE — Telephone Encounter (Signed)
Sent message to patient stating that a sleep study has been ordered. Will send message to sleep pool. Patient has up coming appointment with Ronie Spies in March.

## 2019-10-02 ENCOUNTER — Ambulatory Visit: Payer: BC Managed Care – PPO | Admitting: Physician Assistant

## 2019-10-08 NOTE — Progress Notes (Signed)
Cardiology Office Note    Date:  10/10/2019   ID:  Spears, Michael 03/21/1956, MRN [id number]  PCP:  [name], MD  Cardiologist: Verne Carrow, MD EPS: None  Chief Complaint  Patient presents with  . Follow-up    History of Present Illness:  Michael Spears is a 64 y.o. male with history of CAD status post CABG times 11/2011 LIMA to the LAD, SVG to OM1 and OM 2, SVG to PDA, NSTEMI 01/2012 SVG to the OM occluded and DES placed to the native circumflex.  Echo 01/2012 moderate LVH LVEF 65 to 70%.  Also has hypertension and HLD.  Angina over the years treated with Ranexa.  History of dizziness Holter in the past with rare PVCs and PACs.  Stress test 12/2012 for fatigue and dizziness no ischemia.  Dyspnea and fatigue 2018 NST no ischemia and echo showed normal LV function.  Patient saw Boyce Medici, PA-C 03/21/2019 at which time he complained of chronic fatigue.  He endorsed snoring and sleep study recommended but he said he could not afford it.  Labs at that time were stable creatinine up a little at 1.35, LDL 73, TSH normal.  Patient comes in for 6 month f/u. Has occasional angina if working in the yard or when he's stressed. Eases spontaneously and hasn't used NTG recently. Walks his dog daily-40 min without chest pain.Not sure about the covid19 vaccine.  Past Medical History:  Diagnosis Date  . Chest pain    Imdur added during admxn 7/13=> intol 2/2 HA (stopped): Norvasc added 8/13;  Ranexa 500 bid added 8/13 (QTc stable) => CP much improved  . Chronic neck pain   . Coronary artery disease    s/p 4V CABG 12/10/11 (LIMA to LAD, sequential SVG to OM1 & OM2, SVG to PDA);  NSTEMI 01/13/12:  LHC 01/14/12: pLAD 40%, mLAD occluded, Dx 40%, pCFX 90%, OM1 small and occluded at the ostium, OM2 small with 99% stenosis, proximal and mid RCA 50%, mRCA occluded, S-OM1 and OM2 occluded, S-PDA okay, LIMA-LAD okay, EF 60%.  PCI: Promus DES to the pCFX    . Cough   . Fibromyalgia   .  Headache(784.0)   . History of tobacco abuse    0.5 packs/day for 25 years, quit 2005  . HLD (hyperlipidemia)   . Hypertension   . Pericardial effusion    echo 12/29/11 small to moderate pericardial effusion primarily posteriorly and anterolaterally; echo 01/14/12: Mild LVH, EF 65%, moderate pericardial effusion circumferential to the heart with no evidence of Tamponade-similar to 12/29/11 ; trivial effusion by echo 7/13  . Pneumonia    HX OF pna    Past Surgical History:  Procedure Laterality Date  . APPENDECTOMY    . CORONARY ARTERY BYPASS GRAFT  12/10/2011   Procedure: CORONARY ARTERY BYPASS GRAFTING (CABG);  Surgeon: Delight Ovens, MD;  Location: [facility] OR;  Service: Open Heart Surgery;  Laterality: N/A;  Times four  using endoscopically harvested right greater saphenous and left internal mammary artery.  [name] LEFT HEART CATHETERIZATION WITH CORONARY ANGIOGRAM N/A 12/07/2011   Procedure: LEFT HEART CATHETERIZATION WITH CORONARY ANGIOGRAM;  Surgeon: [name], MD;  Location: [facility] CATH LAB;  Service: Cardiovascular;  Laterality: N/A;  . LEFT HEART CATHETERIZATION WITH CORONARY ANGIOGRAM N/A 01/14/2012   Procedure: LEFT HEART CATHETERIZATION WITH CORONARY ANGIOGRAM;  Surgeon: [name], MD;  Location: [facility] CATH LAB;  Service: Cardiovascular;  Laterality: N/A;  . MOUTH SURGERY    . NASAL  SINUS SURGERY    . PERCUTANEOUS CORONARY STENT INTERVENTION (PCI-S)  01/14/2012   Procedure: PERCUTANEOUS CORONARY STENT INTERVENTION (PCI-S);  Surgeon: Burnell Blanks, MD;  Location: [facility] CATH LAB;  Service: Cardiovascular;;    Current Medications: Current Meds  Medication Sig  . amLODipine (NORVASC) 5 MG tablet TAKE 1 TABLET BY MOUTH EVERY DAY  . aspirin 81 MG EC tablet Take 1 tablet (81 mg total) by mouth daily.  [name] atorvastatin (LIPITOR) 80 MG tablet TAKE 1 TABLET BY MOUTH EVERY DAY  . clopidogrel (PLAVIX) 75 MG tablet Take 1 tablet (75 mg total) by mouth daily.  . metoprolol  tartrate (LOPRESSOR) 25 MG tablet TAKE 1 TABLET (25 MG TOTAL) BY MOUTH 2 (TWO) TIMES DAILY. 3  . nitroGLYCERIN (NITROSTAT) 0.4 MG SL tablet Place 1 tablet (0.4 mg total) under the tongue every 5 (five) minutes as needed for chest pain (up to 3 doses).  . ranolazine (RANEXA) 500 MG 12 hr tablet Take 1 tablet (500 mg total) by mouth 2 (two) times daily.     Allergies:   Patient has no known allergies.   Social History   Socioeconomic History  . Marital status: Divorced    Spouse name: Not on file  . Number of children: Not on file  . Years of education: Not on file  . Highest education level: Not on file  Occupational History  . Not on file  Tobacco Use  . Smoking status: Former Smoker    Packs/day: 0.50    Years: 25.00    Pack years: 12.50    Types: Cigarettes    Quit date: 12/07/2003    Years since quitting: 15.8  . Smokeless tobacco: Never Used  Substance and Sexual Activity  . Alcohol use: No  . Drug use: No  . Sexual activity: Yes  Other Topics Concern  . Not on file  Social History Narrative  . Not on file   Social Determinants of Health   Financial Resource Strain:   . Difficulty of Paying Living Expenses: Not on file  Food Insecurity:   . Worried About Charity fundraiser in the Last Year: Not on file  . Ran Out of Food in the Last Year: Not on file  Transportation Needs:   . Lack of Transportation (Medical): Not on file  . Lack of Transportation (Non-Medical): Not on file  Physical Activity:   . Days of Exercise per Week: Not on file  . Minutes of Exercise per Session: Not on file  Stress:   . Feeling of Stress : Not on file  Social Connections:   . Frequency of Communication with Friends and Family: Not on file  . Frequency of Social Gatherings with Friends and Family: Not on file  . Attends Religious Services: Not on file  . Active Member of Clubs or Organizations: Not on file  . Attends Archivist Meetings: Not on file  . Marital Status: Not  on file     Family History:  The patient's   family history includes Crohn's disease in his father; Hypertension in his father and mother.   ROS:   Please see the history of present illness.    ROS All other systems reviewed and are negative.   PHYSICAL EXAM:   VS:  BP 110/68   Pulse 65   Ht 5\' 10"  (1.778 m)   Wt 198 lb 6.4 oz (90 kg)   SpO2 95%   BMI 28.47 kg/m   Physical Exam  GEN: Well nourished, well developed, in no acute distress  Neck: no JVD, carotid bruits, or masses Cardiac:RRR; no murmurs, rubs, or gallops  Respiratory:  clear to auscultation bilaterally, normal work of breathing GI: soft, nontender, nondistended, + BS Ext: without cyanosis, clubbing, or edema, Good distal pulses bilaterally Neuro:  Alert and Oriented x 3 Psych: euthymic mood, full affect  Wt Readings from Last 3 Encounters:  10/10/19 198 lb 6.4 oz (90 kg)  03/28/19 193 lb (87.5 kg)  03/21/19 192 lb (87.1 kg)      Studies/Labs Reviewed:   EKG:  EKG is not ordered today.   Recent Labs: 03/28/2019: ALT 18; BUN 19; Creatinine, Ser 1.35; Hemoglobin 15.5; Platelets 192; Potassium 4.5; Sodium 141; TSH 2.990   Lipid Panel    Component Value Date/Time   CHOL 129 03/28/2019 1039   TRIG 104 03/28/2019 1039   HDL 35 (L) 03/28/2019 1039   CHOLHDL 3.7 03/28/2019 1039   CHOLHDL 4 09/21/2013 0836   VLDL 39.2 09/21/2013 0836   LDLCALC 73 03/28/2019 1039    Additional studies/ records that were reviewed today include:  2D echo 12/08/2016  Study Conclusions   - Left ventricle: The cavity size was normal. Systolic function was    normal. The estimated ejection fraction was in the range of 60%    to 65%. Wall motion was normal; there were no regional wall    motion abnormalities. Left ventricular diastolic function    parameters were normal. Doppler parameters are consistent with    indeterminate ventricular filling pressure.  - Aortic valve: Transvalvular velocity was within the normal range.     There was no stenosis. There was no regurgitation.  - Mitral valve: Transvalvular velocity was within the normal range.    There was no evidence for stenosis. There was trivial    regurgitation.  - Right ventricle: The cavity size was normal. Wall thickness was    normal. Systolic function was normal.  - Tricuspid valve: There was trivial regurgitation.  - Pulmonary arteries: Systolic pressure was within the normal    range. PA peak pressure: 22 mm Hg (S).   NST 12/08/2016  Nuclear stress EF: 68%. The left ventricular ejection fraction is hyperdynamic (>65%).  The study is normal.  This is a low risk study.     ASSESSMENT:    1. Coronary artery disease involving coronary bypass graft of native heart without angina pectoris   2. Essential hypertension   3. Hyperlipidemia, unspecified hyperlipidemia type      PLAN:  In order of problems listed above:   CAD status post CABG times 11/2011 LIMA to the LAD, SVG to OM1 and OM 2, SVG to PDA, NSTEMI 01/2012 SVG to the OM occluded and DES placed to the native circumflex.  Echo 2018 LVEF 60 to 65% with diastolic dysfunction- some angina but hasn't had to use NTG. Overall stable angina  Essential hypertension BP controlled  Hyperlipidemia check FLP today    Medication Adjustments/Labs and Tests Ordered: Current medicines are reviewed at length with the patient today.  Concerns regarding medicines are outlined above.  Medication changes, Labs and Tests ordered today are listed in the Patient Instructions below. Patient Instructions  Medication Instructions:  Your physician recommends that you continue on your current medications as directed. Please refer to the Current Medication list given to you today.  *If you need a refill on your cardiac medications before your next appointment, please call your pharmacy*   Lab Work: TODAY: CMET,  LIPIDS, CBC  If you have labs (blood work) drawn today and your tests are completely normal, you  will receive your results only by: [name] MyChart Message (if you have MyChart) OR . A paper copy in the mail If you have any lab test that is abnormal or we need to change your treatment, we will call you to review the results.   Testing/Procedures: None ordered   Follow-Up: At [facility], you and your health needs are our priority.  As part of our continuing mission to provide you with exceptional heart care, we have created designated Provider Care Teams.  These Care Teams include your primary Cardiologist (physician) and Advanced Practice Providers (APPs -  Physician Assistants and Nurse Practitioners) who all work together to provide you with the care you need, when you need it.  We recommend signing up for the patient portal called "MyChart".  Sign up information is provided on this After Visit Summary.  MyChart is used to connect with patients for Virtual Visits (Telemedicine).  Patients are able to view lab/test results, encounter notes, upcoming appointments, etc.  Non-urgent messages can be sent to your provider as well.   To learn more about what you can do with MyChart, go to [email or web address].    Your next appointment:   6 month(s)  The format for your next appointment:   In Person  Provider:   You may see Verne Carrow, MD or one of the following Advanced Practice Providers on your designated Care Team:    Ronie Spies, PA-C  Jacolyn Reedy, PA-C    Other Instructions  We are recommending the COVID-19 vaccine to all of our patients. Cardiac medications (including blood thinners) should not deter anyone from being vaccinated and there is no need to hold any of those medications prior to vaccine administration.   Currently, there is a hotline to call (active 08/10/19) to schedule vaccination appointments as no walk-ins will be accepted.    Vaccines through the health department can be arranged by calling [phone]    Vaccines through [facility] can be arranged  by calling [phone] or visiting [email or web address]  Vaccines can also be done by going to [email or web address] to schedule an appointment   If you have further questions or concerns about the vaccine process, please visit [email or web address], [email or web address], or contact your primary care physician.        Elson Clan, PA-C  10/10/2019 9:42 AM    [facility] [address] [place], [place], [place]  [address] Phone: [phone]; Fax: [phone]

## 2019-10-10 ENCOUNTER — Encounter: Payer: Self-pay | Admitting: Physician Assistant

## 2019-10-10 ENCOUNTER — Other Ambulatory Visit: Payer: Self-pay

## 2019-10-10 ENCOUNTER — Ambulatory Visit: Payer: BC Managed Care – PPO | Admitting: Physician Assistant

## 2019-10-10 VITALS — BP 110/68 | HR 65 | Ht 70.0 in | Wt 198.4 lb

## 2019-10-10 DIAGNOSIS — I10 Essential (primary) hypertension: Secondary | ICD-10-CM

## 2019-10-10 DIAGNOSIS — E785 Hyperlipidemia, unspecified: Secondary | ICD-10-CM

## 2019-10-10 DIAGNOSIS — I25810 Atherosclerosis of coronary artery bypass graft(s) without angina pectoris: Secondary | ICD-10-CM

## 2019-10-10 LAB — COMPREHENSIVE METABOLIC PANEL
ALT: 20 IU/L (ref 0–44)
AST: 23 IU/L (ref 0–40)
Albumin/Globulin Ratio: 3.1 — ABNORMAL HIGH (ref 1.2–2.2)
Albumin: 4.7 g/dL (ref 3.8–4.8)
Alkaline Phosphatase: 84 IU/L (ref 39–117)
BUN/Creatinine Ratio: 11 (ref 10–24)
BUN: 15 mg/dL (ref 8–27)
Bilirubin Total: 0.7 mg/dL (ref 0.0–1.2)
CO2: 23 mmol/L (ref 20–29)
Calcium: 9.9 mg/dL (ref 8.6–10.2)
Chloride: 105 mmol/L (ref 96–106)
Creatinine, Ser: 1.36 mg/dL — ABNORMAL HIGH (ref 0.76–1.27)
GFR calc Af Amer: 63 mL/min/{1.73_m2} (ref >59)
GFR calc non Af Amer: 55 mL/min/{1.73_m2} — ABNORMAL LOW (ref >59)
Globulin, Total: 1.5 g/dL (ref 1.5–4.5)
Glucose: 100 mg/dL — ABNORMAL HIGH (ref 65–99)
Potassium: 4.7 mmol/L (ref 3.5–5.2)
Sodium: 140 mmol/L (ref 134–144)
Total Protein: 6.2 g/dL (ref 6.0–8.5)

## 2019-10-10 LAB — LIPID PANEL
Chol/HDL Ratio: 3.2 ratio (ref 0.0–5.0)
Cholesterol, Total: 123 mg/dL (ref 100–199)
HDL: 39 mg/dL — ABNORMAL LOW (ref >39)
LDL Chol Calc (NIH): 68 mg/dL (ref 0–99)
Triglycerides: 79 mg/dL (ref 0–149)
VLDL Cholesterol Cal: 16 mg/dL (ref 5–40)

## 2019-10-10 LAB — CBC
Hematocrit: 45.3 % (ref 37.5–51.0)
Hemoglobin: 15.9 g/dL (ref 13.0–17.7)
MCH: 30.6 pg (ref 26.6–33.0)
MCHC: 35.1 g/dL (ref 31.5–35.7)
MCV: 87 fL (ref 79–97)
Platelets: 186 10*3/uL (ref 150–450)
RBC: 5.19 x10E6/uL (ref 4.14–5.80)
RDW: 12.8 % (ref 11.6–15.4)
WBC: 6.5 10*3/uL (ref 3.4–10.8)

## 2019-10-10 NOTE — Patient Instructions (Signed)
Medication Instructions:  Your physician recommends that you continue on your current medications as directed. Please refer to the Current Medication list given to you today.  *If you need a refill on your cardiac medications before your next appointment, please call your pharmacy*   Lab Work: TODAY: CMET, LIPIDS, CBC  If you have labs (blood work) drawn today and your tests are completely normal, you will receive your results only by: [name] MyChart Message (if you have MyChart) OR . A paper copy in the mail If you have any lab test that is abnormal or we need to change your treatment, we will call you to review the results.   Testing/Procedures: None ordered   Follow-Up: At [facility], you and your health needs are our priority.  As part of our continuing mission to provide you with exceptional heart care, we have created designated Provider Care Teams.  These Care Teams include your primary Cardiologist (physician) and Advanced Practice Providers (APPs -  Physician Assistants and Nurse Practitioners) who all work together to provide you with the care you need, when you need it.  We recommend signing up for the patient portal called "MyChart".  Sign up information is provided on this After Visit Summary.  MyChart is used to connect with patients for Virtual Visits (Telemedicine).  Patients are able to view lab/test results, encounter notes, upcoming appointments, etc.  Non-urgent messages can be sent to your provider as well.   To learn more about what you can do with MyChart, go to [email or web address].    Your next appointment:   6 month(s)  The format for your next appointment:   In Person  Provider:   You may see Verne Carrow, MD or one of the following Advanced Practice Providers on your designated Care Team:    Ronie Spies, PA-C  Jacolyn Reedy, PA-C    Other Instructions  We are recommending the COVID-19 vaccine to all of our patients. Cardiac medications  (including blood thinners) should not deter anyone from being vaccinated and there is no need to hold any of those medications prior to vaccine administration.   Currently, there is a hotline to call (active 08/10/19) to schedule vaccination appointments as no walk-ins will be accepted.    Vaccines through the health department can be arranged by calling [phone]    Vaccines through [facility] can be arranged by calling [phone] or visiting [email or web address]  Vaccines can also be done by going to [email or web address] to schedule an appointment   If you have further questions or concerns about the vaccine process, please visit [email or web address], [email or web address], or contact your primary care physician.

## 2020-02-23 ENCOUNTER — Other Ambulatory Visit: Payer: Self-pay | Admitting: Cardiovascular Disease

## 2020-02-23 DIAGNOSIS — E785 Hyperlipidemia, unspecified: Secondary | ICD-10-CM

## 2020-02-23 DIAGNOSIS — I2510 Atherosclerotic heart disease of native coronary artery without angina pectoris: Secondary | ICD-10-CM

## 2020-03-02 DIAGNOSIS — U071 COVID-19: Secondary | ICD-10-CM

## 2020-03-02 DIAGNOSIS — J9601 Acute respiratory failure with hypoxia: Secondary | ICD-10-CM

## 2020-03-02 HISTORY — DX: Acute respiratory failure with hypoxia: J96.01

## 2020-03-02 HISTORY — DX: COVID-19: U07.1

## 2020-03-05 DIAGNOSIS — R52 Pain, unspecified: Secondary | ICD-10-CM | POA: Diagnosis not present

## 2020-03-05 DIAGNOSIS — Z20822 Contact with and (suspected) exposure to covid-19: Secondary | ICD-10-CM | POA: Diagnosis not present

## 2020-03-14 DIAGNOSIS — U071 COVID-19: Secondary | ICD-10-CM | POA: Diagnosis not present

## 2020-03-17 ENCOUNTER — Emergency Department (HOSPITAL_COMMUNITY): Payer: BC Managed Care – PPO

## 2020-03-17 ENCOUNTER — Inpatient Hospital Stay (HOSPITAL_COMMUNITY)
Admission: EM | Admit: 2020-03-17 | Discharge: 2020-03-25 | DRG: 177 | Disposition: A | Discharge status: Home Health | Payer: BC Managed Care – PPO | Attending: Internal Medicine | Admitting: Internal Medicine

## 2020-03-17 ENCOUNTER — Encounter (HOSPITAL_COMMUNITY): Payer: Self-pay | Admitting: Internal Medicine

## 2020-03-17 DIAGNOSIS — Z87891 Personal history of nicotine dependence: Secondary | ICD-10-CM

## 2020-03-17 DIAGNOSIS — M797 Fibromyalgia: Secondary | ICD-10-CM | POA: Diagnosis present

## 2020-03-17 DIAGNOSIS — U071 COVID-19: Secondary | ICD-10-CM | POA: Diagnosis not present

## 2020-03-17 DIAGNOSIS — I10 Essential (primary) hypertension: Secondary | ICD-10-CM | POA: Diagnosis present

## 2020-03-17 DIAGNOSIS — J9601 Acute respiratory failure with hypoxia: Secondary | ICD-10-CM | POA: Diagnosis not present

## 2020-03-17 DIAGNOSIS — Z8249 Family history of ischemic heart disease and other diseases of the circulatory system: Secondary | ICD-10-CM | POA: Diagnosis not present

## 2020-03-17 DIAGNOSIS — I4510 Unspecified right bundle-branch block: Secondary | ICD-10-CM | POA: Diagnosis not present

## 2020-03-17 DIAGNOSIS — R0902 Hypoxemia: Secondary | ICD-10-CM

## 2020-03-17 DIAGNOSIS — I2510 Atherosclerotic heart disease of native coronary artery without angina pectoris: Secondary | ICD-10-CM | POA: Diagnosis not present

## 2020-03-17 DIAGNOSIS — I313 Pericardial effusion (noninflammatory): Secondary | ICD-10-CM | POA: Diagnosis not present

## 2020-03-17 DIAGNOSIS — R630 Anorexia: Secondary | ICD-10-CM | POA: Diagnosis present

## 2020-03-17 DIAGNOSIS — E860 Dehydration: Secondary | ICD-10-CM | POA: Diagnosis not present

## 2020-03-17 DIAGNOSIS — J189 Pneumonia, unspecified organism: Secondary | ICD-10-CM | POA: Diagnosis not present

## 2020-03-17 DIAGNOSIS — J069 Acute upper respiratory infection, unspecified: Secondary | ICD-10-CM | POA: Diagnosis present

## 2020-03-17 DIAGNOSIS — I252 Old myocardial infarction: Secondary | ICD-10-CM

## 2020-03-17 DIAGNOSIS — R7989 Other specified abnormal findings of blood chemistry: Secondary | ICD-10-CM | POA: Diagnosis not present

## 2020-03-17 DIAGNOSIS — Z7982 Long term (current) use of aspirin: Secondary | ICD-10-CM

## 2020-03-17 DIAGNOSIS — J1282 Pneumonia due to coronavirus disease 2019: Secondary | ICD-10-CM | POA: Diagnosis present

## 2020-03-17 DIAGNOSIS — J80 Acute respiratory distress syndrome: Secondary | ICD-10-CM | POA: Diagnosis not present

## 2020-03-17 DIAGNOSIS — Z951 Presence of aortocoronary bypass graft: Secondary | ICD-10-CM | POA: Diagnosis not present

## 2020-03-17 DIAGNOSIS — G8929 Other chronic pain: Secondary | ICD-10-CM | POA: Diagnosis present

## 2020-03-17 DIAGNOSIS — R0600 Dyspnea, unspecified: Secondary | ICD-10-CM | POA: Diagnosis present

## 2020-03-17 DIAGNOSIS — R197 Diarrhea, unspecified: Secondary | ICD-10-CM | POA: Diagnosis present

## 2020-03-17 DIAGNOSIS — I959 Hypotension, unspecified: Secondary | ICD-10-CM | POA: Diagnosis present

## 2020-03-17 DIAGNOSIS — Z7902 Long term (current) use of antithrombotics/antiplatelets: Secondary | ICD-10-CM | POA: Diagnosis not present

## 2020-03-17 DIAGNOSIS — E785 Hyperlipidemia, unspecified: Secondary | ICD-10-CM | POA: Diagnosis present

## 2020-03-17 DIAGNOSIS — R0602 Shortness of breath: Secondary | ICD-10-CM | POA: Diagnosis not present

## 2020-03-17 DIAGNOSIS — R0609 Other forms of dyspnea: Secondary | ICD-10-CM | POA: Diagnosis present

## 2020-03-17 DIAGNOSIS — Z20822 Contact with and (suspected) exposure to covid-19: Secondary | ICD-10-CM | POA: Diagnosis present

## 2020-03-17 DIAGNOSIS — R7402 Elevation of levels of lactic acid dehydrogenase (LDH): Secondary | ICD-10-CM | POA: Diagnosis not present

## 2020-03-17 DIAGNOSIS — R21 Rash and other nonspecific skin eruption: Secondary | ICD-10-CM | POA: Diagnosis not present

## 2020-03-17 DIAGNOSIS — Z79899 Other long term (current) drug therapy: Secondary | ICD-10-CM

## 2020-03-17 DIAGNOSIS — R7982 Elevated C-reactive protein (CRP): Secondary | ICD-10-CM | POA: Diagnosis present

## 2020-03-17 LAB — CBC WITH DIFFERENTIAL/PLATELET
Abs Immature Granulocytes: 0.14 10*3/uL — ABNORMAL HIGH (ref 0.00–0.07)
Basophils Absolute: 0 10*3/uL (ref 0.0–0.1)
Basophils Relative: 0 %
Eosinophils Absolute: 0 10*3/uL (ref 0.0–0.5)
Eosinophils Relative: 0 %
HCT: 37.6 % — ABNORMAL LOW (ref 39.0–52.0)
Hemoglobin: 12.7 g/dL — ABNORMAL LOW (ref 13.0–17.0)
Immature Granulocytes: 1 %
Lymphocytes Relative: 3 %
Lymphs Abs: 0.5 10*3/uL — ABNORMAL LOW (ref 0.7–4.0)
MCH: 29.6 pg (ref 26.0–34.0)
MCHC: 33.8 g/dL (ref 30.0–36.0)
MCV: 87.6 fL (ref 80.0–100.0)
Monocytes Absolute: 0.5 10*3/uL (ref 0.1–1.0)
Monocytes Relative: 3 %
Neutro Abs: 13.9 10*3/uL — ABNORMAL HIGH (ref 1.7–7.7)
Neutrophils Relative %: 93 %
Platelets: 140 10*3/uL — ABNORMAL LOW (ref 150–400)
RBC: 4.29 MIL/uL (ref 4.22–5.81)
RDW: 12.7 % (ref 11.5–15.5)
WBC: 15 10*3/uL — ABNORMAL HIGH (ref 4.0–10.5)
nRBC: 0 % (ref 0.0–0.2)

## 2020-03-17 LAB — D-DIMER, QUANTITATIVE: D-Dimer, Quant: 3.45 ug/mL-FEU — ABNORMAL HIGH (ref 0.00–0.50)

## 2020-03-17 LAB — LACTATE DEHYDROGENASE: LDH: 229 U/L — ABNORMAL HIGH (ref 98–192)

## 2020-03-17 LAB — COMPREHENSIVE METABOLIC PANEL
ALT: 41 U/L (ref 0–44)
AST: 51 U/L — ABNORMAL HIGH (ref 15–41)
Albumin: 2.5 g/dL — ABNORMAL LOW (ref 3.5–5.0)
Alkaline Phosphatase: 58 U/L (ref 38–126)
Anion gap: 10 (ref 5–15)
BUN: 34 mg/dL — ABNORMAL HIGH (ref 8–23)
CO2: 23 mmol/L (ref 22–32)
Calcium: 8.8 mg/dL — ABNORMAL LOW (ref 8.9–10.3)
Chloride: 103 mmol/L (ref 98–111)
Creatinine, Ser: 1.27 mg/dL — ABNORMAL HIGH (ref 0.61–1.24)
GFR calc Af Amer: >60 mL/min (ref >60)
GFR calc non Af Amer: 59 mL/min — ABNORMAL LOW (ref >60)
Glucose, Bld: 102 mg/dL — ABNORMAL HIGH (ref 70–99)
Potassium: 3.5 mmol/L (ref 3.5–5.1)
Sodium: 136 mmol/L (ref 135–145)
Total Bilirubin: 1.1 mg/dL (ref 0.3–1.2)
Total Protein: 5.6 g/dL — ABNORMAL LOW (ref 6.5–8.1)

## 2020-03-17 LAB — FIBRINOGEN: Fibrinogen: >800 mg/dL — ABNORMAL HIGH (ref 210–475)

## 2020-03-17 LAB — PROCALCITONIN: Procalcitonin: 0.56 ng/mL

## 2020-03-17 LAB — LACTIC ACID, PLASMA: Lactic Acid, Venous: 1.7 mmol/L (ref 0.5–1.9)

## 2020-03-17 LAB — TRIGLYCERIDES: Triglycerides: 202 mg/dL — ABNORMAL HIGH (ref <150)

## 2020-03-17 LAB — TROPONIN I (HIGH SENSITIVITY)
Troponin I (High Sensitivity): 12 ng/L (ref <18)
Troponin I (High Sensitivity): 18 ng/L — ABNORMAL HIGH (ref <18)

## 2020-03-17 LAB — C-REACTIVE PROTEIN: CRP: 22.7 mg/dL — ABNORMAL HIGH (ref <1.0)

## 2020-03-17 LAB — SARS CORONAVIRUS 2 BY RT PCR (HOSPITAL ORDER, PERFORMED IN ~~LOC~~ HOSPITAL LAB): SARS Coronavirus 2: POSITIVE — AB

## 2020-03-17 LAB — FERRITIN: Ferritin: 1911 ng/mL — ABNORMAL HIGH (ref 24–336)

## 2020-03-17 MED ORDER — RANOLAZINE ER 500 MG PO TB12
500.0000 mg | ORAL_TABLET | Freq: Two times a day (BID) | ORAL | Status: DC
  Administered 2020-03-17 – 2020-03-25 (×16): 500 mg via ORAL
  Filled 2020-03-17 (×20): qty 1

## 2020-03-17 MED ORDER — ACETAMINOPHEN 325 MG PO TABS
650.0000 mg | ORAL_TABLET | Freq: Four times a day (QID) | ORAL | Status: DC | PRN
  Administered 2020-03-23 – 2020-03-25 (×4): 650 mg via ORAL
  Filled 2020-03-17 (×3): qty 2

## 2020-03-17 MED ORDER — SODIUM CHLORIDE 0.9 % IV SOLN
200.0000 mg | Freq: Once | INTRAVENOUS | Status: AC
  Administered 2020-03-17: 200 mg via INTRAVENOUS
  Filled 2020-03-17: qty 40

## 2020-03-17 MED ORDER — SODIUM CHLORIDE 0.9 % IV SOLN: 100.0000 mg | Freq: Every day | INTRAVENOUS | Status: DC

## 2020-03-17 MED ORDER — METOPROLOL TARTRATE 25 MG PO TABS
25.0000 mg | ORAL_TABLET | Freq: Two times a day (BID) | ORAL | Status: DC
  Administered 2020-03-17 – 2020-03-25 (×15): 25 mg via ORAL
  Filled 2020-03-17 (×16): qty 1

## 2020-03-17 MED ORDER — ONDANSETRON HCL 4 MG PO TABS
4.0000 mg | ORAL_TABLET | Freq: Four times a day (QID) | ORAL | Status: DC | PRN
  Administered 2020-03-23 – 2020-03-25 (×4): 4 mg via ORAL
  Filled 2020-03-17 (×4): qty 1

## 2020-03-17 MED ORDER — NITROGLYCERIN 0.4 MG SL SUBL: 0.4000 mg | SUBLINGUAL_TABLET | SUBLINGUAL | Status: DC | PRN

## 2020-03-17 MED ORDER — ATORVASTATIN CALCIUM 80 MG PO TABS
80.0000 mg | ORAL_TABLET | Freq: Every day | ORAL | Status: DC
  Administered 2020-03-17 – 2020-03-25 (×9): 80 mg via ORAL
  Filled 2020-03-17 (×9): qty 1

## 2020-03-17 MED ORDER — CLOPIDOGREL BISULFATE 75 MG PO TABS
75.0000 mg | ORAL_TABLET | Freq: Every day | ORAL | Status: DC
  Administered 2020-03-17 – 2020-03-25 (×9): 75 mg via ORAL
  Filled 2020-03-17 (×9): qty 1

## 2020-03-17 MED ORDER — METHYLPREDNISOLONE SODIUM SUCC 125 MG IJ SOLR
0.5000 mg/kg | Freq: Two times a day (BID) | INTRAMUSCULAR | Status: DC
  Administered 2020-03-17 – 2020-03-18 (×2): 44.375 mg via INTRAVENOUS
  Filled 2020-03-17 (×2): qty 2

## 2020-03-17 MED ORDER — ALBUTEROL SULFATE HFA 108 (90 BASE) MCG/ACT IN AERS
2.0000 | INHALATION_SPRAY | Freq: Four times a day (QID) | RESPIRATORY_TRACT | Status: DC
  Administered 2020-03-17 – 2020-03-25 (×29): 2 via RESPIRATORY_TRACT
  Filled 2020-03-17 (×2): qty 6.7

## 2020-03-17 MED ORDER — ASPIRIN EC 81 MG PO TBEC
81.0000 mg | DELAYED_RELEASE_TABLET | Freq: Every day | ORAL | Status: DC
  Administered 2020-03-17 – 2020-03-25 (×9): 81 mg via ORAL
  Filled 2020-03-17 (×9): qty 1

## 2020-03-17 MED ORDER — SODIUM CHLORIDE 0.9 % IV SOLN: 200.0000 mg | Freq: Once | INTRAVENOUS | Status: DC

## 2020-03-17 MED ORDER — ENOXAPARIN SODIUM 40 MG/0.4ML ~~LOC~~ SOLN: 40.0000 mg | SUBCUTANEOUS | Status: DC

## 2020-03-17 MED ORDER — ONDANSETRON HCL 4 MG/2ML IJ SOLN: 4.0000 mg | Freq: Four times a day (QID) | INTRAMUSCULAR | Status: DC | PRN

## 2020-03-17 MED ORDER — ZINC SULFATE 220 (50 ZN) MG PO CAPS
220.0000 mg | ORAL_CAPSULE | Freq: Every day | ORAL | Status: DC
  Administered 2020-03-17 – 2020-03-25 (×9): 220 mg via ORAL
  Filled 2020-03-17 (×9): qty 1

## 2020-03-17 MED ORDER — SODIUM CHLORIDE 0.9 % IV SOLN
100.0000 mg | INTRAVENOUS | Status: AC
  Administered 2020-03-18 – 2020-03-21 (×4): 100 mg via INTRAVENOUS
  Filled 2020-03-17 (×5): qty 20

## 2020-03-17 MED ORDER — SODIUM CHLORIDE 0.9% FLUSH
3.0000 mL | INTRAVENOUS | Status: DC | PRN
  Administered 2020-03-17: 3 mL via INTRAVENOUS

## 2020-03-17 MED ORDER — TOCILIZUMAB 400 MG/20ML IV SOLN
8.0000 mg/kg | Freq: Once | INTRAVENOUS | Status: DC
  Filled 2020-03-17: qty 35.4

## 2020-03-17 MED ORDER — BARICITINIB 2 MG PO TABS
4.0000 mg | ORAL_TABLET | Freq: Every day | ORAL | Status: DC
  Administered 2020-03-17 – 2020-03-25 (×9): 4 mg via ORAL
  Filled 2020-03-17 (×11): qty 2

## 2020-03-17 MED ORDER — ASCORBIC ACID 500 MG PO TABS
500.0000 mg | ORAL_TABLET | Freq: Every day | ORAL | Status: DC
  Administered 2020-03-17 – 2020-03-25 (×9): 500 mg via ORAL
  Filled 2020-03-17 (×9): qty 1

## 2020-03-17 MED ORDER — AMLODIPINE BESYLATE 5 MG PO TABS
5.0000 mg | ORAL_TABLET | Freq: Every day | ORAL | Status: DC
  Administered 2020-03-17 – 2020-03-21 (×5): 5 mg via ORAL
  Filled 2020-03-17 (×6): qty 1

## 2020-03-17 MED ORDER — SODIUM CHLORIDE 0.9 % IV SOLN: 250.0000 mL | INTRAVENOUS | Status: DC | PRN

## 2020-03-17 MED ORDER — GUAIFENESIN-DM 100-10 MG/5ML PO SYRP
10.0000 mL | ORAL_SOLUTION | ORAL | Status: DC | PRN
  Administered 2020-03-17 – 2020-03-25 (×16): 10 mL via ORAL
  Filled 2020-03-17 (×18): qty 10

## 2020-03-17 MED ORDER — SODIUM CHLORIDE 0.9% FLUSH
3.0000 mL | Freq: Two times a day (BID) | INTRAVENOUS | Status: DC
  Administered 2020-03-18 – 2020-03-25 (×16): 3 mL via INTRAVENOUS

## 2020-03-17 NOTE — H&P (Addendum)
[facility] History and Physical  BRIGHTON PILLEY OEU:[id number] DOB: 1956/01/16 DOA: 03/17/2020  Referring physician: ED  PCP: [name], MD   Patient is coming from: Home  Chief Complaint: Shortness of breath  HPI: Michael Spears is a 64 y.o. male with past medical history of hypertension, hyperlipidemia, history of tobacco abuse, hyper myalgia, history of coronary artery disease status post CABG, history of pericardial effusion presented to hospital with complaints of shortness of breath for 1 week.  Patient did have some flulike symptoms 2 weeks back and had a Covid test done on 8/13 in [place] [facility] which was positive and was called to come to the ER on 03/16/2020.[name]  Patient has been having increasing shortness of breath and dyspnea since the last 2 to 3 days.  EMS was called in due to dyspnea and was noted to be hypoxic in the 70s and was put on 4 L of nasal cannula with increasing his saturation to 94%.  He was also noted to be mildly hypotensive and received 500 mL of fluid bolus.  Patient denied any sputum production or chest pain.  History of subjective fever at home as well.  Patient also complained of diarrhea 2-3 episodes of watery diarrhea.  He also has fatigue tiredness.  ED Course: In the ED, patient was noted to be hypoxic and was on supplemental oxygen.  EKG showed right bundle branch block.  Chest x-ray showed evidence of multifocal pneumonia.  Ferritin was elevated at 1911, CRP elevated at 22.  Lactate was 1.7 procalcitonin 0.5.  Triglyceride was elevated at 202.  Creatinine was elevated at 1.2.  LDH elevated at 229.  WBC is mildly elevated at 15.0.  D-dimer elevated at 3.4.  Fibrinogen elevated .[name]  Patient was then considered for admission to hospital for acute hypoxic respiratory failure secondary to Covid pneumonia.  Patient reported that he was unvaccinated.  Review of Systems:  All systems were reviewed and were negative unless otherwise mentioned in the  HPI  Past Medical History:  Diagnosis Date  . Chest pain    Imdur added during admxn 7/13=> intol 2/2 HA (stopped): Norvasc added 8/13;  Ranexa 500 bid added 8/13 (QTc stable) => CP much improved  . Chronic neck pain   . Coronary artery disease    s/p 4V CABG 12/10/11 (LIMA to LAD, sequential SVG to OM1 & OM2, SVG to PDA);  NSTEMI 01/13/12:  LHC 01/14/12: pLAD 40%, mLAD occluded, Dx 40%, pCFX 90%, OM1 small and occluded at the ostium, OM2 small with 99% stenosis, proximal and mid RCA 50%, mRCA occluded, S-OM1 and OM2 occluded, S-PDA okay, LIMA-LAD okay, EF 60%.  PCI: Promus DES to the pCFX    . Cough   . Fibromyalgia   . Headache(784.0)   . History of tobacco abuse    0.5 packs/day for 25 years, quit 2005  . HLD (hyperlipidemia)   . Hypertension   . Pericardial effusion    echo 12/29/11 small to moderate pericardial effusion primarily posteriorly and anterolaterally; echo 01/14/12: Mild LVH, EF 65%, moderate pericardial effusion circumferential to the heart with no evidence of Tamponade-similar to 12/29/11 ; trivial effusion by echo 7/13  . Pneumonia    HX OF pna   Past Surgical History:  Procedure Laterality Date  . APPENDECTOMY    . CORONARY ARTERY BYPASS GRAFT  12/10/2011   Procedure: CORONARY ARTERY BYPASS GRAFTING (CABG);  Surgeon: Delight Ovens, MD;  Location[facility];  Service: Open Heart Surgery;  Laterality: N/A;  Times four  using endoscopically harvested right greater saphenous and left internal mammary artery.  . LEFT HEART CATHETERIZATION WITH CORONARY ANGIOGRAM N/A 12/07/2011   Procedure: LEFT HEART CATHETERIZATION WITH CORONARY ANGIOGRAM;  Surgeon: Kathleene Hazelhristopher D McAlhany, MD;  [facility];  Service: Cardiovascular;  Laterality: N/A;  . LEFT HEART CATHETERIZATION WITH CORONARY ANGIOGRAM N/A 01/14/2012   Procedure: LEFT HEART CATHETERIZATION WITH CORONARY ANGIOGRAM;  Surgeon: Kathleene Hazelhristopher D McAlhany, MD;  [facility];  Service: Cardiovascular;  Laterality: N/A;   . MOUTH SURGERY    . NASAL SINUS SURGERY    . PERCUTANEOUS CORONARY STENT INTERVENTION (PCI-S)  01/14/2012   Procedure: PERCUTANEOUS CORONARY STENT INTERVENTION (PCI-S);  Surgeon: Kathleene Hazelhristopher D McAlhany, MD;  [facility];  Service: Cardiovascular;;    Social History:  reports that he quit smoking about 16 years ago. His smoking use included cigarettes. He has a 12.50 pack-year smoking history. He has never used smokeless tobacco. He reports that he does not drink alcohol and does not use drugs.  No Known Allergies  Family History  Problem Relation Age of Onset  . Hypertension Mother   . Hypertension Father   . Crohn's disease Father      Prior to Admission medications   Medication Sig Start Date End Date Taking? Authorizing Provider  amLODipine (NORVASC) 5 MG tablet TAKE 1 TABLET BY MOUTH EVERY DAY Patient taking differently: Take 5 mg by mouth daily.  03/21/19  Yes Kathleene HazelMcAlhany, Christopher D, MD  aspirin 81 MG EC tablet Take 1 tablet (81 mg total) by mouth daily. 01/15/12  Yes Hope, Jessica A, PA-C  atorvastatin (LIPITOR) 80 MG tablet TAKE 1 TABLET BY MOUTH EVERY DAY Patient taking differently: Take 80 mg by mouth daily.  02/25/20  Yes Kathleene HazelMcAlhany, Christopher D, MD  clopidogrel (PLAVIX) 75 MG tablet Take 1 tablet (75 mg total) by mouth daily. 05/11/19  Yes Kathleene HazelMcAlhany, Christopher D, MD  metoprolol tartrate (LOPRESSOR) 25 MG tablet TAKE 1 TABLET (25 MG TOTAL) BY MOUTH 2 (TWO) TIMES DAILY. 3 Patient taking differently: Take 25 mg by mouth 2 (two) times daily.  03/21/19  Yes Kathleene HazelMcAlhany, Christopher D, MD  ranolazine (RANEXA) 500 MG 12 hr tablet Take 1 tablet (500 mg total) by mouth 2 (two) times daily. 05/23/19  Yes Kathleene HazelMcAlhany, Christopher D, MD  nitroGLYCERIN (NITROSTAT) 0.4 MG SL tablet Place 1 tablet (0.4 mg total) under the tongue every 5 (five) minutes as needed for chest pain (up to 3 doses). 03/20/18 11/25/19  Kathleene HazelMcAlhany, Christopher D, MD    Physical Exam: Vitals:   03/17/20 1537  03/17/20 1540 03/17/20 1722  BP:  116/67 110/68  Pulse:  82 74  Resp:  20 (!) 28  Temp:  97.9 F (36.6 C)   TempSrc:  Oral   SpO2:  (!) 86% 95%  Weight: 88.5 kg    Height: 5\' 10"  (1.778 m)     Wt Readings from Last 3 Encounters:  03/17/20 88.5 kg  10/10/19 90 kg  03/28/19 87.5 kg   Body mass index is 27.98 kg/m.  General: Alert awake communicative, mildly coughing, not in obvious distress, on nasal cannula oxygen HENT: Normocephalic, pupils equally reacting to light and accommodation.  No scleral pallor or icterus noted. Oral mucosa is moist.  Chest: Diminished present bilaterally.  Tachypnea.  Few coarse breath sounds noted bilaterally.  Previous CABG scar. CVS: S1 &S2 heard. No murmur.  Regular rate and rhythm. Abdomen: Soft, nontender, nondistended.  Bowel sounds are heard.  no abdominal mass palpated Extremities: No cyanosis, clubbing or edema.  Peripheral pulses are palpable. Psych: Alert, awake and oriented, normal mood CNS:  No cranial nerve deficits.  Power equal in all extremities.   No cerebellar signs.   Skin: Warm and dry.  No rashes noted.  Labs on Admission:   CBC: Recent Labs  Lab 03/17/20 1545  WBC 15.0*  NEUTROABS 13.9*  HGB 12.7*  HCT 37.6*  MCV 87.6  PLT 140*    Basic Metabolic Panel: Recent Labs  Lab 03/17/20 1545  NA 136  K 3.5  CL 103  CO2 23  GLUCOSE 102*  BUN 34*  CREATININE 1.27*  CALCIUM 8.8*    Liver Function Tests: Recent Labs  Lab 03/17/20 1545  AST 51*  ALT 41  ALKPHOS 58  BILITOT 1.1  PROT 5.6*  ALBUMIN 2.5*   No results for input(s): LIPASE, AMYLASE in the last 168 hours. No results for input(s): AMMONIA in the last 168 hours.  Cardiac Enzymes: No results for input(s): CKTOTAL, CKMB, CKMBINDEX, TROPONINI in the last 168 hours.  BNP (last 3 results) No results for input(s): BNP in the last 8760 hours.  ProBNP (last 3 results) No results for input(s): PROBNP in the last 8760 hours.  CBG: No results for  input(s): GLUCAP in the last 168 hours.  Lipase  No results found for: LIPASE   Urinalysis No results found for: COLORURINE, APPEARANCEUR, LABSPEC, PHURINE, GLUCOSEU, HGBUR, BILIRUBINUR, KETONESUR, PROTEINUR, UROBILINOGEN, NITRITE, LEUKOCYTESUR   Drugs of Abuse     Component Value Date/Time   LABOPIA NONE DETECTED 12/07/2011 0815   COCAINSCRNUR NONE DETECTED 12/07/2011 0815   LABBENZ NONE DETECTED 12/07/2011 0815   AMPHETMU NONE DETECTED 12/07/2011 0815   THCU NONE DETECTED 12/07/2011 0815   LABBARB NONE DETECTED 12/07/2011 0815      Radiological Exams on Admission: DG Chest Port 1 View  Result Date: 03/17/2020 CLINICAL DATA:  64 year old male with shortness of breath. Positive COVID-19. EXAM: PORTABLE CHEST 1 VIEW COMPARISON:  Chest radiograph dated 02/21/2012. FINDINGS: Bilateral mid to lower lung field streaky densities most consistent with multifocal pneumonia and in keeping with provided history of COVID-19. Clinical correlation and follow-up recommended. No pleural effusion pneumothorax. The cardiac silhouette is within limits. Median sternotomy wires and CABG vascular clips. No acute osseous pathology. IMPRESSION: Multifocal pneumonia. Clinical correlation and follow-up recommended. Electronically Signed   By: Elgie Collard M.D.   On: 03/17/2020 16:03    EKG: Personally reviewed by me which shows normal sinus rhythm with right bundle branch block  Assessment/Plan Principal Problem:   Pneumonia due to COVID-19 virus Active Problems:   Hyperlipidemia   S/P CABG x 04 Dec 2011   Dyspnea   Essential hypertension   Acute hypoxic respiratory failure secondary to multifocal pneumonia secondary to Covid 19 virus infection.  We will put the patient on  Oxygen, IV steroids, inhalers.  Closely monitor oxygen levels.  Start remdesivir and Actemra.  Significantly high CRP.  Follow inflammatory markers.   COVID-19 Labs  Recent Labs    03/17/20 1545  DDIMER 3.45*  FERRITIN  1,911*  LDH 229*  CRP 22.7*    No results found for: SARSCOV2NAA   History of coronary artery disease/CABG no acute issues at this time.  Monitor.  Patient is on aspirin, Lipitor, metoprolol, Plavix, nitroglycerin and ranolazine at home. Will continue that while in the hospital.  Essential hypertension Continue amlodipine, metoprolol.  Blood pressure seems to be okay at this time.  Close  monitor.  DVT Prophylaxis: Lovenox. sibq  Consultant: None  Code Status: Full code  Microbiology blood cultures have been sent from the ED  Antibiotics:remdesivir  Family Communication:  Patients' condition and plan of care including tests being ordered have been discussed with the patient who indicate understanding and agree with the plan.  Severity of Illness: The appropriate patient status for this patient is INPATIENT. Inpatient status is judged to be reasonable and necessary in order to provide the required intensity of service to ensure the patient's safety. The patient's presenting symptoms, physical exam findings, and initial radiographic and laboratory data in the context of their chronic comorbidities is felt to place them at high risk for further clinical deterioration. Furthermore, it is not anticipated that the patient will be medically stable for discharge from the hospital within 2 midnights of admission.  I certify that at the point of admission it is my clinical judgment that the patient will require inpatient hospital care spanning beyond 2 midnights from the point of admission due to high intensity of service, high risk for further deterioration and high frequency of surveillance required.   Signed, Joycelyn Das, MD [facility] 03/17/2020

## 2020-03-17 NOTE — ED Provider Notes (Signed)
[facility] EMERGENCY DEPARTMENT Provider Note   CSN: [id number] Arrival date & time: 03/17/20  1528     History Chief Complaint  Patient presents with  . Shortness of Breath    Michael Spears is a 64 y.o. male with PMHx HTN, HLD, CAD s/p CABG, Fibromyalgia who presents to the ED via EMS with complaint of worsening shortness of breath x 1 week. Per EMS pt began having URI like symptoms approximately 2 weeks ago however did not get tested for COVID until 08/13; positive at that time. He states that since then he has been declining and having dyspnea on exertion. On EMS arrival pt's O2 sats in the 70s; placed on 4L with slow increase to 94%. Pt is not typically on home O2. BP mildly hypotensive in the 90s, given 500 CC fluid bolus. Pt denies any chest pain; EKG with EMS does show RBBB. He does have significantl cardiac history however with 4 vessel CABG in 2013. Pt has been having fevers up until yesterday, temp in the ED today 97.9.    The history is provided by the patient, medical records and the EMS personnel.       Past Medical History:  Diagnosis Date  . Chest pain    Imdur added during admxn 7/13=> intol 2/2 HA (stopped): Norvasc added 8/13;  Ranexa 500 bid added 8/13 (QTc stable) => CP much improved  . Chronic neck pain   . Coronary artery disease    s/p 4V CABG 12/10/11 (LIMA to LAD, sequential SVG to OM1 & OM2, SVG to PDA);  NSTEMI 01/13/12:  LHC 01/14/12: pLAD 40%, mLAD occluded, Dx 40%, pCFX 90%, OM1 small and occluded at the ostium, OM2 small with 99% stenosis, proximal and mid RCA 50%, mRCA occluded, S-OM1 and OM2 occluded, S-PDA okay, LIMA-LAD okay, EF 60%.  PCI: Promus DES to the pCFX    . Cough   . Fibromyalgia   . Headache(784.0)   . History of tobacco abuse    0.5 packs/day for 25 years, quit 2005  . HLD (hyperlipidemia)   . Hypertension   . Pericardial effusion    echo 12/29/11 small to moderate pericardial effusion primarily posteriorly and  anterolaterally; echo 01/14/12: Mild LVH, EF 65%, moderate pericardial effusion circumferential to the heart with no evidence of Tamponade-similar to 12/29/11 ; trivial effusion by echo 7/13  . Pneumonia    HX OF pna    Patient Active Problem List   Diagnosis Date Noted  . Pneumonia due to COVID-19 virus 03/17/2020  . CAD S/P CFX DES June 2013 03/28/2015  . Essential hypertension 03/28/2015  . Cough 02/21/2012  . NSTEMI (non-ST elevated myocardial infarction) (HCC) 01/15/2012  . Pericardial effusion   . Dyspnea 12/29/2011  . S/P CABG x 04 Dec 2011 12/09/2011  . Hyperlipidemia 12/07/2011  . Unstable angina (HCC) 12/06/2011  . PYELONEPHRITIS, ACUTE 01/29/2008  . ACUTE PROSTATITIS 01/29/2008  . NECK PAIN, CHRONIC 08/11/2007  . FIBROMYALGIA, SEVERE 04/26/2007  . HEADACHE 04/12/2007    Past Surgical History:  Procedure Laterality Date  . APPENDECTOMY    . CORONARY ARTERY BYPASS GRAFT  12/10/2011   Procedure: CORONARY ARTERY BYPASS GRAFTING (CABG);  Surgeon: Delight Ovens, MD;  Location: [facility] OR;  Service: Open Heart Surgery;  Laterality: N/A;  Times four  using endoscopically harvested right greater saphenous and left internal mammary artery.  [name] LEFT HEART CATHETERIZATION WITH CORONARY ANGIOGRAM N/A 12/07/2011   Procedure: LEFT HEART CATHETERIZATION WITH CORONARY ANGIOGRAM;  Surgeon:  [name], MD;  Location: [facility] CATH LAB;  Service: Cardiovascular;  Laterality: N/A;  . LEFT HEART CATHETERIZATION WITH CORONARY ANGIOGRAM N/A 01/14/2012   Procedure: LEFT HEART CATHETERIZATION WITH CORONARY ANGIOGRAM;  Surgeon: [name], MD;  Location: [facility] CATH LAB;  Service: Cardiovascular;  Laterality: N/A;  . MOUTH SURGERY    . NASAL SINUS SURGERY    . PERCUTANEOUS CORONARY STENT INTERVENTION (PCI-S)  01/14/2012   Procedure: PERCUTANEOUS CORONARY STENT INTERVENTION (PCI-S);  Surgeon: [name], MD;  Location: [facility] CATH LAB;  Service: Cardiovascular;;       Family  History  Problem Relation Age of Onset  . Hypertension Mother   . Hypertension Father   . Crohn's disease Father     Social History   Tobacco Use  . Smoking status: Former Smoker    Packs/day: 0.50    Years: 25.00    Pack years: 12.50    Types: Cigarettes    Quit date: 12/07/2003    Years since quitting: 16.2  . Smokeless tobacco: Never Used  Substance Use Topics  . Alcohol use: No  . Drug use: No    Home Medications Prior to Admission medications   Medication Sig Start Date End Date Taking? Authorizing Provider  amLODipine (NORVASC) 5 MG tablet TAKE 1 TABLET BY MOUTH EVERY DAY Patient taking differently: Take 5 mg by mouth daily.  03/21/19  Yes [name], MD  aspirin 81 MG EC tablet Take 1 tablet (81 mg total) by mouth daily. 01/15/12  Yes Hope, Jessica A, PA-C  atorvastatin (LIPITOR) 80 MG tablet TAKE 1 TABLET BY MOUTH EVERY DAY Patient taking differently: Take 80 mg by mouth daily.  02/25/20  Yes [name], MD  clopidogrel (PLAVIX) 75 MG tablet Take 1 tablet (75 mg total) by mouth daily. 05/11/19  Yes [name], MD  metoprolol tartrate (LOPRESSOR) 25 MG tablet TAKE 1 TABLET (25 MG TOTAL) BY MOUTH 2 (TWO) TIMES DAILY. 3 Patient taking differently: Take 25 mg by mouth 2 (two) times daily.  03/21/19  Yes [name], MD  ranolazine (RANEXA) 500 MG 12 hr tablet Take 1 tablet (500 mg total) by mouth 2 (two) times daily. 05/23/19  Yes [name], MD  nitroGLYCERIN (NITROSTAT) 0.4 MG SL tablet Place 1 tablet (0.4 mg total) under the tongue every 5 (five) minutes as needed for chest pain (up to 3 doses). 03/20/18 11/25/19  [name], MD    Allergies    Patient has no known allergies.  Review of Systems   Review of Systems  Constitutional: Positive for chills, fatigue and fever.  Respiratory: Positive for cough and shortness of breath.   Cardiovascular: Negative for chest pain.  Gastrointestinal: Positive  for diarrhea. Negative for abdominal pain, nausea and vomiting.  All other systems reviewed and are negative.   Physical Exam Updated Vital Signs BP 116/67   Pulse 82   Temp 97.9 F (36.6 C) (Oral)   Resp 20   Ht 5\' 10"  (1.778 m)   Wt 88.5 kg   SpO2 (!) 86%   BMI 27.98 kg/m   Physical Exam Vitals and nursing note reviewed.  Constitutional:      Appearance: He is ill-appearing. He is not diaphoretic.  HENT:     Head: Normocephalic and atraumatic.  Eyes:     Conjunctiva/sclera: Conjunctivae normal.  Cardiovascular:     Rate and Rhythm: Normal rate and regular rhythm.  Pulmonary:     Effort: Tachypnea  present.     Breath sounds: Normal breath sounds. No decreased breath sounds, wheezing, rhonchi or rales.     Comments: Able to speak in short sentences between coughing spells. Satting 86% on RA after being taken off of EMS's O2 (found to be in the 70's with EMS). Satting 90% on 4L.  Abdominal:     Palpations: Abdomen is soft.     Tenderness: There is no abdominal tenderness. There is no guarding or rebound.  Musculoskeletal:     Cervical back: Neck supple.  Skin:    General: Skin is warm and dry.  Neurological:     Mental Status: He is alert.     ED Results / Procedures / Treatments   Labs (all labs ordered are listed, but only abnormal results are displayed) Labs Reviewed  CBC WITH DIFFERENTIAL/PLATELET - Abnormal; Notable for the following components:      Result Value   WBC 15.0 (*)    Hemoglobin 12.7 (*)    HCT 37.6 (*)    Platelets 140 (*)    Neutro Abs 13.9 (*)    Lymphs Abs 0.5 (*)    Abs Immature Granulocytes 0.14 (*)    All other components within normal limits  COMPREHENSIVE METABOLIC PANEL - Abnormal; Notable for the following components:   Glucose, Bld 102 (*)    BUN 34 (*)    Creatinine, Ser 1.27 (*)    Calcium 8.8 (*)    Total Protein 5.6 (*)    Albumin 2.5 (*)    AST 51 (*)    GFR calc non Af Amer 59 (*)    All other components within  normal limits  D-DIMER, QUANTITATIVE (NOT AT [facility]) - Abnormal; Notable for the following components:   D-Dimer, Quant 3.45 (*)    All other components within normal limits  LACTATE DEHYDROGENASE - Abnormal; Notable for the following components:   LDH 229 (*)    All other components within normal limits  FERRITIN - Abnormal; Notable for the following components:   Ferritin 1,911 (*)    All other components within normal limits  TRIGLYCERIDES - Abnormal; Notable for the following components:   Triglycerides 202 (*)    All other components within normal limits  FIBRINOGEN - Abnormal; Notable for the following components:   Fibrinogen >800 (*)    All other components within normal limits  C-REACTIVE PROTEIN - Abnormal; Notable for the following components:   CRP 22.7 (*)    All other components within normal limits  TROPONIN I (HIGH SENSITIVITY) - Abnormal; Notable for the following components:   Troponin I (High Sensitivity) 18 (*)    All other components within normal limits  SARS CORONAVIRUS 2 BY RT PCR (HOSPITAL ORDER, PERFORMED IN [facility])  CULTURE, BLOOD (ROUTINE X 2)  CULTURE, BLOOD (ROUTINE X 2)  LACTIC ACID, PLASMA  PROCALCITONIN  HIV ANTIBODY (ROUTINE TESTING W REFLEX)  CBC WITH DIFFERENTIAL/PLATELET  COMPREHENSIVE METABOLIC PANEL  C-REACTIVE PROTEIN  D-DIMER, QUANTITATIVE (NOT AT [facility])  FERRITIN  MAGNESIUM  TROPONIN I (HIGH SENSITIVITY)    EKG EKG Interpretation  Date/Time:  Monday March 17 2020 15:42:44 EDT Ventricular Rate:  80 PR Interval:    QRS Duration: 135 QT Interval:  414 QTC Calculation: 478 R Axis:   26 Text Interpretation: Sinus rhythm Right bundle branch block Since last tracing Right bundle branch block NOW PRESENT Confirmed by Susy Frizzle [phone]) on 03/17/2020 4:00:51 PM   Radiology DG Chest [facility] 1 [address]  Result Date: 03/17/2020 CLINICAL DATA:  64 year old male with shortness of breath. Positive COVID-19. EXAM: PORTABLE  CHEST 1 VIEW COMPARISON:  Chest radiograph dated 02/21/2012. FINDINGS: Bilateral mid to lower lung field streaky densities most consistent with multifocal pneumonia and in keeping with provided history of COVID-19. Clinical correlation and follow-up recommended. No pleural effusion pneumothorax. The cardiac silhouette is within limits. Median sternotomy wires and CABG vascular clips. No acute osseous pathology. IMPRESSION: Multifocal pneumonia. Clinical correlation and follow-up recommended. Electronically Signed   By: Elgie Collard M.D.   On: 03/17/2020 16:03    Procedures Procedures (including critical care time)  Medications Ordered in ED Medications  aspirin EC tablet 81 mg (81 mg Oral Given 03/17/20 1847)  amLODipine (NORVASC) tablet 5 mg (5 mg Oral Given 03/17/20 1847)  atorvastatin (LIPITOR) tablet 80 mg (80 mg Oral Given 03/17/20 1847)  metoprolol tartrate (LOPRESSOR) tablet 25 mg (has no administration in time range)  nitroGLYCERIN (NITROSTAT) SL tablet 0.4 mg (has no administration in time range)  ranolazine (RANEXA) 12 hr tablet 500 mg (has no administration in time range)  clopidogrel (PLAVIX) tablet 75 mg (75 mg Oral Given 03/17/20 1847)  sodium chloride flush (NS) 0.9 % injection 3 mL (has no administration in time range)  sodium chloride flush (NS) 0.9 % injection 3 mL (has no administration in time range)  0.9 %  sodium chloride infusion (has no administration in time range)  albuterol (VENTOLIN HFA) 108 (90 Base) MCG/ACT inhaler 2 puff (2 puffs Inhalation Given 03/17/20 1934)  methylPREDNISolone sodium succinate (SOLU-MEDROL) 125 mg/2 mL injection 44.375 mg (44.375 mg Intravenous Given 03/17/20 1933)  guaiFENesin-dextromethorphan (ROBITUSSIN DM) 100-10 MG/5ML syrup 10 mL (10 mLs Oral Given 03/17/20 1949)  ascorbic acid (VITAMIN C) tablet 500 mg (500 mg Oral Given 03/17/20 1847)  zinc sulfate capsule 220 mg (220 mg Oral Given 03/17/20 1847)  acetaminophen (TYLENOL) tablet 650 mg (has  no administration in time range)  ondansetron (ZOFRAN) tablet 4 mg (has no administration in time range)    Or  ondansetron (ZOFRAN) injection 4 mg (has no administration in time range)  remdesivir 200 mg in sodium chloride 0.9% 250 mL IVPB (200 mg Intravenous New Bag/Given 03/17/20 1938)    Followed by  remdesivir 100 mg in sodium chloride 0.9 % 100 mL IVPB (has no administration in time range)  baricitinib (OLUMIANT) tablet 4 mg (has no administration in time range)    ED Course  I have reviewed the triage vital signs and the nursing notes.  Pertinent labs & imaging results that were available during my care of the patient were reviewed by me and considered in my medical decision making (see chart for details).  Clinical Course as of Mar 17 1954  Mon Mar 17, 2020  1550 SpO2(!): 86 % [MV]    Clinical Course User Index [MV] Tanda Rockers, [place]   MDM Rules/Calculators/A&P                          64 year old male who presents to the ED today with complaint of worsening shortness of breath, has been having URI-like symptoms for the past 2 weeks however only tested positive for COVID-19 on 08/13.  Found to be hypoxic in the 70s on room air with EMS and placed on 4 L.  Arrival to the ED patient is afebrile and nontachycardic.  He was taken off of EMSs oxygen and an O2 sat was collected for [place] at 86%.  Quickly  placed back on 4 L.  We will plan for admission at this time.  Will obtain chest x-ray and preadmission Covid labs.  Patient does have a significant cardiac history however he denies any complaint of chest pain currently.  Will add on a troponin at this time.   No covid test within our system and pt unable to provide info as [facility]. Will swab at this time.   CXR with bilateral pneumonia CBC with leukocytosis 15.1 however strong suspicion for COVID; will hold off on abx at this time CMP with creatinine 1.27 which appears around baseline COVID  labs elevated including CRP, ferritin, LDH, d dimer, fibrinogen   Have discussed case with [facility] Dr. Tyson BabinskiPokhrel who will evaluate for admission. COVID Test still has not returned; will await bed placement until it returns to know where exactly pt needs to go. Have updated pt on plan.   This note was prepared using Dragon voice recognition software and may include unintentional dictation errors due to the inherent limitations of voice recognition software.  Final Clinical Impression(s) / ED Diagnoses Final diagnoses:  COVID-19  Hypoxia  Pneumonia due to COVID-19 virus    Rx / DC Orders ED Discharge Orders    None       Tanda RockersVenter, Hollyn Stucky, PA-C 03/17/20 1955    Pollyann SavoySheldon, Charles B, MD 03/17/20 2228

## 2020-03-17 NOTE — ED Notes (Signed)
Michael Spears - father- [phone] -home                                          [phone]

## 2020-03-17 NOTE — ED Triage Notes (Signed)
Pt to ED via EMS from home, c/o [facility] , worse with exertion. On EMS arrival pt o2 sats 70s, placed on 4L- 94%- Not a home o2 user.  positive COVID  8/13, #18 LFA- bolus given by EMS. Last VS 114/72, RR 16, No pain.

## 2020-03-18 DIAGNOSIS — J069 Acute upper respiratory infection, unspecified: Secondary | ICD-10-CM

## 2020-03-18 LAB — CBC WITH DIFFERENTIAL/PLATELET
Abs Immature Granulocytes: 0 10*3/uL (ref 0.00–0.07)
Basophils Absolute: 0 10*3/uL (ref 0.0–0.1)
Basophils Relative: 0 %
Eosinophils Absolute: 0 10*3/uL (ref 0.0–0.5)
Eosinophils Relative: 0 %
HCT: 40.2 % (ref 39.0–52.0)
Hemoglobin: 13.6 g/dL (ref 13.0–17.0)
Lymphocytes Relative: 4 %
Lymphs Abs: 0.3 10*3/uL — ABNORMAL LOW (ref 0.7–4.0)
MCH: 29.9 pg (ref 26.0–34.0)
MCHC: 33.8 g/dL (ref 30.0–36.0)
MCV: 88.4 fL (ref 80.0–100.0)
Monocytes Absolute: 0.1 10*3/uL (ref 0.1–1.0)
Monocytes Relative: 1 %
Neutro Abs: 7.1 10*3/uL (ref 1.7–7.7)
Neutrophils Relative %: 95 %
Platelets: 177 10*3/uL (ref 150–400)
RBC: 4.55 MIL/uL (ref 4.22–5.81)
RDW: 12.8 % (ref 11.5–15.5)
WBC: 7.5 10*3/uL (ref 4.0–10.5)
nRBC: 0 % (ref 0.0–0.2)
nRBC: 0 /100 WBC

## 2020-03-18 LAB — COMPREHENSIVE METABOLIC PANEL
ALT: 59 U/L — ABNORMAL HIGH (ref 0–44)
AST: 69 U/L — ABNORMAL HIGH (ref 15–41)
Albumin: 2.4 g/dL — ABNORMAL LOW (ref 3.5–5.0)
Alkaline Phosphatase: 63 U/L (ref 38–126)
Anion gap: 13 (ref 5–15)
BUN: 26 mg/dL — ABNORMAL HIGH (ref 8–23)
CO2: 22 mmol/L (ref 22–32)
Calcium: 8.7 mg/dL — ABNORMAL LOW (ref 8.9–10.3)
Chloride: 100 mmol/L (ref 98–111)
Creatinine, Ser: 1.05 mg/dL (ref 0.61–1.24)
GFR calc Af Amer: >60 mL/min (ref >60)
GFR calc non Af Amer: >60 mL/min (ref >60)
Glucose, Bld: 160 mg/dL — ABNORMAL HIGH (ref 70–99)
Potassium: 3.8 mmol/L (ref 3.5–5.1)
Sodium: 135 mmol/L (ref 135–145)
Total Bilirubin: 1 mg/dL (ref 0.3–1.2)
Total Protein: 5.7 g/dL — ABNORMAL LOW (ref 6.5–8.1)

## 2020-03-18 LAB — D-DIMER, QUANTITATIVE: D-Dimer, Quant: 4.34 ug/mL-FEU — ABNORMAL HIGH (ref 0.00–0.50)

## 2020-03-18 LAB — HIV ANTIBODY (ROUTINE TESTING W REFLEX): HIV Screen 4th Generation wRfx: NONREACTIVE

## 2020-03-18 LAB — MAGNESIUM: Magnesium: 2.3 mg/dL (ref 1.7–2.4)

## 2020-03-18 LAB — FERRITIN: Ferritin: 2710 ng/mL — ABNORMAL HIGH (ref 24–336)

## 2020-03-18 LAB — C-REACTIVE PROTEIN: CRP: 21.8 mg/dL — ABNORMAL HIGH (ref <1.0)

## 2020-03-18 MED ORDER — METHYLPREDNISOLONE SODIUM SUCC 125 MG IJ SOLR
60.0000 mg | Freq: Three times a day (TID) | INTRAMUSCULAR | Status: DC
  Administered 2020-03-18 – 2020-03-20 (×6): 60 mg via INTRAVENOUS
  Filled 2020-03-18 (×6): qty 2

## 2020-03-18 MED ORDER — ENOXAPARIN SODIUM 60 MG/0.6ML ~~LOC~~ SOLN
45.0000 mg | Freq: Two times a day (BID) | SUBCUTANEOUS | Status: DC
  Administered 2020-03-18 – 2020-03-23 (×10): 45 mg via SUBCUTANEOUS
  Filled 2020-03-18: qty 0.45
  Filled 2020-03-18 (×9): qty 0.6

## 2020-03-18 MED ORDER — SODIUM CHLORIDE 0.9 % IV SOLN
1.0000 g | INTRAVENOUS | Status: AC
  Administered 2020-03-18 – 2020-03-20 (×3): 1 g via INTRAVENOUS
  Filled 2020-03-18 (×3): qty 10

## 2020-03-18 MED ORDER — PANTOPRAZOLE SODIUM 40 MG PO TBEC
40.0000 mg | DELAYED_RELEASE_TABLET | Freq: Every day | ORAL | Status: DC
  Administered 2020-03-18 – 2020-03-25 (×8): 40 mg via ORAL
  Filled 2020-03-18 (×8): qty 1

## 2020-03-18 MED ORDER — SODIUM CHLORIDE 0.9 % IV SOLN
500.0000 mg | INTRAVENOUS | Status: AC
  Administered 2020-03-18 – 2020-03-20 (×3): 500 mg via INTRAVENOUS
  Filled 2020-03-18 (×3): qty 500

## 2020-03-18 MED ORDER — ENOXAPARIN SODIUM 40 MG/0.4ML ~~LOC~~ SOLN
40.0000 mg | Freq: Two times a day (BID) | SUBCUTANEOUS | Status: DC
  Administered 2020-03-18: 40 mg via SUBCUTANEOUS
  Filled 2020-03-18: qty 0.4

## 2020-03-18 MED ORDER — SODIUM CHLORIDE 0.9 % IV SOLN: INTRAVENOUS | Status: AC

## 2020-03-18 MED ORDER — ENSURE ENLIVE PO LIQD
237.0000 mL | Freq: Two times a day (BID) | ORAL | Status: DC
  Administered 2020-03-19 – 2020-03-22 (×8): 237 mL via ORAL
  Filled 2020-03-18: qty 237

## 2020-03-18 MED ORDER — LOPERAMIDE HCL 2 MG PO CAPS
2.0000 mg | ORAL_CAPSULE | ORAL | Status: DC | PRN
  Administered 2020-03-19 – 2020-03-22 (×3): 2 mg via ORAL
  Filled 2020-03-18 (×3): qty 1

## 2020-03-18 NOTE — ED Notes (Signed)
Pt did have additional episode of diarrhea in [facility], pt was able to clean himself up, BSC emptied (urine and loose stool)

## 2020-03-18 NOTE — Progress Notes (Addendum)
PROGRESS NOTE                                                                                                                                                                                                             Patient Demographics:    Michael Spears, is a 64 y.o. male, DOB - 08/17/1955, ONG:[id number]  Admit date - 03/17/2020   Admitting Physician Starleen Arms, MD  Outpatient Primary MD for the patient is [name], MD  LOS - 1   Chief Complaint  Patient presents with  . Shortness of Breath       Brief Narrative     HPI: Michael Spears is a 64 y.o. male with past medical history of hypertension, hyperlipidemia, history of tobacco abuse, hyper myalgia, history of coronary artery disease status post CABG, history of pericardial effusion presented to hospital with complaints of shortness of breath for 1 week.  Patient did have some flulike symptoms 2 weeks back and had a Covid test done on 8/13 in [place] [facility] clinic which was positive and was called to come to the ER on 03/16/2020.[name]  Patient has been having increasing shortness of breath and dyspnea since the last 2 to 3 days.  EMS was called in due to dyspnea and was noted to be hypoxic in the 70s and was put on 4 L of nasal cannula with increasing his saturation to 94%.  He was also noted to be mildly hypotensive and received 500 mL of fluid bolus.  Patient denied any sputum production or chest pain.  History of subjective fever at home as well.  Patient also complained of diarrhea 2-3 episodes of watery diarrhea.  He also has fatigue tiredness.  ED Course: In the ED, patient was noted to be hypoxic and was on supplemental oxygen.  EKG showed right bundle branch block.  Chest x-ray showed evidence of multifocal pneumonia.  Ferritin was elevated at 1911, CRP elevated at 22.  Lactate was 1.7 procalcitonin 0.5.  Triglyceride was elevated at 202.  Creatinine was elevated at 1.2.  LDH elevated at  229.  WBC is mildly elevated at 15.0.  D-dimer elevated at 3.4.  Fibrinogen elevated .[name]  Patient was then considered for admission to hospital for acute hypoxic respiratory failure secondary to Covid pneumonia.  Patient reported that he was unvaccinated.  Subjective:    Michael Spears today reports dyspnea, cough, generalized weakness, diarrhea, poor appetite.    Assessment  & Plan :    Principal Problem:   Pneumonia due to COVID-19 virus Active Problems:   Hyperlipidemia   S/P CABG x 04 Dec 2011   Dyspnea   Essential hypertension   Acute respiratory disease due to COVID-19 virus  Acute hypoxic respiratory failure secondary to multifocal pneumonia secondary to Covid 19 virus infection.  -Patient is unvaccinated. -He presents  with severe COVID-19 pneumonia, with significant oxygen requirement, he is currently on 15 L high flow and 15 L NRB. -Discussed with patient, to use incentive spirometry, flutter valve, to prone and to get out of bed to chair. -Continue to trend inflammatory markers closely, CRP significantly elevated 22.7, D-dimers as well as trending up, please see discussion below. -Continue with IV steroids, I have increased his dose to 60 mg every 8 hours. -Continue with baricitnub -Continue with remdesivir -Procalcitonin is 0.56, continue with IV Rocephin and azithromycin.. -Patient extremely frail, with poor appetite and diarrhea, will start on IV fluids  Elevated D-dimers - related to Covid, will get  venous Dopplers, continue to trend, will keep on lovenox 0.5 mg/kg every 12 hours for DVT prophylaxis.  COVID-19 Labs  Recent Labs    03/17/20 1545 03/18/20 0408  DDIMER 3.45* 4.34*  FERRITIN 1,911* 2,710*  LDH 229*  --   CRP 22.7* 21.8*    Lab Results  Component Value Date   SARSCOV2NAA POSITIVE (A) 03/17/2020   History of coronary artery disease/CABG : - no acute issues at this time.  Monitor.  Patient is on aspirin, Lipitor, metoprolol, Plavix, nitroglycerin  and ranolazine at home. Will continue that while in the hospital.  Essential hypertension - Continue amlodipine, metoprolol.  Blood pressure seems to be okay at this time.  Close monitor.  Code Status : Full  Family Communication  : D/W brother Loraine Leriche  Disposition Plan  :  Status is: Inpatient  Remains inpatient appropriate because:Hemodynamically unstable and IV treatments appropriate due to intensity of illness or inability to take PO   Dispo: The patient is from: Home              Anticipated d/c is to: Home              Anticipated d/c date is: > 3 days              Patient currently is not medically stable to d/c.      Consults  :  None  Procedures  : None  DVT Prophylaxis  :  [place] lovenox  Lab Results  Component Value Date   PLT 177 03/18/2020    Antibiotics  :    Anti-infectives (From admission, onward)   Start     Dose/Rate Route Frequency Ordered Stop   03/18/20 1000  remdesivir 100 mg in sodium chloride 0.9 % 100 mL IVPB  Status:  Discontinued       "Followed by" Linked Group Details   100 mg 200 mL/hr over 30 Minutes Intravenous Daily 03/17/20 1833 03/17/20 1835   03/18/20 1000  remdesivir 100 mg in sodium chloride 0.9 % 100 mL IVPB     Discontinue    "Followed by" Linked Group Details   100 mg 200 mL/hr over 30 Minutes Intravenous Every 24 hours 03/17/20 1826 03/22/20 0959   03/17/20 1930  remdesivir 200 mg in sodium chloride 0.9% 250 mL IVPB       "  Followed by" Linked Group Details   200 mg 580 mL/hr over 30 Minutes Intravenous Once 03/17/20 1826 03/17/20 2044   03/17/20 1833  remdesivir 200 mg in sodium chloride 0.9% 250 mL IVPB  Status:  Discontinued       "Followed by" Linked Group Details   200 mg 580 mL/hr over 30 Minutes Intravenous Once 03/17/20 1833 03/17/20 1835        Objective:   Vitals:   03/18/20 1045 03/18/20 1101 03/18/20 1300 03/18/20 1322  BP: 131/69 138/80 136/73   Pulse: 83  81 84  Resp:  17 (!) 34 18  Temp:    98 F (36.7  C)  TempSrc:    Oral  SpO2: 92% 100% 97% 94%  Weight:      Height:        Wt Readings from Last 3 Encounters:  03/17/20 88.5 kg  10/10/19 90 kg  03/28/19 87.5 kg     Intake/Output Summary (Last 24 hours) at 03/18/2020 1411 Last data filed at 03/18/2020 1318 Gross per 24 hour  Intake 100 ml  Output --  Net 100 ml     Physical Exam  Awake Alert, Oriented X 3, extremely frail, no new F.N deficits, Normal affect Symmetrical Chest wall movement, patient is mildly tachypneic, no wheezing RRR,No Gallops,Rubs or new Murmurs, No Parasternal Heave +ve B.Sounds, Abd Soft, No tenderness,  No rebound - guarding or rigidity. No Cyanosis, Clubbing or edema, No new Rash or bruise      Data Review:    CBC Recent Labs  Lab 03/17/20 1545 03/18/20 0408  WBC 15.0* 7.5  HGB 12.7* 13.6  HCT 37.6* 40.2  PLT 140* 177  MCV 87.6 88.4  MCH 29.6 29.9  MCHC 33.8 33.8  RDW 12.7 12.8  LYMPHSABS 0.5* 0.3*  MONOABS 0.5 0.1  EOSABS 0.0 0.0  BASOSABS 0.0 0.0    Chemistries  Recent Labs  Lab 03/17/20 1545 03/18/20 0408  NA 136 135  K 3.5 3.8  CL 103 100  CO2 23 22  GLUCOSE 102* 160*  BUN 34* 26*  CREATININE 1.27* 1.05  CALCIUM 8.8* 8.7*  MG  --  2.3  AST 51* 69*  ALT 41 59*  ALKPHOS 58 63  BILITOT 1.1 1.0   ------------------------------------------------------------------------------------------------------------------ Recent Labs    03/17/20 1545  TRIG 202*    Lab Results  Component Value Date   HGBA1C 5.9 (H) 12/06/2011   ------------------------------------------------------------------------------------------------------------------ No results for input(s): TSH, T4TOTAL, T3FREE, THYROIDAB in the last 72 hours.  Invalid input(s): FREET3 ------------------------------------------------------------------------------------------------------------------ Recent Labs    03/17/20 1545 03/18/20 0408  FERRITIN 1,911* 2,710*    Coagulation profile No results for  input(s): INR, PROTIME in the last 168 hours.  Recent Labs    03/17/20 1545 03/18/20 0408  DDIMER 3.45* 4.34*    Cardiac Enzymes No results for input(s): CKMB, TROPONINI, MYOGLOBIN in the last 168 hours.  Invalid input(s): CK ------------------------------------------------------------------------------------------------------------------ No results found for: BNP  Inpatient Medications  Scheduled Meds: . albuterol  2 puff Inhalation Q6H  . amLODipine  5 mg Oral Daily  . vitamin C  500 mg Oral Daily  . aspirin EC  81 mg Oral Daily  . atorvastatin  80 mg Oral Daily  . baricitinib  4 mg Oral Daily  . clopidogrel  75 mg Oral Daily  . enoxaparin (LOVENOX) injection  40 mg Subcutaneous Q12H  . methylPREDNISolone (SOLU-MEDROL) injection  0.5 mg/kg Intravenous BID  . metoprolol tartrate  25 mg Oral BID  .  pantoprazole  40 mg Oral Daily  . ranolazine  500 mg Oral BID  . sodium chloride flush  3 mL Intravenous Q12H  . zinc sulfate  220 mg Oral Daily   Continuous Infusions: . sodium chloride    . remdesivir 100 mg in NS 100 mL Stopped (03/18/20 1318)   PRN Meds:.sodium chloride, acetaminophen, guaiFENesin-dextromethorphan, nitroGLYCERIN, ondansetron **OR** ondansetron (ZOFRAN) IV, sodium chloride flush  Micro Results Recent Results (from the past 240 hour(s))  Blood Culture (routine x 2)     Status: None (Preliminary result)   Collection Time: 03/17/20  4:20 PM   Specimen: BLOOD RIGHT ARM  Result Value Ref Range Status   Specimen Description BLOOD RIGHT ARM  Final   Special Requests   Final    BOTTLES DRAWN AEROBIC AND ANAEROBIC Blood Culture adequate volume   Culture   Final    NO GROWTH < 12 HOURS Performed at [facility], [address] [place], [place] [address]    Report Status PENDING  Incomplete  Blood Culture (routine x 2)     Status: None (Preliminary result)   Collection Time: 03/17/20  4:50 PM   Specimen: BLOOD RIGHT HAND  Result Value Ref Range  Status   Specimen Description BLOOD RIGHT HAND  Final   Special Requests   Final    BOTTLES DRAWN AEROBIC AND ANAEROBIC Blood Culture adequate volume   Culture   Final    NO GROWTH < 12 HOURS Performed at [facility], [address] [place], [place] [address]    Report Status PENDING  Incomplete  SARS Coronavirus 2 by RT PCR (hospital order, performed in [facility] lab) Nasopharyngeal Nasopharyngeal Swab     Status: Abnormal   Collection Time: 03/17/20  5:20 PM   Specimen: Nasopharyngeal Swab  Result Value Ref Range Status   SARS Coronavirus 2 POSITIVE (A) NEGATIVE Final    Comment: RESULT CALLED TO, READ BACK BY AND VERIFIED WITH: J,SAWATZKI @2001  03/17/20 EB (NOTE) SARS-CoV-2 target nucleic acids are DETECTED  SARS-CoV-2 RNA is generally detectable in upper respiratory specimens  during the acute phase of infection.  Positive results are indicative  of the presence of the identified virus, but do not rule out bacterial infection or co-infection with other pathogens not detected by the test.  Clinical correlation with patient history and  other diagnostic information is necessary to determine patient infection status.  The expected result is negative.  Fact Sheet for Patients:   BoilerBrush.com.cyhttps://www.fda.gov/media/136312/download   Fact Sheet for Healthcare Providers:   https://pope.com/https://www.fda.gov/media/136313/download    This test is not yet approved or cleared by the [place] FDA and  has been authorized for detection and/or diagnosis of SARS-CoV-2 by FDA under an Emergency Use Authorization (EUA).  This EUA will remain in effect (meaning this test can  be used) for the duration of  the COVID-19 declaration under Section 564(b)(1) of the Act, 21 U.S.C. section 360-bbb-3(b)(1), unless the authorization is terminated or revoked sooner.  Performed at [facility], [address] [place], [place] [address]     Radiology Reports DG Chest AndersonPort 1 View  Result  Date: 03/17/2020 CLINICAL DATA:  64 year old male with shortness of breath. Positive COVID-19. EXAM: PORTABLE CHEST 1 VIEW COMPARISON:  Chest radiograph dated 02/21/2012. FINDINGS: Bilateral mid to lower lung field streaky densities most consistent with multifocal pneumonia and in keeping with provided history of COVID-19. Clinical correlation and follow-up recommended. No pleural effusion pneumothorax. The cardiac  silhouette is within limits. Median sternotomy wires and CABG vascular clips. No acute osseous pathology. IMPRESSION: Multifocal pneumonia. Clinical correlation and follow-up recommended. Electronically Signed   By: Elgie Collard M.D.   On: 03/17/2020 16:03      Huey Bienenstock M.D on 03/18/2020 at 2:11 PM    [facility] -  Office  [phone]

## 2020-03-18 NOTE — ED Notes (Signed)
Incentive spirometer and flutter valve given to patient with education. Verbalized understanding.  

## 2020-03-18 NOTE — ED Notes (Signed)
Pt reports increasing sob.  Pt is on 15L humidified O2 by [place].  Pt spo2 86%.  Helped pt reposition at this time and spo2 increased, pt continues to be tachypneic in high 20's but spo2 93-95%

## 2020-03-18 NOTE — ED Notes (Signed)
Notified pharmacy for missing med

## 2020-03-18 NOTE — ED Notes (Signed)
Applied [id number] in addition to 15L HFNC per admittimd MD (Dr. Randol Kern).  Pt is resting at this time, Spo2 100% on the 30L at this time

## 2020-03-18 NOTE — ED Notes (Signed)
Ordered breakfast 

## 2020-03-18 NOTE — ED Notes (Signed)
Spoke with respiratory regarding pt O2 status per transporting to floor, [place] RT made this RN aware to take pt up on portable tank via nonrebreather 15L and nc15L portable tank. RT to meet this RN on floor to administer humidified O2.

## 2020-03-18 NOTE — ED Notes (Signed)
Pt is on BSC.  Moved hospital bed into room for pt so he may be able to rest more comfortably.  Will give rest of medications as soon as pt is back on hospital bed.

## 2020-03-18 NOTE — ED Notes (Addendum)
Pt SPO2 88-89% on 5L O2 via [place], breathing doesn't appear labored at this time, but pt tachypenic, this RN placed pt on 15L nonrebreather SPO2 increased too 96%, pt NSR on cardiac monitor. Pt denies SOB at this time, remains tachypenic with no use of accessory muscles noted or nasal refractions.

## 2020-03-18 NOTE — ED Notes (Signed)
Dinner ordered 

## 2020-03-19 ENCOUNTER — Other Ambulatory Visit: Payer: Self-pay

## 2020-03-19 ENCOUNTER — Inpatient Hospital Stay (HOSPITAL_COMMUNITY): Payer: BC Managed Care – PPO

## 2020-03-19 DIAGNOSIS — R7989 Other specified abnormal findings of blood chemistry: Secondary | ICD-10-CM

## 2020-03-19 DIAGNOSIS — U071 COVID-19: Secondary | ICD-10-CM

## 2020-03-19 LAB — CBC WITH DIFFERENTIAL/PLATELET
Abs Immature Granulocytes: 0.07 10*3/uL (ref 0.00–0.07)
Basophils Absolute: 0 10*3/uL (ref 0.0–0.1)
Basophils Relative: 0 %
Eosinophils Absolute: 0 10*3/uL (ref 0.0–0.5)
Eosinophils Relative: 0 %
HCT: 36.8 % — ABNORMAL LOW (ref 39.0–52.0)
Hemoglobin: 12.5 g/dL — ABNORMAL LOW (ref 13.0–17.0)
Immature Granulocytes: 1 %
Lymphocytes Relative: 6 %
Lymphs Abs: 0.7 10*3/uL (ref 0.7–4.0)
MCH: 30 pg (ref 26.0–34.0)
MCHC: 34 g/dL (ref 30.0–36.0)
MCV: 88.2 fL (ref 80.0–100.0)
Monocytes Absolute: 0.5 10*3/uL (ref 0.1–1.0)
Monocytes Relative: 5 %
Neutro Abs: 9.6 10*3/uL — ABNORMAL HIGH (ref 1.7–7.7)
Neutrophils Relative %: 88 %
Platelets: 193 10*3/uL (ref 150–400)
RBC: 4.17 MIL/uL — ABNORMAL LOW (ref 4.22–5.81)
RDW: 13 % (ref 11.5–15.5)
WBC: 10.9 10*3/uL — ABNORMAL HIGH (ref 4.0–10.5)
nRBC: 0.3 % — ABNORMAL HIGH (ref 0.0–0.2)

## 2020-03-19 LAB — COMPREHENSIVE METABOLIC PANEL
ALT: 59 U/L — ABNORMAL HIGH (ref 0–44)
AST: 57 U/L — ABNORMAL HIGH (ref 15–41)
Albumin: 2.2 g/dL — ABNORMAL LOW (ref 3.5–5.0)
Alkaline Phosphatase: 56 U/L (ref 38–126)
Anion gap: 11 (ref 5–15)
BUN: 25 mg/dL — ABNORMAL HIGH (ref 8–23)
CO2: 22 mmol/L (ref 22–32)
Calcium: 8.5 mg/dL — ABNORMAL LOW (ref 8.9–10.3)
Chloride: 103 mmol/L (ref 98–111)
Creatinine, Ser: 0.93 mg/dL (ref 0.61–1.24)
GFR calc Af Amer: >60 mL/min (ref >60)
GFR calc non Af Amer: >60 mL/min (ref >60)
Glucose, Bld: 159 mg/dL — ABNORMAL HIGH (ref 70–99)
Potassium: 4.2 mmol/L (ref 3.5–5.1)
Sodium: 136 mmol/L (ref 135–145)
Total Bilirubin: 1 mg/dL (ref 0.3–1.2)
Total Protein: 5.1 g/dL — ABNORMAL LOW (ref 6.5–8.1)

## 2020-03-19 LAB — D-DIMER, QUANTITATIVE: D-Dimer, Quant: 3.84 ug/mL-FEU — ABNORMAL HIGH (ref 0.00–0.50)

## 2020-03-19 LAB — MAGNESIUM: Magnesium: 2.6 mg/dL — ABNORMAL HIGH (ref 1.7–2.4)

## 2020-03-19 LAB — PHOSPHORUS: Phosphorus: 2.6 mg/dL (ref 2.5–4.6)

## 2020-03-19 LAB — C-REACTIVE PROTEIN: CRP: 10.4 mg/dL — ABNORMAL HIGH (ref <1.0)

## 2020-03-19 LAB — FERRITIN: Ferritin: 2737 ng/mL — ABNORMAL HIGH (ref 24–336)

## 2020-03-19 NOTE — Progress Notes (Signed)
Venous duplex lower ext.  has been completed. Refer to [facility] under chart review to view preliminary results.   03/19/2020  2:23 PM Lakoda Raske, Gerarda Gunther

## 2020-03-19 NOTE — Progress Notes (Signed)
PROGRESS NOTE                                                                                                                                                                                                             Patient Demographics:    Michael Spears, is a 64 y.o. male, DOB - Aug 20, 1955, FAO:[id number]  Outpatient Primary MD for the patient is Deatra JamesSun, Vyvyan, MD   Admit date - 03/17/2020   LOS - 2  Chief Complaint  Patient presents with  . Shortness of Breath       Brief Narrative: Patient is a 64 y.o. male with PMHx of HTN, HLD, CAD s/p CABG, history of pericardial effusion-presented with 1 week shortness of breath-found to have acute hypoxic respiratory failure secondary to COVID-19 pneumonia.  COVID-19 vaccinated status: Unvaccinated  Significant Events: 8/13>> COVID-19 positive ([facility]) 8/16>> [facility] hypoxia from COVID-19 pneumonia  Significant studies: 8/16>>Chest x-ray: Multifocal pneumonia 8/18>> bilateral lower extremity Doppler: No DVT (prelim)  COVID-19 medications: Steroids: 8/16>> Remdesivir: 8/16>> Baricitinib: 8/16>>  Antibiotics: Rocephin: 8/17>> Zithromax: 8/17>>  Microbiology data: 8/16 >>blood culture: No growth  Procedures: None  Consults: None  DVT prophylaxis: Prophylactic Lovenox at twice daily dosing    Subjective:    Michael BankerGary Spears today remains essentially the same-still on high flow oxygen.  Appears comfortable at rest.   Assessment  & Plan :   Acute Hypoxic Resp Failure due to Covid 19 Viral pneumonia: Remains with severe hypoxemia but comfortable-no evidence of volume overload on exam-inflammatory markers slowly downtrending.  Plans are to continue steroids/remdesivir/baricitinib.  Continue to monitor closely-if he worsens significantly/develops respiratory distress-then will need transfer to the ICU.  Fever: afebrile O2 requirements:  SpO2: 90  % O2 Flow Rate (L/min): 30 L/min   COVID-19 Labs: Recent Labs    03/17/20 1545 03/18/20 0408 03/19/20 0327  DDIMER 3.45* 4.34* 3.84*  FERRITIN 1,911* 2,710* 2,737*  LDH 229*  --   --   CRP 22.7* 21.8* 10.4*    No results found for: BNP  Recent Labs  Lab 03/17/20 1545  PROCALCITON 0.56    Lab Results  Component Value Date   SARSCOV2NAA POSITIVE (A) 03/17/2020     Prone/Incentive Spirometry: encouraged patient to lie prone for 3-4 hours at a time for a total of 16 hours a day,  and to encourage incentive spirometry use 3-4/hour.  Elevated D-dimer: Hypoxia remains stable without any further worsening-lower extremity Doppler negative-on intermediate dosing of Lovenox.  Follow periodically.  If hypoxia worsens-we will plan CTA chest.  Transaminitis: Mild-downtrending-stable for continued follow-up.  Etiology likely secondary to COVID-19.  CAD-s/p CABG 2013: No anginal symptoms-remains on aspirin/Plavix/beta-blocker and statin.  HTN: BP stable-continue amlodipine and metoprolol.  ABG:    Component Value Date/Time   PHART 7.311 (L) 12/10/2011 1847   PCO2ART 43.0 12/10/2011 1847   PO2ART 83.0 12/10/2011 1847   HCO3 21.7 12/10/2011 1847   TCO2 27 12/29/2011 0112   ACIDBASEDEF 4.0 (H) 12/10/2011 1847   O2SAT 95.0 12/10/2011 1847    Vent Settings: N/A  Condition - Extremely Guarded  Family Communication  :  Sister in Geophysicist/field seismologist the phone  Code Status :  Full Code  Diet :  Diet Order            Diet Heart Room service appropriate? Yes; Fluid consistency: Thin  Diet effective now                  Disposition Plan  :   Status is: Inpatient  Remains inpatient appropriate because:Inpatient level of care appropriate due to severity of illness   Dispo: The patient is from: Home              Anticipated d/c is to: TBS              Anticipated d/c date is: > 3 days              Patient currently is not medically stable to d/c.   Barriers to discharge:  Hypoxia requiring O2 supplementation/complete 5 days of IV Remdesivir  Antimicorbials  :    Anti-infectives (From admission, onward)   Start     Dose/Rate Route Frequency Ordered Stop   03/18/20 1415  azithromycin (ZITHROMAX) 500 mg in sodium chloride 0.9 % 250 mL IVPB     Discontinue     500 mg 250 mL/hr over 60 Minutes Intravenous Every 24 hours 03/18/20 1414     03/18/20 1415  cefTRIAXone (ROCEPHIN) 1 g in sodium chloride 0.9 % 100 mL IVPB     Discontinue     1 g 200 mL/hr over 30 Minutes Intravenous Every 24 hours 03/18/20 1414     03/18/20 1000  remdesivir 100 mg in sodium chloride 0.9 % 100 mL IVPB  Status:  Discontinued       "Followed by" Linked Group Details   100 mg 200 mL/hr over 30 Minutes Intravenous Daily 03/17/20 1833 03/17/20 1835   03/18/20 1000  remdesivir 100 mg in sodium chloride 0.9 % 100 mL IVPB     Discontinue    "Followed by" Linked Group Details   100 mg 200 mL/hr over 30 Minutes Intravenous Every 24 hours 03/17/20 1826 03/22/20 0959   03/17/20 1930  remdesivir 200 mg in sodium chloride 0.9% 250 mL IVPB       "Followed by" Linked Group Details   200 mg 580 mL/hr over 30 Minutes Intravenous Once 03/17/20 1826 03/17/20 2044   03/17/20 1833  remdesivir 200 mg in sodium chloride 0.9% 250 mL IVPB  Status:  Discontinued       "Followed by" Linked Group Details   200 mg 580 mL/hr over 30 Minutes Intravenous Once 03/17/20 1833 03/17/20 1835      Inpatient Medications  Scheduled Meds: . albuterol  2 puff Inhalation Q6H  .  amLODipine  5 mg Oral Daily  . vitamin C  500 mg Oral Daily  . aspirin EC  81 mg Oral Daily  . atorvastatin  80 mg Oral Daily  . baricitinib  4 mg Oral Daily  . clopidogrel  75 mg Oral Daily  . enoxaparin (LOVENOX) injection  45 mg Subcutaneous Q12H  . feeding supplement (ENSURE ENLIVE)  237 mL Oral BID BM  . methylPREDNISolone (SOLU-MEDROL) injection  60 mg Intravenous Q8H  . metoprolol tartrate  25 mg Oral BID  . pantoprazole  40 mg  Oral Daily  . ranolazine  500 mg Oral BID  . sodium chloride flush  3 mL Intravenous Q12H  . zinc sulfate  220 mg Oral Daily   Continuous Infusions: . sodium chloride    . azithromycin 500 mg (03/19/20 1323)  . cefTRIAXone (ROCEPHIN)  IV Stopped (03/18/20 1700)  . remdesivir 100 mg in NS 100 mL 100 mg (03/19/20 0911)   PRN Meds:.sodium chloride, acetaminophen, guaiFENesin-dextromethorphan, loperamide, nitroGLYCERIN, ondansetron **OR** ondansetron (ZOFRAN) IV, sodium chloride flush   Time Spent in minutes  35   See all Orders from today for further details  Jeoffrey Massed M.D on 03/19/2020 at 3:09 PM  To page go to [email or web address] - use universal password  [facility] -  Office  [phone]    Objective:   Vitals:   03/19/20 0408 03/19/20 0736 03/19/20 0749 03/19/20 1416  BP:  (!) 103/56 (!) 103/59 (!) 104/57  Pulse: 74 77 76 79  Resp: 20 (!) 28 (!) 22 17  Temp:  97.9 F (36.6 C) 98.1 F (36.7 C) 97.9 F (36.6 C)  TempSrc:  Oral Oral Oral  SpO2: 91% 90% 94% 90%  Weight:      Height:        Wt Readings from Last 3 Encounters:  03/17/20 88.5 kg  10/10/19 90 kg  03/28/19 87.5 kg     Intake/Output Summary (Last 24 hours) at 03/19/2020 1509 Last data filed at 03/19/2020 0416 Gross per 24 hour  Intake 1286.25 ml  Output 450 ml  Net 836.25 ml     Physical Exam Gen Exam:Alert awake-not in any distress HEENT:atraumatic, normocephalic Chest: B/L clear to auscultation anteriorly CVS:S1S2 regular Abdomen:soft non tender, non distended Extremities:no edema Neurology: Non focal Skin: no rash   Data Review:    CBC Recent Labs  Lab 03/17/20 1545 03/18/20 0408 03/19/20 0327  WBC 15.0* 7.5 10.9*  HGB 12.7* 13.6 12.5*  HCT 37.6* 40.2 36.8*  PLT 140* 177 193  MCV 87.6 88.4 88.2  MCH 29.6 29.9 30.0  MCHC 33.8 33.8 34.0  RDW 12.7 12.8 13.0  LYMPHSABS 0.5* 0.3* 0.7  MONOABS 0.5 0.1 0.5  EOSABS 0.0 0.0 0.0  BASOSABS 0.0 0.0 0.0    Chemistries    Recent Labs  Lab 03/17/20 1545 03/18/20 0408 03/19/20 0327  NA 136 135 136  K 3.5 3.8 4.2  CL 103 100 103  CO2 23 22 22   GLUCOSE 102* 160* 159*  BUN 34* 26* 25*  CREATININE 1.27* 1.05 0.93  CALCIUM 8.8* 8.7* 8.5*  MG  --  2.3 2.6*  AST 51* 69* 57*  ALT 41 59* 59*  ALKPHOS 58 63 56  BILITOT 1.1 1.0 1.0   ------------------------------------------------------------------------------------------------------------------ Recent Labs    03/17/20 1545  TRIG 202*    Lab Results  Component Value Date   HGBA1C 5.9 (H) 12/06/2011   ------------------------------------------------------------------------------------------------------------------ No results for input(s): TSH, T4TOTAL, T3FREE, THYROIDAB in the last 72 hours.  Invalid input(s): FREET3 ------------------------------------------------------------------------------------------------------------------ Recent Labs    03/18/20 0408 03/19/20 0327  FERRITIN 2,710* 2,737*    Coagulation profile No results for input(s): INR, PROTIME in the last 168 hours.  Recent Labs    03/18/20 0408 03/19/20 0327  DDIMER 4.34* 3.84*    Cardiac Enzymes No results for input(s): CKMB, TROPONINI, MYOGLOBIN in the last 168 hours.  Invalid input(s): CK ------------------------------------------------------------------------------------------------------------------ No results found for: BNP  Micro Results Recent Results (from the past 240 hour(s))  Blood Culture (routine x 2)     Status: None (Preliminary result)   Collection Time: 03/17/20  4:20 PM   Specimen: BLOOD RIGHT ARM  Result Value Ref Range Status   Specimen Description BLOOD RIGHT ARM  Final   Special Requests   Final    BOTTLES DRAWN AEROBIC AND ANAEROBIC Blood Culture adequate volume   Culture   Final    NO GROWTH 2 DAYS Performed at [facility], [address] [place], [place] [address]    Report Status PENDING  Incomplete  Blood Culture (routine x  2)     Status: None (Preliminary result)   Collection Time: 03/17/20  4:50 PM   Specimen: BLOOD RIGHT HAND  Result Value Ref Range Status   Specimen Description BLOOD RIGHT HAND  Final   Special Requests   Final    BOTTLES DRAWN AEROBIC AND ANAEROBIC Blood Culture adequate volume   Culture   Final    NO GROWTH 2 DAYS Performed at [facility], [address], [place], [place] [address]    Report Status PENDING  Incomplete  SARS Coronavirus 2 by RT PCR (hospital order, performed in [facility] hospital lab) Nasopharyngeal Nasopharyngeal Swab     Status: Abnormal   Collection Time: 03/17/20  5:20 PM   Specimen: Nasopharyngeal Swab  Result Value Ref Range Status   SARS Coronavirus 2 POSITIVE (A) NEGATIVE Final    Comment: RESULT CALLED TO, READ BACK BY AND VERIFIED WITH: [email or web address] @2001  03/17/20 EB (NOTE) SARS-CoV-2 target nucleic acids are DETECTED  SARS-CoV-2 RNA is generally detectable in upper respiratory specimens  during the acute phase of infection.  Positive results are indicative  of the presence of the identified virus, but do not rule out bacterial infection or co-infection with other pathogens not detected by the test.  Clinical correlation with patient history and  other diagnostic information is necessary to determine patient infection status.  The expected result is negative.  Fact Sheet for Patients:   03/19/20   Fact Sheet for Healthcare Providers:   BoilerBrush.com.cy    This test is not yet approved or cleared by the https://pope.com/ FDA and  has been authorized for detection and/or diagnosis of SARS-CoV-2 by FDA under an Emergency Use Authorization (EUA).  This EUA will remain in effect (meaning this test can  be used) for the duration of  the COVID-19 declaration under Section 564(b)(1) of the Act, 21 U.S.C. section 360-bbb-3(b)(1), unless the authorization is terminated or revoked  sooner.  Performed at [facility], [address], [place], [place]     Radiology Reports DG Chest [place] 1 View  Result Date: 03/17/2020 CLINICAL DATA:  64 year old male with shortness of breath. Positive COVID-19. EXAM: PORTABLE CHEST 1 VIEW COMPARISON:  Chest radiograph dated 02/21/2012. FINDINGS: Bilateral mid to lower lung field streaky densities most consistent with multifocal pneumonia and in keeping with provided history of COVID-19. Clinical correlation and follow-up recommended. No pleural effusion pneumothorax.  The cardiac silhouette is within limits. Median sternotomy wires and CABG vascular clips. No acute osseous pathology. IMPRESSION: Multifocal pneumonia. Clinical correlation and follow-up recommended. Electronically Signed   By: Elgie Collard M.D.   On: 03/17/2020 16:03   VAS [place] LOWER EXTREMITY VENOUS (DVT)  Result Date: 03/19/2020  Lower Venous DVTStudy Indications: Covid positive with elevated d-dimer.  Comparison Study: No prior. Performing Technologist: Marilynne Halsted RDMS, RVT  Examination Guidelines: A complete evaluation includes B-mode imaging, spectral Doppler, color Doppler, and power Doppler as needed of all accessible portions of each vessel. Bilateral testing is considered an integral part of a complete examination. Limited examinations for reoccurring indications may be performed as noted. The reflux portion of the exam is performed with the patient in reverse Trendelenburg.  +---------+---------------+---------+-----------+----------+--------------+ RIGHT    CompressibilityPhasicitySpontaneityPropertiesThrombus Aging +---------+---------------+---------+-----------+----------+--------------+ CFV      Full           Yes      Yes                                 +---------+---------------+---------+-----------+----------+--------------+ SFJ      Full                                                         +---------+---------------+---------+-----------+----------+--------------+ FV Prox  Full                                                        +---------+---------------+---------+-----------+----------+--------------+ FV Mid   Full                                                        +---------+---------------+---------+-----------+----------+--------------+ FV DistalFull                                                        +---------+---------------+---------+-----------+----------+--------------+ PFV      Full                                                        +---------+---------------+---------+-----------+----------+--------------+ POP      Full           Yes      Yes                                 +---------+---------------+---------+-----------+----------+--------------+ PTV      Full                                                        +---------+---------------+---------+-----------+----------+--------------+  PERO     Full                                                        +---------+---------------+---------+-----------+----------+--------------+   +---------+---------------+---------+-----------+----------+--------------+ LEFT     CompressibilityPhasicitySpontaneityPropertiesThrombus Aging +---------+---------------+---------+-----------+----------+--------------+ CFV      Full           Yes      Yes                                 +---------+---------------+---------+-----------+----------+--------------+ SFJ      Full                                                        +---------+---------------+---------+-----------+----------+--------------+ FV Prox  Full                                                        +---------+---------------+---------+-----------+----------+--------------+ FV Mid   Full                                                         +---------+---------------+---------+-----------+----------+--------------+ FV DistalFull                                                        +---------+---------------+---------+-----------+----------+--------------+ PFV      Full                                                        +---------+---------------+---------+-----------+----------+--------------+ POP      Full           Yes      Yes                                 +---------+---------------+---------+-----------+----------+--------------+ PTV      Full                                                        +---------+---------------+---------+-----------+----------+--------------+ PERO     Full                                                        +---------+---------------+---------+-----------+----------+--------------+  Summary: RIGHT: - There is no evidence of deep vein thrombosis in the lower extremity.  LEFT: - There is no evidence of deep vein thrombosis in the lower extremity.  *See table(s) above for measurements and observations.    Preliminary

## 2020-03-20 LAB — CBC WITH DIFFERENTIAL/PLATELET
Abs Immature Granulocytes: 0.13 10*3/uL — ABNORMAL HIGH (ref 0.00–0.07)
Basophils Absolute: 0 10*3/uL (ref 0.0–0.1)
Basophils Relative: 0 %
Eosinophils Absolute: 0 10*3/uL (ref 0.0–0.5)
Eosinophils Relative: 0 %
HCT: 35.7 % — ABNORMAL LOW (ref 39.0–52.0)
Hemoglobin: 11.6 g/dL — ABNORMAL LOW (ref 13.0–17.0)
Immature Granulocytes: 1 %
Lymphocytes Relative: 4 %
Lymphs Abs: 0.5 10*3/uL — ABNORMAL LOW (ref 0.7–4.0)
MCH: 29.2 pg (ref 26.0–34.0)
MCHC: 32.5 g/dL (ref 30.0–36.0)
MCV: 89.9 fL (ref 80.0–100.0)
Monocytes Absolute: 0.6 10*3/uL (ref 0.1–1.0)
Monocytes Relative: 5 %
Neutro Abs: 11.2 10*3/uL — ABNORMAL HIGH (ref 1.7–7.7)
Neutrophils Relative %: 90 %
Platelets: 212 10*3/uL (ref 150–400)
RBC: 3.97 MIL/uL — ABNORMAL LOW (ref 4.22–5.81)
RDW: 13 % (ref 11.5–15.5)
WBC: 12.5 10*3/uL — ABNORMAL HIGH (ref 4.0–10.5)
nRBC: 0 % (ref 0.0–0.2)

## 2020-03-20 LAB — COMPREHENSIVE METABOLIC PANEL
ALT: 65 U/L — ABNORMAL HIGH (ref 0–44)
AST: 52 U/L — ABNORMAL HIGH (ref 15–41)
Albumin: 2.3 g/dL — ABNORMAL LOW (ref 3.5–5.0)
Alkaline Phosphatase: 50 U/L (ref 38–126)
Anion gap: 10 (ref 5–15)
BUN: 26 mg/dL — ABNORMAL HIGH (ref 8–23)
CO2: 26 mmol/L (ref 22–32)
Calcium: 8.8 mg/dL — ABNORMAL LOW (ref 8.9–10.3)
Chloride: 104 mmol/L (ref 98–111)
Creatinine, Ser: 1.01 mg/dL (ref 0.61–1.24)
GFR calc Af Amer: >60 mL/min (ref >60)
GFR calc non Af Amer: >60 mL/min (ref >60)
Glucose, Bld: 160 mg/dL — ABNORMAL HIGH (ref 70–99)
Potassium: 4.5 mmol/L (ref 3.5–5.1)
Sodium: 140 mmol/L (ref 135–145)
Total Bilirubin: 0.9 mg/dL (ref 0.3–1.2)
Total Protein: 5.1 g/dL — ABNORMAL LOW (ref 6.5–8.1)

## 2020-03-20 LAB — C-REACTIVE PROTEIN: CRP: 5.1 mg/dL — ABNORMAL HIGH (ref <1.0)

## 2020-03-20 LAB — MAGNESIUM: Magnesium: 2.5 mg/dL — ABNORMAL HIGH (ref 1.7–2.4)

## 2020-03-20 LAB — D-DIMER, QUANTITATIVE: D-Dimer, Quant: 3.16 ug/mL-FEU — ABNORMAL HIGH (ref 0.00–0.50)

## 2020-03-20 LAB — FERRITIN: Ferritin: 1603 ng/mL — ABNORMAL HIGH (ref 24–336)

## 2020-03-20 MED ORDER — METHYLPREDNISOLONE SODIUM SUCC 125 MG IJ SOLR
80.0000 mg | Freq: Two times a day (BID) | INTRAMUSCULAR | Status: DC
  Administered 2020-03-20 – 2020-03-21 (×2): 80 mg via INTRAVENOUS
  Filled 2020-03-20 (×2): qty 2

## 2020-03-20 MED ORDER — SALINE SPRAY 0.65 % NA SOLN
1.0000 | NASAL | Status: DC | PRN
  Administered 2020-03-20: 1 via NASAL
  Filled 2020-03-20: qty 44

## 2020-03-20 NOTE — Progress Notes (Signed)
PROGRESS NOTE                                                                                                                                                                                                             Patient Demographics:    Michael Spears, is a 64 y.o. male, DOB - October 16, 1955, UDJ:[id number]  Outpatient Primary MD for the patient is [name], MD   Admit date - 03/17/2020   LOS - 3  Chief Complaint  Patient presents with  . Shortness of Breath       Brief Narrative: Patient is a 64 y.o. male with PMHx of HTN, HLD, CAD s/p CABG, history of pericardial effusion-presented with 1 week shortness of breath-found to have acute hypoxic respiratory failure secondary to COVID-19 pneumonia.  COVID-19 vaccinated status: Unvaccinated  Significant Events: 8/13>> COVID-19 positive ([facility]) 8/16>> Admit to [facility] for hypoxia from COVID-19 pneumonia  Significant studies: 8/16>>Chest x-ray: Multifocal pneumonia 8/18>> bilateral lower extremity Doppler: No DVT   COVID-19 medications: Steroids: 8/16>> Remdesivir: 8/16>> Baricitinib: 8/16>>  Antibiotics: Rocephin: 8/17>>8/19 Zithromax: 8/17>>8/19  Microbiology data: 8/16 >>blood culture: No growth  Procedures: None  Consults: None  DVT prophylaxis: Prophylactic Lovenox at twice daily dosing    Subjective:   He remains unchanged-on NRB with 15 L of HFNC.   Assessment  & Plan :   Acute Hypoxic Resp Failure due to Covid 19 Viral pneumonia: Continues to be severely hypoxic-on 15 L of HFNC plus NRB-however comfortable at rest-and not in any distress.  CRP trending down.  Remains on high-dose Solu-Medrol-with plans to taper off slowly in the next few days-continue Remdesivir and bursectomy.  Continue to monitor closely-if he worsens significantly or if he develops respiratory distress-then will need PCCM evaluation for ICU  transfer.  Fever: afebrile O2 requirements:  SpO2: 90 % O2 Flow Rate (L/min): 15 L/min   COVID-19 Labs: Recent Labs    03/17/20 1545 03/17/20 1545 03/18/20 0408 03/19/20 0327 03/20/20 0500  DDIMER 3.45*   < > 4.34* 3.84* 3.16*  FERRITIN 1,911*   < > 2,710* 2,737* 1,603*  LDH 229*  --   --   --   --   CRP 22.7*   < > 21.8* 10.4* 5.1*   < > = values in this interval not displayed.    No results  found for: BNP  Recent Labs  Lab 03/17/20 1545  PROCALCITON 0.56    Lab Results  Component Value Date   SARSCOV2NAA POSITIVE (A) 03/17/2020     Prone/Incentive Spirometry: encouraged patient to lie prone for 3-4 hours at a time for a total of 16 hours a day, and to encourage incentive spirometry use 3-4/hour.  Elevated D-dimer: Hypoxia remains stable without any further worsening-lower extremity Doppler negative-on intermediate dosing of Lovenox.  Follow periodically.  If hypoxia worsens-we will plan CTA chest.  Transaminitis: Mild-downtrending-stable for continued follow-up.  Etiology likely secondary to COVID-19.  CAD-s/p CABG 2013: No anginal symptoms-continue aspirin/Plavix/beta-blocker/statin.    HTN: BP stable-continue amlodipine/metoprolol.    ABG:    Component Value Date/Time   PHART 7.311 (L) 12/10/2011 1847   PCO2ART 43.0 12/10/2011 1847   PO2ART 83.0 12/10/2011 1847   HCO3 21.7 12/10/2011 1847   TCO2 27 12/29/2011 0112   ACIDBASEDEF 4.0 (H) 12/10/2011 1847   O2SAT 95.0 12/10/2011 1847    Vent Settings: N/A  Condition - Extremely Guarded  Family Communication  :  Sister in law -valerie-([phone]) over the phone 8/19  Code Status :  Full Code  Diet :  Diet Order            Diet Heart Room service appropriate? Yes; Fluid consistency: Thin  Diet effective now                  Disposition Plan  :   Status is: Inpatient  Remains inpatient appropriate because:Inpatient level of care appropriate due to severity of illness   Dispo: The  patient is from: Home              Anticipated d/c is to: TBS              Anticipated d/c date is: > 3 days              Patient currently is not medically stable to d/c.   Barriers to discharge: Hypoxia requiring O2 supplementation/complete 5 days of IV Remdesivir  Antimicorbials  :    Anti-infectives (From admission, onward)   Start     Dose/Rate Route Frequency Ordered Stop   03/18/20 1415  azithromycin (ZITHROMAX) 500 mg in sodium chloride 0.9 % 250 mL IVPB        500 mg 250 mL/hr over 60 Minutes Intravenous Every 24 hours 03/18/20 1414     03/18/20 1415  cefTRIAXone (ROCEPHIN) 1 g in sodium chloride 0.9 % 100 mL IVPB        1 g 200 mL/hr over 30 Minutes Intravenous Every 24 hours 03/18/20 1414     03/18/20 1000  remdesivir 100 mg in sodium chloride 0.9 % 100 mL IVPB  Status:  Discontinued       "Followed by" Linked Group Details   100 mg 200 mL/hr over 30 Minutes Intravenous Daily 03/17/20 1833 03/17/20 1835   03/18/20 1000  remdesivir 100 mg in sodium chloride 0.9 % 100 mL IVPB       "Followed by" Linked Group Details   100 mg 200 mL/hr over 30 Minutes Intravenous Every 24 hours 03/17/20 1826 03/22/20 0959   03/17/20 1930  remdesivir 200 mg in sodium chloride 0.9% 250 mL IVPB       "Followed by" Linked Group Details   200 mg 580 mL/hr over 30 Minutes Intravenous Once 03/17/20 1826 03/17/20 2044   03/17/20 1833  remdesivir 200 mg in sodium chloride 0.9% 250  mL IVPB  Status:  Discontinued       "Followed by" Linked Group Details   200 mg 580 mL/hr over 30 Minutes Intravenous Once 03/17/20 1833 03/17/20 1835      Inpatient Medications  Scheduled Meds: . albuterol  2 puff Inhalation Q6H  . amLODipine  5 mg Oral Daily  . vitamin C  500 mg Oral Daily  . aspirin EC  81 mg Oral Daily  . atorvastatin  80 mg Oral Daily  . baricitinib  4 mg Oral Daily  . clopidogrel  75 mg Oral Daily  . enoxaparin (LOVENOX) injection  45 mg Subcutaneous Q12H  . feeding supplement (ENSURE  ENLIVE)  237 mL Oral BID BM  . methylPREDNISolone (SOLU-MEDROL) injection  60 mg Intravenous Q8H  . metoprolol tartrate  25 mg Oral BID  . pantoprazole  40 mg Oral Daily  . ranolazine  500 mg Oral BID  . sodium chloride flush  3 mL Intravenous Q12H  . zinc sulfate  220 mg Oral Daily   Continuous Infusions: . sodium chloride    . azithromycin 500 mg (03/19/20 1323)  . cefTRIAXone (ROCEPHIN)  IV 1 g (03/19/20 1531)  . remdesivir 100 mg in NS 100 mL 100 mg (03/20/20 0926)   PRN Meds:.sodium chloride, acetaminophen, guaiFENesin-dextromethorphan, loperamide, nitroGLYCERIN, ondansetron **OR** ondansetron (ZOFRAN) IV, sodium chloride, sodium chloride flush   Time Spent in minutes  35   See all Orders from today for further details  Jeoffrey Massed M.D on 03/20/2020 at 1:51 PM  To page go to [email or web address] - use universal password  [facility] -  Office  [phone]    Objective:   Vitals:   03/20/20 0745 03/20/20 0800 03/20/20 0910 03/20/20 1155  BP: 100/60  110/62 109/66  Pulse: 71 74 70 77  Resp: (!) 25 20 16  (!) 24  Temp: 97.6 F (36.4 C)  97.6 F (36.4 C) 97.7 F (36.5 C)  TempSrc: Oral  Oral Oral  SpO2: 90% (!) 89% 92% 90%  Weight:      Height:        Wt Readings from Last 3 Encounters:  03/17/20 88.5 kg  10/10/19 90 kg  03/28/19 87.5 kg     Intake/Output Summary (Last 24 hours) at 03/20/2020 1351 Last data filed at 03/20/2020 0916 Gross per 24 hour  Intake 3 ml  Output --  Net 3 ml     Physical Exam Gen Exam:Alert awake-not in any distress HEENT:atraumatic, normocephalic Chest: B/L clear to auscultation anteriorly CVS:S1S2 regular Abdomen:soft non tender, non distended Extremities:no edema Neurology: Non focal Skin: no rash   Data Review:    CBC Recent Labs  Lab 03/17/20 1545 03/18/20 0408 03/19/20 0327 03/20/20 0500  WBC 15.0* 7.5 10.9* 12.5*  HGB 12.7* 13.6 12.5* 11.6*  HCT 37.6* 40.2 36.8* 35.7*  PLT 140* 177 193 212  MCV 87.6  88.4 88.2 89.9  MCH 29.6 29.9 30.0 29.2  MCHC 33.8 33.8 34.0 32.5  RDW 12.7 12.8 13.0 13.0  LYMPHSABS 0.5* 0.3* 0.7 0.5*  MONOABS 0.5 0.1 0.5 0.6  EOSABS 0.0 0.0 0.0 0.0  BASOSABS 0.0 0.0 0.0 0.0    Chemistries  Recent Labs  Lab 03/17/20 1545 03/18/20 0408 03/19/20 0327 03/20/20 0500  NA 136 135 136 140  K 3.5 3.8 4.2 4.5  CL 103 100 103 104  CO2 23 22 22 26   GLUCOSE 102* 160* 159* 160*  BUN 34* 26* 25* 26*  CREATININE 1.27* 1.05 0.93 1.01  CALCIUM 8.8*  8.7* 8.5* 8.8*  MG  --  2.3 2.6* 2.5*  AST 51* 69* 57* 52*  ALT 41 59* 59* 65*  ALKPHOS 58 63 56 50  BILITOT 1.1 1.0 1.0 0.9   ------------------------------------------------------------------------------------------------------------------ Recent Labs    03/17/20 1545  TRIG 202*    Lab Results  Component Value Date   HGBA1C 5.9 (H) 12/06/2011   ------------------------------------------------------------------------------------------------------------------ No results for input(s): TSH, T4TOTAL, T3FREE, THYROIDAB in the last 72 hours.  Invalid input(s): FREET3 ------------------------------------------------------------------------------------------------------------------ Recent Labs    03/19/20 0327 03/20/20 0500  FERRITIN 2,737* 1,603*    Coagulation profile No results for input(s): INR, PROTIME in the last 168 hours.  Recent Labs    03/19/20 0327 03/20/20 0500  DDIMER 3.84* 3.16*    Cardiac Enzymes No results for input(s): CKMB, TROPONINI, MYOGLOBIN in the last 168 hours.  Invalid input(s): CK ------------------------------------------------------------------------------------------------------------------ No results found for: BNP  Micro Results Recent Results (from the past 240 hour(s))  Blood Culture (routine x 2)     Status: None (Preliminary result)   Collection Time: 03/17/20  4:20 PM   Specimen: BLOOD RIGHT ARM  Result Value Ref Range Status   Specimen Description BLOOD RIGHT  ARM  Final   Special Requests   Final    BOTTLES DRAWN AEROBIC AND ANAEROBIC Blood Culture adequate volume   Culture   Final    NO GROWTH 3 DAYS Performed at [facility], [address] [place], [place] [address]    Report Status PENDING  Incomplete  Blood Culture (routine x 2)     Status: None (Preliminary result)   Collection Time: 03/17/20  4:50 PM   Specimen: BLOOD RIGHT HAND  Result Value Ref Range Status   Specimen Description BLOOD RIGHT HAND  Final   Special Requests   Final    BOTTLES DRAWN AEROBIC AND ANAEROBIC Blood Culture adequate volume   Culture   Final    NO GROWTH 3 DAYS Performed at [facility], [address] [place], [place] [address]    Report Status PENDING  Incomplete  SARS Coronavirus 2 by RT PCR (hospital order, performed in [facility] hospital lab) Nasopharyngeal Nasopharyngeal Swab     Status: Abnormal   Collection Time: 03/17/20  5:20 PM   Specimen: Nasopharyngeal Swab  Result Value Ref Range Status   SARS Coronavirus 2 POSITIVE (A) NEGATIVE Final    Comment: RESULT CALLED TO, READ BACK BY AND VERIFIED WITH: [email or web address] @2001  03/17/20 EB (NOTE) SARS-CoV-2 target nucleic acids are DETECTED  SARS-CoV-2 RNA is generally detectable in upper respiratory specimens  during the acute phase of infection.  Positive results are indicative  of the presence of the identified virus, but do not rule out bacterial infection or co-infection with other pathogens not detected by the test.  Clinical correlation with patient history and  other diagnostic information is necessary to determine patient infection status.  The expected result is negative.  Fact Sheet for Patients:   BoilerBrush.com.cyhttps://www.fda.gov/media/136312/download   Fact Sheet for Healthcare Providers:   https://pope.com/https://www.fda.gov/media/136313/download    This test is not yet approved or cleared by the [place] FDA and  has been authorized for detection and/or diagnosis of SARS-CoV-2 by FDA  under an Emergency Use Authorization (EUA).  This EUA will remain in effect (meaning this test can  be used) for the duration of  the COVID-19 declaration under Section 564(b)(1) of the Act, 21 U.S.C. section 360-bbb-3(b)(1), unless the authorization is terminated or revoked sooner.  Performed  at [facility], [address] [place], [place] [address]     Radiology Reports DG Chest [place] 1 View  Result Date: 03/17/2020 CLINICAL DATA:  64 year old male with shortness of breath. Positive COVID-19. EXAM: PORTABLE CHEST 1 VIEW COMPARISON:  Chest radiograph dated 02/21/2012. FINDINGS: Bilateral mid to lower lung field streaky densities most consistent with multifocal pneumonia and in keeping with provided history of COVID-19. Clinical correlation and follow-up recommended. No pleural effusion pneumothorax. The cardiac silhouette is within limits. Median sternotomy wires and CABG vascular clips. No acute osseous pathology. IMPRESSION: Multifocal pneumonia. Clinical correlation and follow-up recommended. Electronically Signed   By: Elgie Collard M.D.   On: 03/17/2020 16:03   VAS [place] LOWER EXTREMITY VENOUS (DVT)  Result Date: 03/19/2020  Lower Venous DVTStudy Indications: Covid positive with elevated d-dimer.  Comparison Study: No prior. Performing Technologist: Marilynne Halsted RDMS, RVT  Examination Guidelines: A complete evaluation includes B-mode imaging, spectral Doppler, color Doppler, and power Doppler as needed of all accessible portions of each vessel. Bilateral testing is considered an integral part of a complete examination. Limited examinations for reoccurring indications may be performed as noted. The reflux portion of the exam is performed with the patient in reverse Trendelenburg.  +---------+---------------+---------+-----------+----------+--------------+ RIGHT    CompressibilityPhasicitySpontaneityPropertiesThrombus Aging  +---------+---------------+---------+-----------+----------+--------------+ CFV      Full           Yes      Yes                                 +---------+---------------+---------+-----------+----------+--------------+ SFJ      Full                                                        +---------+---------------+---------+-----------+----------+--------------+ FV Prox  Full                                                        +---------+---------------+---------+-----------+----------+--------------+ FV Mid   Full                                                        +---------+---------------+---------+-----------+----------+--------------+ FV DistalFull                                                        +---------+---------------+---------+-----------+----------+--------------+ PFV      Full                                                        +---------+---------------+---------+-----------+----------+--------------+ POP      Full  Yes      Yes                                 +---------+---------------+---------+-----------+----------+--------------+ PTV      Full                                                        +---------+---------------+---------+-----------+----------+--------------+ PERO     Full                                                        +---------+---------------+---------+-----------+----------+--------------+   +---------+---------------+---------+-----------+----------+--------------+ LEFT     CompressibilityPhasicitySpontaneityPropertiesThrombus Aging +---------+---------------+---------+-----------+----------+--------------+ CFV      Full           Yes      Yes                                 +---------+---------------+---------+-----------+----------+--------------+ SFJ      Full                                                         +---------+---------------+---------+-----------+----------+--------------+ FV Prox  Full                                                        +---------+---------------+---------+-----------+----------+--------------+ FV Mid   Full                                                        +---------+---------------+---------+-----------+----------+--------------+ FV DistalFull                                                        +---------+---------------+---------+-----------+----------+--------------+ PFV      Full                                                        +---------+---------------+---------+-----------+----------+--------------+ POP      Full           Yes      Yes                                 +---------+---------------+---------+-----------+----------+--------------+ PTV  Full                                                        +---------+---------------+---------+-----------+----------+--------------+ PERO     Full                                                        +---------+---------------+---------+-----------+----------+--------------+     Summary: RIGHT: - There is no evidence of deep vein thrombosis in the lower extremity.  LEFT: - There is no evidence of deep vein thrombosis in the lower extremity.  *See table(s) above for measurements and observations. Electronically signed by Gretta Began MD on 03/19/2020 at 5:03:52 PM.    Final

## 2020-03-20 NOTE — TOC Initial Note (Signed)
Transition of Care [facility]) - Initial/Assessment Note    Patient Details  Name: Michael Spears MRN: [id number] Date of Birth: 1956-07-28  Transition of Care [facility]) CM/SW Contact:    Lockie Pares, RN Phone Number: 03/20/2020, 4:12 PM  Clinical Narrative:                 Patient admitted Day # 3 of treatment for COVID pneumonia receiving IV treatments high flow oxygen . Family member, who lives in same house just admitted for same. Will follow for needs will likely need oxygen for home  Expected Discharge Plan: Home/Self Care Barriers to Discharge: Continued Medical Work up   Patient Goals and CMS Choice        Expected Discharge Plan and Services Expected Discharge Plan: Home/Self Care                                              Prior Living Arrangements/Services   Lives with:: Relatives Patient language and need for interpreter reviewed:: Yes        Need for Family Participation in Patient Care: Yes (Comment) Care giver support system in place?: Yes (comment)   Criminal Activity/Legal Involvement Pertinent to Current Situation/Hospitalization: No - Comment as needed  Activities of Daily Living Home Assistive Devices/Equipment: None ADL Screening (condition at time of admission) Patient's cognitive ability adequate to safely complete daily activities?: Yes Is the patient deaf or have difficulty hearing?: No Does the patient have difficulty seeing, even when wearing glasses/contacts?: No Does the patient have difficulty concentrating, remembering, or making decisions?: No Patient able to express need for assistance with ADLs?: Yes Does the patient have difficulty dressing or bathing?: No Independently performs ADLs?: Yes (appropriate for developmental age) Does the patient have difficulty walking or climbing stairs?: No Weakness of Legs: Both Weakness of Arms/Hands: None  Permission Sought/Granted                  Emotional Assessment        Orientation: : Oriented to Self, Oriented to Place, Oriented to  Time, Oriented to Situation   Psych Involvement: No (comment)  Admission diagnosis:  Hypoxia [R09.02] Pneumonia due to COVID-19 virus [U07.1, J12.82] Acute respiratory disease due to COVID-19 virus [U07.1, J06.9] COVID-19 [U07.1] Patient Active Problem List   Diagnosis Date Noted   Acute respiratory disease due to COVID-19 virus 03/18/2020   Pneumonia due to COVID-19 virus 03/17/2020   CAD S/P CFX DES June 2013 03/28/2015   Essential hypertension 03/28/2015   Cough 02/21/2012   NSTEMI (non-ST elevated myocardial infarction) (HCC) 01/15/2012   Pericardial effusion    Dyspnea 12/29/2011   S/P CABG x 04 Dec 2011 12/09/2011   Hyperlipidemia 12/07/2011   Unstable angina (HCC) 12/06/2011   PYELONEPHRITIS, ACUTE 01/29/2008   ACUTE PROSTATITIS 01/29/2008   NECK PAIN, CHRONIC 08/11/2007   FIBROMYALGIA, SEVERE 04/26/2007   HEADACHE 04/12/2007   PCP:  [name], MD Pharmacy:   [id number] - [place], [place] - [address] [place] [address] Phone: [phone] Fax: [phone]     Social Determinants of Health (SDOH) Interventions    Readmission Risk Interventions No flowsheet data found.

## 2020-03-20 NOTE — Progress Notes (Signed)
Ok to stop ceftriaxone/azith after dose today per Dr. Windell Norfolk.  Ulyses Southward, PharmD, BCIDP, AAHIVP, CPP Infectious Disease Pharmacist 03/20/2020 1:58 PM

## 2020-03-21 LAB — COMPREHENSIVE METABOLIC PANEL
ALT: 72 U/L — ABNORMAL HIGH (ref 0–44)
AST: 46 U/L — ABNORMAL HIGH (ref 15–41)
Albumin: 2.6 g/dL — ABNORMAL LOW (ref 3.5–5.0)
Alkaline Phosphatase: 55 U/L (ref 38–126)
Anion gap: 6 (ref 5–15)
BUN: 25 mg/dL — ABNORMAL HIGH (ref 8–23)
CO2: 26 mmol/L (ref 22–32)
Calcium: 8.6 mg/dL — ABNORMAL LOW (ref 8.9–10.3)
Chloride: 104 mmol/L (ref 98–111)
Creatinine, Ser: 1.02 mg/dL (ref 0.61–1.24)
GFR calc Af Amer: >60 mL/min (ref >60)
GFR calc non Af Amer: >60 mL/min (ref >60)
Glucose, Bld: 169 mg/dL — ABNORMAL HIGH (ref 70–99)
Potassium: 4.9 mmol/L (ref 3.5–5.1)
Sodium: 136 mmol/L (ref 135–145)
Total Bilirubin: 1 mg/dL (ref 0.3–1.2)
Total Protein: 5.3 g/dL — ABNORMAL LOW (ref 6.5–8.1)

## 2020-03-21 LAB — CBC WITH DIFFERENTIAL/PLATELET
Abs Immature Granulocytes: 0.09 10*3/uL — ABNORMAL HIGH (ref 0.00–0.07)
Basophils Absolute: 0 10*3/uL (ref 0.0–0.1)
Basophils Relative: 0 %
Eosinophils Absolute: 0 10*3/uL (ref 0.0–0.5)
Eosinophils Relative: 0 %
HCT: 38 % — ABNORMAL LOW (ref 39.0–52.0)
Hemoglobin: 12.2 g/dL — ABNORMAL LOW (ref 13.0–17.0)
Immature Granulocytes: 1 %
Lymphocytes Relative: 3 %
Lymphs Abs: 0.3 10*3/uL — ABNORMAL LOW (ref 0.7–4.0)
MCH: 29.3 pg (ref 26.0–34.0)
MCHC: 32.1 g/dL (ref 30.0–36.0)
MCV: 91.1 fL (ref 80.0–100.0)
Monocytes Absolute: 0.6 10*3/uL (ref 0.1–1.0)
Monocytes Relative: 6 %
Neutro Abs: 9.6 10*3/uL — ABNORMAL HIGH (ref 1.7–7.7)
Neutrophils Relative %: 90 %
Platelets: 208 10*3/uL (ref 150–400)
RBC: 4.17 MIL/uL — ABNORMAL LOW (ref 4.22–5.81)
RDW: 12.8 % (ref 11.5–15.5)
WBC: 10.6 10*3/uL — ABNORMAL HIGH (ref 4.0–10.5)
nRBC: 0 % (ref 0.0–0.2)

## 2020-03-21 LAB — FERRITIN: Ferritin: 1134 ng/mL — ABNORMAL HIGH (ref 24–336)

## 2020-03-21 LAB — MAGNESIUM: Magnesium: 2.5 mg/dL — ABNORMAL HIGH (ref 1.7–2.4)

## 2020-03-21 LAB — C-REACTIVE PROTEIN: CRP: 2.8 mg/dL — ABNORMAL HIGH (ref <1.0)

## 2020-03-21 LAB — D-DIMER, QUANTITATIVE: D-Dimer, Quant: 3.09 ug/mL-FEU — ABNORMAL HIGH (ref 0.00–0.50)

## 2020-03-21 MED ORDER — METHYLPREDNISOLONE SODIUM SUCC 125 MG IJ SOLR
60.0000 mg | Freq: Two times a day (BID) | INTRAMUSCULAR | Status: DC
  Administered 2020-03-21 – 2020-03-24 (×6): 60 mg via INTRAVENOUS
  Filled 2020-03-21 (×6): qty 2

## 2020-03-21 NOTE — Progress Notes (Signed)
PROGRESS NOTE                                                                                                                                                                                                             Patient Demographics:    Michael Spears, is a 64 y.o. male, DOB - 03/11/1956, ZOX:[id number]  Outpatient Primary MD for the patient is Michael James, MD   Admit date - 03/17/2020   LOS - 4  Chief Complaint  Patient presents with  . Shortness of Breath       Brief Narrative: Patient is a 64 y.o. male with PMHx of HTN, HLD, CAD s/p CABG, history of pericardial effusion-presented with 1 week shortness of breath-found to have acute hypoxic respiratory failure secondary to COVID-19 pneumonia.  COVID-19 vaccinated status: Unvaccinated  Significant Events: 8/13>> COVID-19 positive ([facility]) 8/16>> Admit to [facility] for hypoxia from COVID-19 pneumonia  Significant studies: 8/16>>Chest x-ray: Multifocal pneumonia 8/18>> bilateral lower extremity Doppler: No DVT   COVID-19 medications: Steroids: 8/16>> Remdesivir: 8/16>> 8/20 Baricitinib: 8/16>>  Antibiotics: Rocephin: 8/17>>8/19 Zithromax: 8/17>>8/19  Microbiology data: 8/16 >>blood culture: No growth  Procedures: None  Consults: None  DVT prophylaxis: Prophylactic Lovenox at twice daily dosing    Subjective:   Unchanged-remains on NRB and HFNC.  Acknowledges that he gets short of breath with movement.  Appears very comfortable at rest   Assessment  & Plan :   Acute Hypoxic Resp Failure due to Covid 19 Viral pneumonia: Continues to have severe hypoxia-but stable on 15 L of HFNC and NRB.  Comfortable at rest.  CRP continues to trend down-continue to attempt to titrate down FiO2.  Has been on high-dose Solu-Medrol for the past few days-we will slightly taper today.  Remains on baricitinib.  Will complete Remdesivir 8/20.   Continue  to monitor closely-if he worsens significantly or if he develops respiratory distress-then will need PCCM evaluation for ICU transfer.  Fever: afebrile O2 requirements:  SpO2: 98 % O2 Flow Rate (L/min): 15 L/min   COVID-19 Labs: Recent Labs    03/19/20 0327 03/20/20 0500  DDIMER 3.84* 3.16*  FERRITIN 2,737* 1,603*  CRP 10.4* 5.1*    No results found for: BNP  Recent Labs  Lab 03/17/20 1545  PROCALCITON 0.56    Lab Results  Component Value Date  SARSCOV2NAA POSITIVE (A) 03/17/2020     Prone/Incentive Spirometry: encouraged patient to lie prone for 3-4 hours at a time for a total of 16 hours a day, and to encourage incentive spirometry use 3-4/hour.  Elevated D-dimer: Hypoxia remains stable without any further worsening-lower extremity Doppler negative-on intermediate dosing of Lovenox.  Follow periodically.  If hypoxia worsens-we will plan CTA chest.  Transaminitis: Mild-downtrending-stable for continued follow-up.  Etiology likely secondary to COVID-19.  CAD-s/p CABG 2013: No anginal symptoms-continue aspirin/Plavix/beta-blocker/statin.    HTN: BP stable-continue amlodipine/metoprolol.    ABG:    Component Value Date/Time   PHART 7.311 (L) 12/10/2011 1847   PCO2ART 43.0 12/10/2011 1847   PO2ART 83.0 12/10/2011 1847   HCO3 21.7 12/10/2011 1847   TCO2 27 12/29/2011 0112   ACIDBASEDEF 4.0 (H) 12/10/2011 1847   O2SAT 95.0 12/10/2011 1847    Vent Settings: N/A  Condition - Extremely Guarded  Family Communication  :  Sister in law -valerie-([phone]) left a voicemail on 8/20  Code Status :  Full Code  Diet :  Diet Order            Diet Heart Room service appropriate? Yes; Fluid consistency: Thin  Diet effective now                  Disposition Plan  :   Status is: Inpatient  Remains inpatient appropriate because:Inpatient level of care appropriate due to severity of illness  Dispo: The patient is from: Home              Anticipated d/c is to:  TBS              Anticipated d/c date is: > 3 days              Patient currently is not medically stable to d/c.   Barriers to discharge: Hypoxia requiring O2 supplementation/complete 5 days of IV Remdesivir  Antimicorbials  :    Anti-infectives (From admission, onward)   Start     Dose/Rate Route Frequency Ordered Stop   03/18/20 1415  azithromycin (ZITHROMAX) 500 mg in sodium chloride 0.9 % 250 mL IVPB        500 mg 250 mL/hr over 60 Minutes Intravenous Every 24 hours 03/18/20 1414 03/20/20 1717   03/18/20 1415  cefTRIAXone (ROCEPHIN) 1 g in sodium chloride 0.9 % 100 mL IVPB        1 g 200 mL/hr over 30 Minutes Intravenous Every 24 hours 03/18/20 1414 03/20/20 1435   03/18/20 1000  remdesivir 100 mg in sodium chloride 0.9 % 100 mL IVPB  Status:  Discontinued       "Followed by" Linked Group Details   100 mg 200 mL/hr over 30 Minutes Intravenous Daily 03/17/20 1833 03/17/20 1835   03/18/20 1000  remdesivir 100 mg in sodium chloride 0.9 % 100 mL IVPB       "Followed by" Linked Group Details   100 mg 200 mL/hr over 30 Minutes Intravenous Every 24 hours 03/17/20 1826 03/22/20 0959   03/17/20 1930  remdesivir 200 mg in sodium chloride 0.9% 250 mL IVPB       "Followed by" Linked Group Details   200 mg 580 mL/hr over 30 Minutes Intravenous Once 03/17/20 1826 03/17/20 2044   03/17/20 1833  remdesivir 200 mg in sodium chloride 0.9% 250 mL IVPB  Status:  Discontinued       "Followed by" Linked Group Details   200 mg 580 mL/hr over  30 Minutes Intravenous Once 03/17/20 1833 03/17/20 1835      Inpatient Medications  Scheduled Meds: . albuterol  2 puff Inhalation Q6H  . amLODipine  5 mg Oral Daily  . vitamin C  500 mg Oral Daily  . aspirin EC  81 mg Oral Daily  . atorvastatin  80 mg Oral Daily  . baricitinib  4 mg Oral Daily  . clopidogrel  75 mg Oral Daily  . enoxaparin (LOVENOX) injection  45 mg Subcutaneous Q12H  . feeding supplement (ENSURE ENLIVE)  237 mL Oral BID BM  .  methylPREDNISolone (SOLU-MEDROL) injection  80 mg Intravenous Q12H  . metoprolol tartrate  25 mg Oral BID  . pantoprazole  40 mg Oral Daily  . ranolazine  500 mg Oral BID  . sodium chloride flush  3 mL Intravenous Q12H  . zinc sulfate  220 mg Oral Daily   Continuous Infusions: . sodium chloride    . remdesivir 100 mg in NS 100 mL Stopped (03/20/20 1000)   PRN Meds:.sodium chloride, acetaminophen, guaiFENesin-dextromethorphan, loperamide, nitroGLYCERIN, ondansetron **OR** ondansetron (ZOFRAN) IV, sodium chloride, sodium chloride flush   Time Spent in minutes  35   See all Orders from today for further details  Jeoffrey Massed M.D on 03/21/2020 at 7:27 AM  To page go to [email or web address] - use universal password  [facility] -  Office  [phone]    Objective:   Vitals:   03/20/20 1727 03/20/20 1931 03/21/20 0007 03/21/20 0544  BP: 115/63 (!) 111/50 114/68 100/63  Pulse: 79 65 60 64  Resp: (!) 25 20 (!) 22 18  Temp: 97.6 F (36.4 C) 98 F (36.7 C) 97.6 F (36.4 C) 98 F (36.7 C)  TempSrc: Oral Oral Axillary Oral  SpO2: (!) 88% 95% 97% 98%  Weight:      Height:        Wt Readings from Last 3 Encounters:  03/17/20 88.5 kg  10/10/19 90 kg  03/28/19 87.5 kg     Intake/Output Summary (Last 24 hours) at 03/21/2020 0727 Last data filed at 03/20/2020 0916 Gross per 24 hour  Intake 3 ml  Output --  Net 3 ml     Physical Exam Gen Exam:Alert awake-not in any distress HEENT:atraumatic, normocephalic Chest: B/L clear to auscultation anteriorly CVS:S1S2 regular Abdomen:soft non tender, non distended Extremities:no edema Neurology: Non focal Skin: no rash   Data Review:    CBC Recent Labs  Lab 03/17/20 1545 03/18/20 0408 03/19/20 0327 03/20/20 0500  WBC 15.0* 7.5 10.9* 12.5*  HGB 12.7* 13.6 12.5* 11.6*  HCT 37.6* 40.2 36.8* 35.7*  PLT 140* 177 193 212  MCV 87.6 88.4 88.2 89.9  MCH 29.6 29.9 30.0 29.2  MCHC 33.8 33.8 34.0 32.5  RDW 12.7 12.8 13.0  13.0  LYMPHSABS 0.5* 0.3* 0.7 0.5*  MONOABS 0.5 0.1 0.5 0.6  EOSABS 0.0 0.0 0.0 0.0  BASOSABS 0.0 0.0 0.0 0.0    Chemistries  Recent Labs  Lab 03/17/20 1545 03/18/20 0408 03/19/20 0327 03/20/20 0500  NA 136 135 136 140  K 3.5 3.8 4.2 4.5  CL 103 100 103 104  CO2 23 22 22 26   GLUCOSE 102* 160* 159* 160*  BUN 34* 26* 25* 26*  CREATININE 1.27* 1.05 0.93 1.01  CALCIUM 8.8* 8.7* 8.5* 8.8*  MG  --  2.3 2.6* 2.5*  AST 51* 69* 57* 52*  ALT 41 59* 59* 65*  ALKPHOS 58 63 56 50  BILITOT 1.1 1.0 1.0 0.9   ------------------------------------------------------------------------------------------------------------------  No results for input(s): CHOL, HDL, LDLCALC, TRIG, CHOLHDL, LDLDIRECT in the last 72 hours.  Lab Results  Component Value Date   HGBA1C 5.9 (H) 12/06/2011   ------------------------------------------------------------------------------------------------------------------ No results for input(s): TSH, T4TOTAL, T3FREE, THYROIDAB in the last 72 hours.  Invalid input(s): FREET3 ------------------------------------------------------------------------------------------------------------------ Recent Labs    03/19/20 0327 03/20/20 0500  FERRITIN 2,737* 1,603*    Coagulation profile No results for input(s): INR, PROTIME in the last 168 hours.  Recent Labs    03/19/20 0327 03/20/20 0500  DDIMER 3.84* 3.16*    Cardiac Enzymes No results for input(s): CKMB, TROPONINI, MYOGLOBIN in the last 168 hours.  Invalid input(s): CK ------------------------------------------------------------------------------------------------------------------ No results found for: BNP  Micro Results Recent Results (from the past 240 hour(s))  Blood Culture (routine x 2)     Status: None (Preliminary result)   Collection Time: 03/17/20  4:20 PM   Specimen: BLOOD RIGHT ARM  Result Value Ref Range Status   Specimen Description BLOOD RIGHT ARM  Final   Special Requests   Final     BOTTLES DRAWN AEROBIC AND ANAEROBIC Blood Culture adequate volume   Culture   Final    NO GROWTH 4 DAYS Performed at [facility], [address] [place], [place] [address]    Report Status PENDING  Incomplete  Blood Culture (routine x 2)     Status: None (Preliminary result)   Collection Time: 03/17/20  4:50 PM   Specimen: BLOOD RIGHT HAND  Result Value Ref Range Status   Specimen Description BLOOD RIGHT HAND  Final   Special Requests   Final    BOTTLES DRAWN AEROBIC AND ANAEROBIC Blood Culture adequate volume   Culture   Final    NO GROWTH 4 DAYS Performed at [facility], [address], [place], [place] [address]    Report Status PENDING  Incomplete  SARS Coronavirus 2 by RT PCR (hospital order, performed in [facility] hospital lab) Nasopharyngeal Nasopharyngeal Swab     Status: Abnormal   Collection Time: 03/17/20  5:20 PM   Specimen: Nasopharyngeal Swab  Result Value Ref Range Status   SARS Coronavirus 2 POSITIVE (A) NEGATIVE Final    Comment: RESULT CALLED TO, READ BACK BY AND VERIFIED WITH: J,[email or web address] @2001  03/17/20 EB (NOTE) SARS-CoV-2 target nucleic acids are DETECTED  SARS-CoV-2 RNA is generally detectable in upper respiratory specimens  during the acute phase of infection.  Positive results are indicative  of the presence of the identified virus, but do not rule out bacterial infection or co-infection with other pathogens not detected by the test.  Clinical correlation with patient history and  other diagnostic information is necessary to determine patient infection status.  The expected result is negative.  Fact Sheet for Patients:   03/19/20   Fact Sheet for Healthcare Providers:   BoilerBrush.com.cy    This test is not yet approved or cleared by the https://pope.com/ FDA and  has been authorized for detection and/or diagnosis of SARS-CoV-2 by FDA under an Emergency Use Authorization (EUA).   This EUA will remain in effect (meaning this test can  be used) for the duration of  the COVID-19 declaration under Section 564(b)(1) of the Act, 21 U.S.C. section 360-bbb-3(b)(1), unless the authorization is terminated or revoked sooner.  Performed at [facility], [address], [place], [place]     Radiology Reports DG Chest [place] 1 View  Result Date: 03/17/2020 CLINICAL DATA:  64 year old male with shortness of  breath. Positive COVID-19. EXAM: PORTABLE CHEST 1 VIEW COMPARISON:  Chest radiograph dated 02/21/2012. FINDINGS: Bilateral mid to lower lung field streaky densities most consistent with multifocal pneumonia and in keeping with provided history of COVID-19. Clinical correlation and follow-up recommended. No pleural effusion pneumothorax. The cardiac silhouette is within limits. Median sternotomy wires and CABG vascular clips. No acute osseous pathology. IMPRESSION: Multifocal pneumonia. Clinical correlation and follow-up recommended. Electronically Signed   By: Elgie Collard M.D.   On: 03/17/2020 16:03   VAS [place] LOWER EXTREMITY VENOUS (DVT)  Result Date: 03/19/2020  Lower Venous DVTStudy Indications: Covid positive with elevated d-dimer.  Comparison Study: No prior. Performing Technologist: Marilynne Halsted RDMS, RVT  Examination Guidelines: A complete evaluation includes B-mode imaging, spectral Doppler, color Doppler, and power Doppler as needed of all accessible portions of each vessel. Bilateral testing is considered an integral part of a complete examination. Limited examinations for reoccurring indications may be performed as noted. The reflux portion of the exam is performed with the patient in reverse Trendelenburg.  +---------+---------------+---------+-----------+----------+--------------+ RIGHT    CompressibilityPhasicitySpontaneityPropertiesThrombus Aging +---------+---------------+---------+-----------+----------+--------------+ CFV      Full            Yes      Yes                                 +---------+---------------+---------+-----------+----------+--------------+ SFJ      Full                                                        +---------+---------------+---------+-----------+----------+--------------+ FV Prox  Full                                                        +---------+---------------+---------+-----------+----------+--------------+ FV Mid   Full                                                        +---------+---------------+---------+-----------+----------+--------------+ FV DistalFull                                                        +---------+---------------+---------+-----------+----------+--------------+ PFV      Full                                                        +---------+---------------+---------+-----------+----------+--------------+ POP      Full           Yes      Yes                                 +---------+---------------+---------+-----------+----------+--------------+  PTV      Full                                                        +---------+---------------+---------+-----------+----------+--------------+ PERO     Full                                                        +---------+---------------+---------+-----------+----------+--------------+   +---------+---------------+---------+-----------+----------+--------------+ LEFT     CompressibilityPhasicitySpontaneityPropertiesThrombus Aging +---------+---------------+---------+-----------+----------+--------------+ CFV      Full           Yes      Yes                                 +---------+---------------+---------+-----------+----------+--------------+ SFJ      Full                                                        +---------+---------------+---------+-----------+----------+--------------+ FV Prox  Full                                                         +---------+---------------+---------+-----------+----------+--------------+ FV Mid   Full                                                        +---------+---------------+---------+-----------+----------+--------------+ FV DistalFull                                                        +---------+---------------+---------+-----------+----------+--------------+ PFV      Full                                                        +---------+---------------+---------+-----------+----------+--------------+ POP      Full           Yes      Yes                                 +---------+---------------+---------+-----------+----------+--------------+ PTV      Full                                                        +---------+---------------+---------+-----------+----------+--------------+  PERO     Full                                                        +---------+---------------+---------+-----------+----------+--------------+     Summary: RIGHT: - There is no evidence of deep vein thrombosis in the lower extremity.  LEFT: - There is no evidence of deep vein thrombosis in the lower extremity.  *See table(s) above for measurements and observations. Electronically signed by Gretta Beganodd Early MD on 03/19/2020 at 5:03:52 PM.    Final

## 2020-03-21 NOTE — Plan of Care (Signed)
  Problem: Education: Goal: Knowledge of risk factors and measures for prevention of condition will improve Outcome: Progressing   Problem: Coping: Goal: Psychosocial and spiritual needs will be supported Outcome: Progressing   Problem: Education: Goal: Knowledge of General Education information will improve Description: Including pain rating scale, medication(s)/side effects and non-pharmacologic comfort measures Outcome: Progressing   

## 2020-03-22 LAB — CBC WITH DIFFERENTIAL/PLATELET
Abs Immature Granulocytes: 0.19 10*3/uL — ABNORMAL HIGH (ref 0.00–0.07)
Basophils Absolute: 0 10*3/uL (ref 0.0–0.1)
Basophils Relative: 0 %
Eosinophils Absolute: 0 10*3/uL (ref 0.0–0.5)
Eosinophils Relative: 0 %
HCT: 37.1 % — ABNORMAL LOW (ref 39.0–52.0)
Hemoglobin: 12.2 g/dL — ABNORMAL LOW (ref 13.0–17.0)
Immature Granulocytes: 2 %
Lymphocytes Relative: 3 %
Lymphs Abs: 0.3 10*3/uL — ABNORMAL LOW (ref 0.7–4.0)
MCH: 29.8 pg (ref 26.0–34.0)
MCHC: 32.9 g/dL (ref 30.0–36.0)
MCV: 90.5 fL (ref 80.0–100.0)
Monocytes Absolute: 0.8 10*3/uL (ref 0.1–1.0)
Monocytes Relative: 7 %
Neutro Abs: 9.8 10*3/uL — ABNORMAL HIGH (ref 1.7–7.7)
Neutrophils Relative %: 88 %
Platelets: 200 10*3/uL (ref 150–400)
RBC: 4.1 MIL/uL — ABNORMAL LOW (ref 4.22–5.81)
RDW: 12.6 % (ref 11.5–15.5)
WBC: 11.2 10*3/uL — ABNORMAL HIGH (ref 4.0–10.5)
nRBC: 0 % (ref 0.0–0.2)

## 2020-03-22 LAB — CULTURE, BLOOD (ROUTINE X 2)
Culture: NO GROWTH
Culture: NO GROWTH
Special Requests: ADEQUATE
Special Requests: ADEQUATE

## 2020-03-22 LAB — COMPREHENSIVE METABOLIC PANEL
ALT: 74 U/L — ABNORMAL HIGH (ref 0–44)
AST: 40 U/L (ref 15–41)
Albumin: 2.4 g/dL — ABNORMAL LOW (ref 3.5–5.0)
Alkaline Phosphatase: 52 U/L (ref 38–126)
Anion gap: 8 (ref 5–15)
BUN: 25 mg/dL — ABNORMAL HIGH (ref 8–23)
CO2: 28 mmol/L (ref 22–32)
Calcium: 8.9 mg/dL (ref 8.9–10.3)
Chloride: 102 mmol/L (ref 98–111)
Creatinine, Ser: 1 mg/dL (ref 0.61–1.24)
GFR calc Af Amer: >60 mL/min (ref >60)
GFR calc non Af Amer: >60 mL/min (ref >60)
Glucose, Bld: 173 mg/dL — ABNORMAL HIGH (ref 70–99)
Potassium: 5.7 mmol/L — ABNORMAL HIGH (ref 3.5–5.1)
Sodium: 138 mmol/L (ref 135–145)
Total Bilirubin: 1.1 mg/dL (ref 0.3–1.2)
Total Protein: 5 g/dL — ABNORMAL LOW (ref 6.5–8.1)

## 2020-03-22 LAB — D-DIMER, QUANTITATIVE: D-Dimer, Quant: 3.23 ug/mL-FEU — ABNORMAL HIGH (ref 0.00–0.50)

## 2020-03-22 LAB — C-REACTIVE PROTEIN: CRP: 1.4 mg/dL — ABNORMAL HIGH (ref <1.0)

## 2020-03-22 LAB — FERRITIN: Ferritin: 997 ng/mL — ABNORMAL HIGH (ref 24–336)

## 2020-03-22 LAB — MAGNESIUM: Magnesium: 2.4 mg/dL (ref 1.7–2.4)

## 2020-03-22 MED ORDER — SODIUM ZIRCONIUM CYCLOSILICATE 10 G PO PACK
10.0000 g | PACK | Freq: Two times a day (BID) | ORAL | Status: DC
  Administered 2020-03-22 – 2020-03-25 (×7): 10 g via ORAL
  Filled 2020-03-22 (×7): qty 1

## 2020-03-22 NOTE — Progress Notes (Signed)
PT Cancellation Note  Patient Details Name: Michael Spears MRN: [id number] DOB: 1955-08-07   Cancelled Treatment:    Reason Eval/Treat Not Completed: Patient at procedure or test/unavailable  Patient currently working with OT.   Jerolyn Center, PT Pager [phone]   Zena Amos 03/22/2020, 1:02 PM

## 2020-03-22 NOTE — Evaluation (Signed)
Physical Therapy Evaluation Patient Details Name: Michael Spears MRN: [id number] DOB: [birth year] Today's Date: 03/22/2020   History of Present Illness  64 y.o. male with past medical history of hypertension, hyperlipidemia, history of tobacco abuse, hyper myalgia, history of coronary artery disease status post CABG, history of pericardial effusion presented to hospital with complaints of shortness of breath for 1 week.  Patient had a Covid test done on 8/13 in [facility] which was positive. Came to ED 03/17/20 due to dyspnea, hypotension  Clinical Impression   Pt admitted with above diagnosis. 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 He currently is very limited in his mobility due to decr pulmonary function requiring 15L HFNC with sats dropping into the 70s with standing. He is eager to learn and wants things to work on in his room. Provided with theraband and brief exercise program. Pt currently with functional limitations due to the deficits listed below (see PT Problem List). Pt will benefit from skilled PT to increase their independence and safety with mobility to allow discharge to the venue listed below.       Follow Up Recommendations Home health PT;Supervision - Intermittent    Equipment Recommendations  Rolling walker with 5" wheels    Recommendations for Other Services       Precautions / Restrictions Precautions Precautions: Fall      Mobility  Bed Mobility Overal bed mobility: Modified Independent                Transfers Overall transfer level: Modified independent               General transfer comment: Per patient, has been transferring bed <> recliner <> BSC mod I   Ambulation/Gait             General Gait Details: Patient recently worked on just standing tolerance with OT and did not have the endurance to work on walking at this time. He is eager to walk, however does not want to push himself to feeling  like he can't breathe  Stairs            Wheelchair Mobility    Modified Rankin (Stroke Patients Only)       Balance Overall balance assessment: Mild deficits observed, not formally tested                                           Pertinent Vitals/Pain Pain Assessment: Faces Faces Pain Scale: Hurts a little bit Pain Location: chest with coughing     Home Living Family/patient expects to be discharged to:: Private residence Living Arrangements: Children Available Help at Discharge: Family Type of Home: House Home Access: Level entry     Home Layout: One level Home Equipment: Grab bars - tub/shower Additional Comments: Lives with adult son with disabilities     Prior Function Level of Independence: Independent         Comments: 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      Hand Dominance   Dominant Hand: Right    Extremity/Trunk Assessment   Upper Extremity Assessment Upper Extremity Assessment: Defer to OT evaluation    Lower Extremity Assessment Lower Extremity Assessment: Generalized weakness    Cervical / Trunk Assessment Cervical / Trunk Assessment: Normal  Communication   Communication: No difficulties  Cognition Arousal/Alertness: Awake/alert Behavior During Therapy:  WFL for tasks assessed/performed Overall Cognitive Status: Within Functional Limits for tasks assessed                                        General Comments General comments (skin integrity, edema, etc.): Patient further educated on use of flutter valve before using the IS to better achieve clearing of lungs. Educated on benefits of prone positioning (he reports he has not tried this, and did not feel up to it at this time--very frustrated by all the lines/equipment)    Exercises Other Exercises Other Exercises: Educated pt on general exercise program to work on maintaining/gaining strength while hospitalized. Provided orange  theraband for UE exercises. Educated on seated LAQ, marching, hip abdct, PNF1 pattern, elbow flexion, elbow extension and horizontal abdct; supine exercises: SLR, hip abduction   Assessment/Plan    PT Assessment Patient needs continued PT services  PT Problem List Decreased strength;Decreased activity tolerance;Decreased balance;Decreased mobility;Decreased knowledge of use of DME;Cardiopulmonary status limiting activity       PT Treatment Interventions DME instruction;Gait training;Functional mobility training;Therapeutic activities;Therapeutic exercise;Balance training;Patient/family education    PT Goals (Current goals can be found in the Care Plan section)  Acute Rehab PT Goals Patient Stated Goal: to breathe better  PT Goal Formulation: With patient Time For Goal Achievement: 04/05/20 Potential to Achieve Goals: Good    Frequency Min 3X/week   Barriers to discharge Decreased caregiver support      Co-evaluation               AM-PAC PT "6 Clicks" Mobility  Outcome Measure Help needed turning from your back to your side while in a flat bed without using bedrails?: None Help needed moving from lying on your back to sitting on the side of a flat bed without using bedrails?: None Help needed moving to and from a bed to a chair (including a wheelchair)?: None Help needed standing up from a chair using your arms (e.g., wheelchair or bedside chair)?: None Help needed to walk in hospital room?: Total Help needed climbing 3-5 steps with a railing? : Total 6 Click Score: 18    End of Session Equipment Utilized During Treatment: Oxygen Activity Tolerance: Patient limited by fatigue;Treatment limited secondary to medical complications (Comment) (dyspnea on exertion) Patient left: in bed;with call bell/phone within reach;with nursing/sitter in room   PT Visit Diagnosis: Difficulty in walking, not elsewhere classified (R26.2);Muscle weakness (generalized) (M62.81)    Time:  1324-4010 PT Time Calculation (min) (ACUTE ONLY): 25 min   Charges:   PT Evaluation $PT Eval Low Complexity: 1 Low PT Treatments $Therapeutic Exercise: 8-22 mins         Jerolyn Center, PT Pager [phone]   Zena Amos 03/22/2020, 3:45 PM

## 2020-03-22 NOTE — Progress Notes (Signed)
Occupational Therapy Treatment Patient Details Name: Michael Spears MRN: [id number] DOB: 09/01/1955 Today's Date: 03/22/2020    History of present illness 64 y.o. male with past medical history of hypertension, hyperlipidemia, history of tobacco abuse, hyper myalgia, history of coronary artery disease status post CABG, history of pericardial effusion presented to hospital with complaints of shortness of breath for 1 week.  Patient had a Covid test done on 8/13 in [place] [facility] which was positive. Came to ED 03/17/20 due to dyspnea, hypotension   OT comments  Pt admitted with above. He demonstrates the below listed deficits and will benefit from continued OT to maximize safety and independence with BADLs.  Pt presents to OT with generalized weakness, decreased activity tolerance.  He is able to perform ADLs with min A with multiple long seated rest breaks, and is able to perform functional transfers mod I, but desats.  Focused on identifying his activity tolerance so he could work within this range to avoid over fatigue and maintain good breath control - see details below.  He is eager to improve.  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  Anticipate good progress as his breathing improves and 02 requirements decrease.  Will follow.     Follow Up Recommendations  No OT follow up;Supervision - Intermittent    Equipment Recommendations  Tub/shower seat    Recommendations for Other Services      Precautions / Restrictions Precautions Precautions: Fall       Mobility Bed Mobility Overal bed mobility: Modified Independent                Transfers Overall transfer level: Modified independent               General transfer comment: has been transferring bed <> recliner <> BSC mod I     Balance Overall balance assessment: Mild deficits observed, not formally tested                                          ADL either performed or assessed with clinical judgement   ADL Overall ADL's : Needs assistance/impaired Eating/Feeding: Independent   Grooming: Wash/dry hands;Wash/dry face;Oral care;Brushing hair;Set up;Sitting   Upper Body Bathing: Minimal assistance;Sitting Upper Body Bathing Details (indicate cue type and reason): due to endurance  Lower Body Bathing: Minimal assistance;Sit to/from stand Lower Body Bathing Details (indicate cue type and reason): due to endurance  Upper Body Dressing : Set up;Sitting   Lower Body Dressing: Supervision/safety;Sit to/from stand Lower Body Dressing Details (indicate cue type and reason): with multiple rest breaks  Toilet Transfer: Modified Independent;Ambulation;BSC Toilet Transfer Details (indicate cue type and reason): ambulate 4'  Toileting- Clothing Manipulation and Hygiene: Modified independent;Sit to/from stand       Functional mobility during ADLs: Supervision/safety General ADL Comments: Pt desats into low 80s with minimal activity with DOE 3/4-4/4      Vision Baseline Vision/History: Wears glasses Wears Glasses: At all times Patient Visual Report: No change from baseline     Perception     Praxis      Cognition Arousal/Alertness: Awake/alert Behavior During Therapy: WFL for tasks assessed/performed Overall Cognitive Status: Within Functional Limits for tasks assessed  Exercises Exercises: Other exercises Other Exercises Other Exercises: Pt frustrated with situation. - encouragement provided.  pt initially desaturated to 83% on 15L hiflo 02 with conversation, but after working self pacing and monitoring, was able to hold conversation with DOE 2/4 and sats in the low 90s.  worked with pt on determining his activity tolerance so that he can engage in short, but frequent bouts of activity to improve endurance and tolerance.  He initially was only able to tolerate  35 seconds of static standing before sats dropped to 79%; however, with controlled, focused pursed lip breathing, he was able to extend that time to 1 mins 45 seconds.     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    Frequency  Min 2X/week        Progress Toward Goals  OT Goals(current goals can now be found in the care plan section)     Acute Rehab OT Goals Patient Stated Goal: to breathe better  OT Goal Formulation: With patient Time For Goal Achievement: 04/05/20 Potential to Achieve Goals: Good ADL Goals Pt Will Perform Grooming: with modified independence;standing Pt Will Perform Upper Body Bathing: with modified independence;sitting Pt Will Perform Lower Body Bathing: with modified independence;sit to/from stand Pt Will Perform Upper Body Dressing: with modified independence;sitting Pt Will Perform Lower Body Dressing: with modified independence;sit to/from stand Pt Will Transfer to Toilet: with modified independence;ambulating;regular height toilet;grab bars Pt Will Perform Toileting - Clothing Manipulation and hygiene: with modified independence;sit to/from stand Additional ADL Goal #1: Pt will be independent with energy conservation techniques during ADLs Additional ADL Goal #2: Pt will be independent with HEP for  strengthening and breathing  Plan      Co-evaluation                 AM-PAC OT "6 Clicks" Daily Activity     Outcome Measure   Help from another person eating meals?: None Help from another person taking care of personal grooming?: A Little Help from another person toileting, which includes using toliet, bedpan, or urinal?: A Little Help from another person bathing (including washing, rinsing, drying)?: A Little Help from another person to put on and taking off regular upper body clothing?: A Little Help from another person to put on and taking off regular lower body clothing?: A Little 6 Click Score: 19    End of Session Equipment Utilized During Treatment: Oxygen  OT Visit Diagnosis: Unsteadiness on feet (R26.81)   Activity Tolerance Patient limited by fatigue;Treatment limited secondary to medical complications (Comment)   Patient Left in bed;with call bell/phone within reach   Nurse Communication Mobility status        Time: 4268-3419 OT Time Calculation (min): 59 min  Charges: OT General Charges $OT  Visit: 1 Visit OT Evaluation $OT Eval Moderate Complexity: 1 Mod OT Treatments $Therapeutic Activity: 38-52 mins  Eber Jones., OTR/L Acute Rehabilitation Services Pager [phone] Office [phone]    Jeani Hawking M 03/22/2020, 3:17 PM

## 2020-03-22 NOTE — Progress Notes (Signed)
PROGRESS NOTE                                                                                                                                                                                                             Patient Demographics:    Michael Spears, is a 64 y.o. male, DOB - [birth year], IZT:[id number]  Outpatient Primary MD for the patient is [name], MD   Admit date - 03/17/2020   LOS - 5  Chief Complaint  Patient presents with  . Shortness of Breath       Brief Narrative: Patient is a 64 y.o. male with PMHx of HTN, HLD, CAD s/p CABG, history of pericardial effusion-presented with 1 week shortness of breath-found to have acute hypoxic respiratory failure secondary to COVID-19 pneumonia.  COVID-19 vaccinated status: Unvaccinated  Significant Events: 8/13>> COVID-19 positive ([facility]) 8/16>> Admit to [facility] for hypoxia from COVID-19 pneumonia  Significant studies: 8/16>>Chest x-ray: Multifocal pneumonia 8/18>> bilateral lower extremity Doppler: No DVT   COVID-19 medications: Steroids: 8/16>> Remdesivir: 8/16>> 8/20 Baricitinib: 8/16>>  Antibiotics: Rocephin: 8/17>>8/19 Zithromax: 8/17>>8/19  Microbiology data: 8/16 >>blood culture: No growth  Procedures: None  Consults: None  DVT prophylaxis: Prophylactic Lovenox at twice daily dosing    Subjective:   Somewhat better-no longer on NRB-but still on 15 L of HFNC.  Acknowledges improvement-he is still short of breath but much better than the past few days.   Assessment  & Plan :   Acute Hypoxic Resp Failure due to Covid 19 Viral pneumonia: Slowly improving-severe hypoxemia continues-but now no longer on NRB-only on 15 L of HFNC.  He appears very comfortable-acknowledges less short of breath when he moves around.  Continue steroids and baricitinib.  Continue attempts to slowly titrate down FiO2.  No signs of volume  overload-does not require Lasix.  Fever: afebrile O2 requirements:  SpO2: 97 % O2 Flow Rate (L/min): 15 L/min   COVID-19 Labs: Recent Labs    03/20/20 0500 03/21/20 0738 03/22/20 0658  DDIMER 3.16* 3.09* 3.23*  FERRITIN 1,603* 1,134* 997*  CRP 5.1* 2.8* 1.4*    No results found for: BNP  Recent Labs  Lab 03/17/20 1545  PROCALCITON 0.56    Lab Results  Component Value Date   SARSCOV2NAA POSITIVE (A) 03/17/2020     Prone/Incentive Spirometry: encouraged patient to lie  prone for 3-4 hours at a time for a total of 16 hours a day, and to encourage incentive spirometry use 3-4/hour.  Elevated D-dimer: Hypoxia remains stable without any further worsening-lower extremity Doppler negative-on intermediate dosing of Lovenox.  Follow periodically.  If hypoxia worsens-we will plan CTA chest.  Mild hyperkalemia: Start Lokelma-repeat electrolytes tomorrow.  Transaminitis: Mild-downtrending-stable for continued follow-up.  Etiology likely secondary to COVID-19.  CAD-s/p CABG 2013: No anginal symptoms-continue aspirin/Plavix/beta-blocker/statin.    HTN: BP on the lower side today-stop amlodipine-cautiously continue with metoprolol-holding parameters placed.  ABG:    Component Value Date/Time   PHART 7.311 (L) 12/10/2011 1847   PCO2ART 43.0 12/10/2011 1847   PO2ART 83.0 12/10/2011 1847   HCO3 21.7 12/10/2011 1847   TCO2 27 12/29/2011 0112   ACIDBASEDEF 4.0 (H) 12/10/2011 1847   O2SAT 95.0 12/10/2011 1847    Vent Settings: N/A  Condition - Extremely Guarded  Family Communication  :  Sister in law -valerie-([phone]) updated on 8/21  Code Status :  Full Code  Diet :  Diet Order            Diet Heart Room service appropriate? Yes; Fluid consistency: Thin  Diet effective now                  Disposition Plan  :   Status is: Inpatient  Remains inpatient appropriate because:Inpatient level of care appropriate due to severity of illness  Dispo: The patient is  from: Home              Anticipated d/c is to: TBS              Anticipated d/c date is: > 3 days              Patient currently is not medically stable to d/c.   Barriers to discharge: Hypoxia requiring O2 supplementation  Antimicorbials  :    Anti-infectives (From admission, onward)   Start     Dose/Rate Route Frequency Ordered Stop   03/18/20 1415  azithromycin (ZITHROMAX) 500 mg in sodium chloride 0.9 % 250 mL IVPB        500 mg 250 mL/hr over 60 Minutes Intravenous Every 24 hours 03/18/20 1414 03/20/20 1717   03/18/20 1415  cefTRIAXone (ROCEPHIN) 1 g in sodium chloride 0.9 % 100 mL IVPB        1 g 200 mL/hr over 30 Minutes Intravenous Every 24 hours 03/18/20 1414 03/20/20 1435   03/18/20 1000  remdesivir 100 mg in sodium chloride 0.9 % 100 mL IVPB  Status:  Discontinued       "Followed by" Linked Group Details   100 mg 200 mL/hr over 30 Minutes Intravenous Daily 03/17/20 1833 03/17/20 1835   03/18/20 1000  remdesivir 100 mg in sodium chloride 0.9 % 100 mL IVPB       "Followed by" Linked Group Details   100 mg 200 mL/hr over 30 Minutes Intravenous Every 24 hours 03/17/20 1826 03/21/20 1030   03/17/20 1930  remdesivir 200 mg in sodium chloride 0.9% 250 mL IVPB       "Followed by" Linked Group Details   200 mg 580 mL/hr over 30 Minutes Intravenous Once 03/17/20 1826 03/17/20 2044   03/17/20 1833  remdesivir 200 mg in sodium chloride 0.9% 250 mL IVPB  Status:  Discontinued       "Followed by" Linked Group Details   200 mg 580 mL/hr over 30 Minutes Intravenous Once 03/17/20 1833 03/17/20  1835      Inpatient Medications  Scheduled Meds: . albuterol  2 puff Inhalation Q6H  . amLODipine  5 mg Oral Daily  . vitamin C  500 mg Oral Daily  . aspirin EC  81 mg Oral Daily  . atorvastatin  80 mg Oral Daily  . baricitinib  4 mg Oral Daily  . clopidogrel  75 mg Oral Daily  . enoxaparin (LOVENOX) injection  45 mg Subcutaneous Q12H  . feeding supplement (ENSURE ENLIVE)  237 mL Oral  BID BM  . methylPREDNISolone (SOLU-MEDROL) injection  60 mg Intravenous Q12H  . metoprolol tartrate  25 mg Oral BID  . pantoprazole  40 mg Oral Daily  . ranolazine  500 mg Oral BID  . sodium chloride flush  3 mL Intravenous Q12H  . zinc sulfate  220 mg Oral Daily   Continuous Infusions: . sodium chloride     PRN Meds:.sodium chloride, acetaminophen, guaiFENesin-dextromethorphan, loperamide, nitroGLYCERIN, ondansetron **OR** ondansetron (ZOFRAN) IV, sodium chloride, sodium chloride flush   Time Spent in minutes  35   See all Orders from today for further details  Jeoffrey Massed M.D on 03/22/2020 at 10:35 AM  To page go to [email or web address] - use universal password  [facility] -  Office  [phone]    Objective:   Vitals:   03/21/20 2200 03/22/20 0000 03/22/20 0400 03/22/20 0730  BP: 115/68 106/65 109/71 113/65  Pulse: 62 61 63 (!) 56  Resp: 19 (!) Temp:  97.7 F (36.5 C) 97.9 F (36.6 C) 97.6 F (36.4 C)  TempSrc:  Oral Oral Oral  SpO2: 96% 97% 96% 97%  Weight:      Height:        Wt Readings from Last 3 Encounters:  03/17/20 88.5 kg  10/10/19 90 kg  03/28/19 87.5 kg     Intake/Output Summary (Last 24 hours) at 03/22/2020 1035 Last data filed at 03/22/2020 0920 Gross per 24 hour  Intake 3 ml  Output 600 ml  Net -597 ml     Physical Exam Gen Exam:Alert awake-not in any distress HEENT:atraumatic, normocephalic Chest: B/L clear to auscultation anteriorly CVS:S1S2 regular Abdomen:soft non tender, non distended Extremities:no edema Neurology: Non focal Skin: no rash   Data Review:    CBC Recent Labs  Lab 03/18/20 0408 03/19/20 0327 03/20/20 0500 03/21/20 0738 03/22/20 0658  WBC 7.5 10.9* 12.5* 10.6* 11.2*  HGB 13.6 12.5* 11.6* 12.2* 12.2*  HCT 40.2 36.8* 35.7* 38.0* 37.1*  PLT 177 193 212 208 200  MCV 88.4 88.2 89.9 91.1 90.5  MCH 29.9 30.0 29.2 29.3 29.8  MCHC 33.8 34.0 32.5 32.1 32.9  RDW 12.8 13.0 13.0 12.8 12.6    LYMPHSABS 0.3* 0.7 0.5* 0.3* 0.3*  MONOABS 0.1 0.5 0.6 0.6 0.8  EOSABS 0.0 0.0 0.0 0.0 0.0  BASOSABS 0.0 0.0 0.0 0.0 0.0    Chemistries  Recent Labs  Lab 03/18/20 0408 03/19/20 0327 03/20/20 0500 03/21/20 0738 03/22/20 0658  NA 135 136 140 136 138  K 3.8 4.2 4.5 4.9 5.7*  CL 100 103 104 104 102  CO2 GLUCOSE 160* 159* 160* 169* 173*  BUN 26* 25* 26* 25* 25*  CREATININE 1.05 0.93 1.01 1.02 1.00  CALCIUM 8.7* 8.5* 8.8* 8.6* 8.9  MG 2.3 2.6* 2.5* 2.5* 2.4  AST 69* 57* 52* 46* 40  ALT 59* 59* 65* 72* 74*  ALKPHOS 63 56 50 55 52  BILITOT 1.0 1.0 0.9  1.0 1.1   ------------------------------------------------------------------------------------------------------------------ No results for input(s): CHOL, HDL, LDLCALC, TRIG, CHOLHDL, LDLDIRECT in the last 72 hours.  Lab Results  Component Value Date   HGBA1C 5.9 (H) 12/06/2011   ------------------------------------------------------------------------------------------------------------------ No results for input(s): TSH, T4TOTAL, T3FREE, THYROIDAB in the last 72 hours.  Invalid input(s): FREET3 ------------------------------------------------------------------------------------------------------------------ Recent Labs    03/21/20 0738 03/22/20 0658  FERRITIN 1,134* 997*    Coagulation profile No results for input(s): INR, PROTIME in the last 168 hours.  Recent Labs    03/21/20 0738 03/22/20 0658  DDIMER 3.09* 3.23*    Cardiac Enzymes No results for input(s): CKMB, TROPONINI, MYOGLOBIN in the last 168 hours.  Invalid input(s): CK ------------------------------------------------------------------------------------------------------------------ No results found for: BNP  Micro Results Recent Results (from the past 240 hour(s))  Blood Culture (routine x 2)     Status: None (Preliminary result)   Collection Time: 03/17/20  4:20 PM   Specimen: BLOOD RIGHT ARM  Result Value Ref Range Status    Specimen Description BLOOD RIGHT ARM  Final   Special Requests   Final    BOTTLES DRAWN AEROBIC AND ANAEROBIC Blood Culture adequate volume   Culture   Final    NO GROWTH 4 DAYS Performed at [facility], [address], [place], [place] [address]    Report Status PENDING  Incomplete  Blood Culture (routine x 2)     Status: None (Preliminary result)   Collection Time: 03/17/20  4:50 PM   Specimen: BLOOD RIGHT HAND  Result Value Ref Range Status   Specimen Description BLOOD RIGHT HAND  Final   Special Requests   Final    BOTTLES DRAWN AEROBIC AND ANAEROBIC Blood Culture adequate volume   Culture   Final    NO GROWTH 4 DAYS Performed at [facility], [address], [place], [place] [address]    Report Status PENDING  Incomplete  SARS Coronavirus 2 by RT PCR (hospital order, performed in [facility] hospital lab) Nasopharyngeal Nasopharyngeal Swab     Status: Abnormal   Collection Time: 03/17/20  5:20 PM   Specimen: Nasopharyngeal Swab  Result Value Ref Range Status   SARS Coronavirus 2 POSITIVE (A) NEGATIVE Final    Comment: RESULT CALLED TO, READ BACK BY AND VERIFIED WITH: J,SAWATZKI  03/17/20 EB (NOTE) SARS-CoV-2 target nucleic acids are DETECTED  SARS-CoV-2 RNA is generally detectable in upper respiratory specimens  during the acute phase of infection.  Positive results are indicative  of the presence of the identified virus, but do not rule out bacterial infection or co-infection with other pathogens not detected by the test.  Clinical correlation with patient history and  other diagnostic information is necessary to determine patient infection status.  The expected result is negative.  Fact Sheet for Patients:   BoilerBrush.com.cy   Fact Sheet for Healthcare Providers:   https://pope.com/    This test is not yet approved or cleared by the [place] FDA and  has been authorized for detection and/or  diagnosis of SARS-CoV-2 by FDA under an Emergency Use Authorization (EUA).  This EUA will remain in effect (meaning this test can  be used) for the duration of  the COVID-19 declaration under Section 564(b)(1) of the Act, 21 U.S.C. section 360-bbb-3(b)(1), unless the authorization is terminated or revoked sooner.  Performed at [facility], [address], [place], [place] [address]     Radiology Reports DG Chest [place] 1 View  Result Date: 03/17/2020 CLINICAL DATA:  64 year old male with shortness of breath. Positive COVID-19. EXAM: PORTABLE CHEST 1 VIEW COMPARISON:  Chest radiograph dated 02/21/2012. FINDINGS: Bilateral mid to lower lung field streaky densities most consistent with multifocal pneumonia and in keeping with provided history of COVID-19. Clinical correlation and follow-up recommended. No pleural effusion pneumothorax. The cardiac silhouette is within limits. Median sternotomy wires and CABG vascular clips. No acute osseous pathology. IMPRESSION: Multifocal pneumonia. Clinical correlation and follow-up recommended. Electronically Signed   By: Elgie Collard M.D.   On: 03/17/2020 16:03   VAS [place] LOWER EXTREMITY VENOUS (DVT)  Result Date: 03/19/2020  Lower Venous DVTStudy Indications: Covid positive with elevated d-dimer.  Comparison Study: No prior. Performing Technologist: Marilynne Halsted RDMS, RVT  Examination Guidelines: A complete evaluation includes B-mode imaging, spectral Doppler, color Doppler, and power Doppler as needed of all accessible portions of each vessel. Bilateral testing is considered an integral part of a complete examination. Limited examinations for reoccurring indications may be performed as noted. The reflux portion of the exam is performed with the patient in reverse Trendelenburg.  +---------+---------------+---------+-----------+----------+--------------+ RIGHT    CompressibilityPhasicitySpontaneityPropertiesThrombus Aging  +---------+---------------+---------+-----------+----------+--------------+ CFV      Full           Yes      Yes                                 +---------+---------------+---------+-----------+----------+--------------+ SFJ      Full                                                        +---------+---------------+---------+-----------+----------+--------------+ FV Prox  Full                                                        +---------+---------------+---------+-----------+----------+--------------+ FV Mid   Full                                                        +---------+---------------+---------+-----------+----------+--------------+ FV DistalFull                                                        +---------+---------------+---------+-----------+----------+--------------+ PFV      Full                                                        +---------+---------------+---------+-----------+----------+--------------+ POP      Full           Yes      Yes                                 +---------+---------------+---------+-----------+----------+--------------+  PTV      Full                                                        +---------+---------------+---------+-----------+----------+--------------+ PERO     Full                                                        +---------+---------------+---------+-----------+----------+--------------+   +---------+---------------+---------+-----------+----------+--------------+ LEFT     CompressibilityPhasicitySpontaneityPropertiesThrombus Aging +---------+---------------+---------+-----------+----------+--------------+ CFV      Full           Yes      Yes                                 +---------+---------------+---------+-----------+----------+--------------+ SFJ      Full                                                         +---------+---------------+---------+-----------+----------+--------------+ FV Prox  Full                                                        +---------+---------------+---------+-----------+----------+--------------+ FV Mid   Full                                                        +---------+---------------+---------+-----------+----------+--------------+ FV DistalFull                                                        +---------+---------------+---------+-----------+----------+--------------+ PFV      Full                                                        +---------+---------------+---------+-----------+----------+--------------+ POP      Full           Yes      Yes                                 +---------+---------------+---------+-----------+----------+--------------+ PTV      Full                                                        +---------+---------------+---------+-----------+----------+--------------+   PERO     Full                                                        +---------+---------------+---------+-----------+----------+--------------+     Summary: RIGHT: - There is no evidence of deep vein thrombosis in the lower extremity.  LEFT: - There is no evidence of deep vein thrombosis in the lower extremity.  *See table(s) above for measurements and observations. Electronically signed by Gretta Beganodd Early MD on 03/19/2020 at 5:03:52 PM.    Final

## 2020-03-23 LAB — CBC
HCT: 36.5 % — ABNORMAL LOW (ref 39.0–52.0)
Hemoglobin: 12 g/dL — ABNORMAL LOW (ref 13.0–17.0)
MCH: 29.3 pg (ref 26.0–34.0)
MCHC: 32.9 g/dL (ref 30.0–36.0)
MCV: 89 fL (ref 80.0–100.0)
Platelets: 212 10*3/uL (ref 150–400)
RBC: 4.1 MIL/uL — ABNORMAL LOW (ref 4.22–5.81)
RDW: 12.3 % (ref 11.5–15.5)
WBC: 13.3 10*3/uL — ABNORMAL HIGH (ref 4.0–10.5)
nRBC: 0 % (ref 0.0–0.2)

## 2020-03-23 LAB — COMPREHENSIVE METABOLIC PANEL
ALT: 66 U/L — ABNORMAL HIGH (ref 0–44)
AST: 28 U/L (ref 15–41)
Albumin: 2.4 g/dL — ABNORMAL LOW (ref 3.5–5.0)
Alkaline Phosphatase: 58 U/L (ref 38–126)
Anion gap: 7 (ref 5–15)
BUN: 21 mg/dL (ref 8–23)
CO2: 29 mmol/L (ref 22–32)
Calcium: 8.8 mg/dL — ABNORMAL LOW (ref 8.9–10.3)
Chloride: 100 mmol/L (ref 98–111)
Creatinine, Ser: 1.02 mg/dL (ref 0.61–1.24)
GFR calc Af Amer: >60 mL/min (ref >60)
GFR calc non Af Amer: >60 mL/min (ref >60)
Glucose, Bld: 191 mg/dL — ABNORMAL HIGH (ref 70–99)
Potassium: 5.2 mmol/L — ABNORMAL HIGH (ref 3.5–5.1)
Sodium: 136 mmol/L (ref 135–145)
Total Bilirubin: 0.8 mg/dL (ref 0.3–1.2)
Total Protein: 4.8 g/dL — ABNORMAL LOW (ref 6.5–8.1)

## 2020-03-23 LAB — C-REACTIVE PROTEIN: CRP: 0.8 mg/dL (ref <1.0)

## 2020-03-23 LAB — D-DIMER, QUANTITATIVE: D-Dimer, Quant: 3.17 ug/mL-FEU — ABNORMAL HIGH (ref 0.00–0.50)

## 2020-03-23 MED ORDER — ENOXAPARIN SODIUM 60 MG/0.6ML ~~LOC~~ SOLN
45.0000 mg | SUBCUTANEOUS | Status: DC
  Administered 2020-03-24 – 2020-03-25 (×2): 45 mg via SUBCUTANEOUS
  Filled 2020-03-23 (×2): qty 0.6

## 2020-03-23 NOTE — Progress Notes (Signed)
PROGRESS NOTE                                                                                                                                                                                                             Patient Demographics:    Michael Spears, is a 64 y.o. male, DOB - 04-29-1956, EXB:[id number]  Outpatient Primary MD for the patient is [name], MD   Admit date - 03/17/2020   LOS - 6  Chief Complaint  Patient presents with  . Shortness of Breath       Brief Narrative: Patient is a 64 y.o. male with PMHx of HTN, HLD, CAD s/p CABG, history of pericardial effusion-presented with 1 week shortness of breath-found to have acute hypoxic respiratory failure secondary to COVID-19 pneumonia.  COVID-19 vaccinated status: Unvaccinated  Significant Events: 8/13>> COVID-19 positive ([facility]) 8/16>> Admit to [facility] for hypoxia from COVID-19 pneumonia  Significant studies: 8/16>>Chest x-ray: Multifocal pneumonia 8/18>> bilateral lower extremity Doppler: No DVT   COVID-19 medications: Steroids: 8/16>> Remdesivir: 8/16>> 8/20 Baricitinib: 8/16>>  Antibiotics: Rocephin: 8/17>>8/19 Zithromax: 8/17>>8/19  Microbiology data: 8/16 >>blood culture: No growth  Procedures: None  Consults: None  DVT prophylaxis: Prophylactic Lovenox at twice daily dosing>> change to daily dosing on 8/22    Subjective:   Feels better-Down to around 8-10 L of oxygen.  Denies any chest pain-no shortness of breath at rest.   Assessment  & Plan :   Acute Hypoxic Resp Failure due to Covid 19 Viral pneumonia: Improving-Down to 8-10 L of oxygen-at one point he was on heated high flow and NRB.  Continue steroids and baricitinib.  Continue to attempt to slowly titrate down FiO2.   Fever: afebrile O2 requirements:  SpO2: 97 % O2 Flow Rate (L/min): 15 L/min   COVID-19 Labs: Recent Labs    03/21/20 0738  03/22/20 0658 03/23/20 0300  DDIMER 3.09* 3.23* 3.17*  FERRITIN 1,134* 997*  --   CRP 2.8* 1.4* 0.8    No results found for: BNP  Recent Labs  Lab 03/17/20 1545  PROCALCITON 0.56    Lab Results  Component Value Date   SARSCOV2NAA POSITIVE (A) 03/17/2020     Prone/Incentive Spirometry: encouraged patient to lie prone for 3-4 hours at a time for a total of 16 hours a day, and to encourage incentive  spirometry use 3-4/hour.  Elevated D-dimer: Hypoxia improving-lower extremity Doppler negative-do not think patient requires any further imaging at this point.  Remains on prophylactic Lovenox.  Mild hyperkalemia: Very mild-but better-continue Lokelma-repeat electrolytes tomorrow   Transaminitis: Mild-downtrending-stable for continued follow-up.  Etiology likely secondary to COVID-19.  CAD-s/p CABG 2013: No anginal symptoms-continue aspirin/Plavix/beta-blocker/statin.    HTN: BP stable-continue metoprolol-amlodipine on hold.    ABG:    Component Value Date/Time   PHART 7.311 (L) 12/10/2011 1847   PCO2ART 43.0 12/10/2011 1847   PO2ART 83.0 12/10/2011 1847   HCO3 21.7 12/10/2011 1847   TCO2 27 12/29/2011 0112   ACIDBASEDEF 4.0 (H) 12/10/2011 1847   O2SAT 95.0 12/10/2011 1847    Vent Settings: N/A  Condition - Extremely Guarded  Family Communication  :  Sister in law -valerie-[phone]) updated on 8/21  However patient on 8/22 expressed that he does not want me to update his sister-in-law (had given consent a few days ago to talk to his sister-in-law Vikki Ports) or any family members-as he has been getting numerous phone calls.  Hence we will not call family unless patient gives me permission.  Code Status :  Full Code  Diet :  Diet Order            Diet Heart Room service appropriate? Yes; Fluid consistency: Thin  Diet effective now                  Disposition Plan  :   Status is: Inpatient  Remains inpatient appropriate because:Inpatient level of care  appropriate due to severity of illness  Dispo: The patient is from: Home              Anticipated d/c is to: [facility]BS              Anticipated d/c date is: > 3 days              Patient currently is not medically stable to d/c.   Barriers to discharge: Hypoxia requiring O2 supplementation  Antimicorbials  :    Anti-infectives (From admission, onward)   Start     Dose/Rate Route Frequency Ordered Stop   03/18/20 1415  azithromycin (ZITHROMAX) 500 mg in sodium chloride 0.9 % 250 mL IVPB        500 mg 250 mL/hr over 60 Minutes Intravenous Every 24 hours 03/18/20 1414 03/20/20 1717   03/18/20 1415  cefTRIAXone (ROCEPHIN) 1 g in sodium chloride 0.9 % 100 mL IVPB        1 g 200 mL/hr over 30 Minutes Intravenous Every 24 hours 03/18/20 1414 03/20/20 1435   03/18/20 1000  remdesivir 100 mg in sodium chloride 0.9 % 100 mL IVPB  Status:  Discontinued       "Followed by" Linked Group Details   100 mg 200 mL/hr over 30 Minutes Intravenous Daily 03/17/20 1833 03/17/20 1835   03/18/20 1000  remdesivir 100 mg in sodium chloride 0.9 % 100 mL IVPB       "Followed by" Linked Group Details   100 mg 200 mL/hr over 30 Minutes Intravenous Every 24 hours 03/17/20 1826 03/21/20 1030   03/17/20 1930  remdesivir 200 mg in sodium chloride 0.9% 250 mL IVPB       "Followed by" Linked Group Details   200 mg 580 mL/hr over 30 Minutes Intravenous Once 03/17/20 1826 03/17/20 2044   03/17/20 1833  remdesivir 200 mg in sodium chloride 0.9% 250 mL IVPB  Status:  Discontinued       "Followed by" Linked Group Details   200 mg 580 mL/hr over 30 Minutes Intravenous Once 03/17/20 1833 03/17/20 1835      Inpatient Medications  Scheduled Meds: . albuterol  2 puff Inhalation Q6H  . vitamin C  500 mg Oral Daily  . aspirin EC  81 mg Oral Daily  . atorvastatin  80 mg Oral Daily  . baricitinib  4 mg Oral Daily  . clopidogrel  75 mg Oral Daily  . enoxaparin (LOVENOX) injection  45 mg Subcutaneous Q12H  . feeding  supplement (ENSURE ENLIVE)  237 mL Oral BID BM  . methylPREDNISolone (SOLU-MEDROL) injection  60 mg Intravenous Q12H  . metoprolol tartrate  25 mg Oral BID  . pantoprazole  40 mg Oral Daily  . ranolazine  500 mg Oral BID  . sodium chloride flush  3 mL Intravenous Q12H  . sodium zirconium cyclosilicate  10 g Oral BID  . zinc sulfate  220 mg Oral Daily   Continuous Infusions: . sodium chloride     PRN Meds:.sodium chloride, acetaminophen, guaiFENesin-dextromethorphan, loperamide, nitroGLYCERIN, ondansetron **OR** ondansetron (ZOFRAN) IV, sodium chloride, sodium chloride flush   Time Spent in minutes  25   See all Orders from today for further details  Jeoffrey Massed M.D on 03/23/2020 at 2:46 PM  To page go to [email or web address] - use universal password  [facility] -  Office  [phone]    Objective:   Vitals:   03/23/20 0442 03/23/20 0850 03/23/20 0900 03/23/20 1202  BP: 118/85 125/70    Pulse: 64 60    Resp: 20 (!) 26 (!) 22   Temp: 98.2 F (36.8 C) 98.8 F (37.1 C)  97.6 F (36.4 C)  TempSrc: Oral Oral  Oral  SpO2: 96% 97%    Weight:      Height:        Wt Readings from Last 3 Encounters:  03/17/20 88.5 kg  10/10/19 90 kg  03/28/19 87.5 kg     Intake/Output Summary (Last 24 hours) at 03/23/2020 1446 Last data filed at 03/23/2020 1203 Gross per 24 hour  Intake 600 ml  Output 1650 ml  Net -1050 ml     Physical Exam Gen Exam:Alert awake-not in any distress HEENT:atraumatic, normocephalic Chest: B/L clear to auscultation anteriorly CVS:S1S2 regular Abdomen:soft non tender, non distended Extremities:no edema Neurology: Non focal Skin: no rash   Data Review:    CBC Recent Labs  Lab 03/18/20 0408 03/18/20 0408 03/19/20 0327 03/20/20 0500 03/21/20 0738 03/22/20 0658 03/23/20 0300  WBC 7.5   < > 10.9* 12.5* 10.6* 11.2* 13.3*  HGB 13.6   < > 12.5* 11.6* 12.2* 12.2* 12.0*  HCT 40.2   < > 36.8* 35.7* 38.0* 37.1* 36.5*  PLT 177   < > 193 212  208 200 212  MCV 88.4   < > 88.2 89.9 91.1 90.5 89.0  MCH 29.9   < > 30.0 29.2 29.3 29.8 29.3  MCHC 33.8   < > 34.0 32.5 32.1 32.9 32.9  RDW 12.8   < > 13.0 13.0 12.8 12.6 12.3  LYMPHSABS 0.3*  --  0.7 0.5* 0.3* 0.3*  --   MONOABS 0.1  --  0.5 0.6 0.6 0.8  --   EOSABS 0.0  --  0.0 0.0 0.0 0.0  --   BASOSABS 0.0  --  0.0 0.0 0.0 0.0  --    < > = values in this interval not displayed.  Chemistries  Recent Labs  Lab 03/18/20 0408 03/18/20 0408 03/19/20 0327 03/20/20 0500 03/21/20 0738 03/22/20 0658 03/23/20 0300  NA 135   < > 136 140 136 138 136  K 3.8   < > 4.2 4.5 4.9 5.7* 5.2*  CL 100   < > 103 104 104 102 100  CO2 22   < > 22 26 26 28 29   GLUCOSE 160*   < > 159* 160* 169* 173* 191*  BUN 26*   < > 25* 26* 25* 25* 21  CREATININE 1.05   < > 0.93 1.01 1.02 1.00 1.02  CALCIUM 8.7*   < > 8.5* 8.8* 8.6* 8.9 8.8*  MG 2.3  --  2.6* 2.5* 2.5* 2.4  --   AST 69*   < > 57* 52* 46* 40 28  ALT 59*   < > 59* 65* 72* 74* 66*  ALKPHOS 63   < > 56 50 55 52 58  BILITOT 1.0   < > 1.0 0.9 1.0 1.1 0.8   < > = values in this interval not displayed.   ------------------------------------------------------------------------------------------------------------------ No results for input(s): CHOL, HDL, LDLCALC, TRIG, CHOLHDL, LDLDIRECT in the last 72 hours.  Lab Results  Component Value Date   HGBA1C 5.9 (H) 12/06/2011   ------------------------------------------------------------------------------------------------------------------ No results for input(s): TSH, T4TOTAL, T3FREE, THYROIDAB in the last 72 hours.  Invalid input(s): FREET3 ------------------------------------------------------------------------------------------------------------------ Recent Labs    03/21/20 0738 03/22/20 0658  FERRITIN 1,134* 997*    Coagulation profile No results for input(s): INR, PROTIME in the last 168 hours.  Recent Labs    03/22/20 0658 03/23/20 0300  DDIMER 3.23* 3.17*    Cardiac  Enzymes No results for input(s): CKMB, TROPONINI, MYOGLOBIN in the last 168 hours.  Invalid input(s): CK ------------------------------------------------------------------------------------------------------------------ No results found for: BNP  Micro Results Recent Results (from the past 240 hour(s))  Blood Culture (routine x 2)     Status: None   Collection Time: 03/17/20  4:20 PM   Specimen: BLOOD RIGHT ARM  Result Value Ref Range Status   Specimen Description BLOOD RIGHT ARM  Final   Special Requests   Final    BOTTLES DRAWN AEROBIC AND ANAEROBIC Blood Culture adequate volume   Culture   Final    NO GROWTH 5 DAYS Performed at [facility], [address] [place], [place] [address]    Report Status 03/22/2020 FINAL  Final  Blood Culture (routine x 2)     Status: None   Collection Time: 03/17/20  4:50 PM   Specimen: BLOOD RIGHT HAND  Result Value Ref Range Status   Specimen Description BLOOD RIGHT HAND  Final   Special Requests   Final    BOTTLES DRAWN AEROBIC AND ANAEROBIC Blood Culture adequate volume   Culture   Final    NO GROWTH 5 DAYS Performed at [facility], [address] [place], [place] [address]    Report Status 03/22/2020 FINAL  Final  SARS Coronavirus 2 by RT PCR (hospital order, performed in [facility]) Nasopharyngeal Nasopharyngeal Swab     Status: Abnormal   Collection Time: 03/17/20  5:20 PM   Specimen: Nasopharyngeal Swab  Result Value Ref Range Status   SARS Coronavirus 2 POSITIVE (A) NEGATIVE Final    Comment: RESULT CALLED TO, READ BACK BY AND VERIFIED WITH: [email or web address] @2001  03/17/20 EB (NOTE) SARS-CoV-2 target nucleic acids are DETECTED  SARS-CoV-2 RNA is generally detectable in upper respiratory specimens  during the  acute phase of infection.  Positive results are indicative  of the presence of the identified virus, but do not rule out bacterial infection or co-infection with other pathogens not detected by the  test.  Clinical correlation with patient history and  other diagnostic information is necessary to determine patient infection status.  The expected result is negative.  Fact Sheet for Patients:   BoilerBrush.com.cy   Fact Sheet for Healthcare Providers:   https://pope.com/    This test is not yet approved or cleared by the [place] FDA and  has been authorized for detection and/or diagnosis of SARS-CoV-2 by FDA under an Emergency Use Authorization (EUA).  This EUA will remain in effect (meaning this test can  be used) for the duration of  the COVID-19 declaration under Section 564(b)(1) of the Act, 21 U.S.C. section 360-bbb-3(b)(1), unless the authorization is terminated or revoked sooner.  Performed at [facility], [address], [place], [place] [address]     Radiology Reports DG Chest Prosser 1 View  Result Date: 03/17/2020 CLINICAL DATA:  64 year old male with shortness of breath. Positive COVID-19. EXAM: PORTABLE CHEST 1 VIEW COMPARISON:  Chest radiograph dated 02/21/2012. FINDINGS: Bilateral mid to lower lung field streaky densities most consistent with multifocal pneumonia and in keeping with provided history of COVID-19. Clinical correlation and follow-up recommended. No pleural effusion pneumothorax. The cardiac silhouette is within limits. Median sternotomy wires and CABG vascular clips. No acute osseous pathology. IMPRESSION: Multifocal pneumonia. Clinical correlation and follow-up recommended. Electronically Signed   By: Elgie Collard M.D.   On: 03/17/2020 16:03   VAS [place] LOWER EXTREMITY VENOUS (DVT)  Result Date: 03/19/2020  Lower Venous DVTStudy Indications: Covid positive with elevated d-dimer.  Comparison Study: No prior. Performing Technologist: Marilynne Halsted RDMS, RVT  Examination Guidelines: A complete evaluation includes B-mode imaging, spectral Doppler, color Doppler, and power Doppler as needed of all  accessible portions of each vessel. Bilateral testing is considered an integral part of a complete examination. Limited examinations for reoccurring indications may be performed as noted. The reflux portion of the exam is performed with the patient in reverse Trendelenburg.  +---------+---------------+---------+-----------+----------+--------------+ RIGHT    CompressibilityPhasicitySpontaneityPropertiesThrombus Aging +---------+---------------+---------+-----------+----------+--------------+ CFV      Full           Yes      Yes                                 +---------+---------------+---------+-----------+----------+--------------+ SFJ      Full                                                        +---------+---------------+---------+-----------+----------+--------------+ FV Prox  Full                                                        +---------+---------------+---------+-----------+----------+--------------+ FV Mid   Full                                                        +---------+---------------+---------+-----------+----------+--------------+  FV DistalFull                                                        +---------+---------------+---------+-----------+----------+--------------+ PFV      Full                                                        +---------+---------------+---------+-----------+----------+--------------+ POP      Full           Yes      Yes                                 +---------+---------------+---------+-----------+----------+--------------+ PTV      Full                                                        +---------+---------------+---------+-----------+----------+--------------+ PERO     Full                                                        +---------+---------------+---------+-----------+----------+--------------+   +---------+---------------+---------+-----------+----------+--------------+  LEFT     CompressibilityPhasicitySpontaneityPropertiesThrombus Aging +---------+---------------+---------+-----------+----------+--------------+ CFV      Full           Yes      Yes                                 +---------+---------------+---------+-----------+----------+--------------+ SFJ      Full                                                        +---------+---------------+---------+-----------+----------+--------------+ FV Prox  Full                                                        +---------+---------------+---------+-----------+----------+--------------+ FV Mid   Full                                                        +---------+---------------+---------+-----------+----------+--------------+ FV DistalFull                                                        +---------+---------------+---------+-----------+----------+--------------+  PFV      Full                                                        +---------+---------------+---------+-----------+----------+--------------+ POP      Full           Yes      Yes                                 +---------+---------------+---------+-----------+----------+--------------+ PTV      Full                                                        +---------+---------------+---------+-----------+----------+--------------+ PERO     Full                                                        +---------+---------------+---------+-----------+----------+--------------+     Summary: RIGHT: - There is no evidence of deep vein thrombosis in the lower extremity.  LEFT: - There is no evidence of deep vein thrombosis in the lower extremity.  *See table(s) above for measurements and observations. Electronically signed by Gretta Began MD on 03/19/2020 at 5:03:52 PM.    Final

## 2020-03-24 LAB — CBC
HCT: 37 % — ABNORMAL LOW (ref 39.0–52.0)
Hemoglobin: 12.2 g/dL — ABNORMAL LOW (ref 13.0–17.0)
MCH: 29.4 pg (ref 26.0–34.0)
MCHC: 33 g/dL (ref 30.0–36.0)
MCV: 89.2 fL (ref 80.0–100.0)
Platelets: 208 10*3/uL (ref 150–400)
RBC: 4.15 MIL/uL — ABNORMAL LOW (ref 4.22–5.81)
RDW: 12.3 % (ref 11.5–15.5)
WBC: 13.4 10*3/uL — ABNORMAL HIGH (ref 4.0–10.5)
nRBC: 0 % (ref 0.0–0.2)

## 2020-03-24 LAB — COMPREHENSIVE METABOLIC PANEL
ALT: 56 U/L — ABNORMAL HIGH (ref 0–44)
AST: 23 U/L (ref 15–41)
Albumin: 2.3 g/dL — ABNORMAL LOW (ref 3.5–5.0)
Alkaline Phosphatase: 51 U/L (ref 38–126)
Anion gap: 5 (ref 5–15)
BUN: 21 mg/dL (ref 8–23)
CO2: 31 mmol/L (ref 22–32)
Calcium: 8.7 mg/dL — ABNORMAL LOW (ref 8.9–10.3)
Chloride: 99 mmol/L (ref 98–111)
Creatinine, Ser: 0.93 mg/dL (ref 0.61–1.24)
GFR calc Af Amer: >60 mL/min (ref >60)
GFR calc non Af Amer: >60 mL/min (ref >60)
Glucose, Bld: 167 mg/dL — ABNORMAL HIGH (ref 70–99)
Potassium: 5.2 mmol/L — ABNORMAL HIGH (ref 3.5–5.1)
Sodium: 135 mmol/L (ref 135–145)
Total Bilirubin: 0.9 mg/dL (ref 0.3–1.2)
Total Protein: 4.8 g/dL — ABNORMAL LOW (ref 6.5–8.1)

## 2020-03-24 LAB — C-REACTIVE PROTEIN: CRP: 0.7 mg/dL (ref <1.0)

## 2020-03-24 LAB — D-DIMER, QUANTITATIVE: D-Dimer, Quant: 3.24 ug/mL-FEU — ABNORMAL HIGH (ref 0.00–0.50)

## 2020-03-24 MED ORDER — METHYLPREDNISOLONE SODIUM SUCC 40 MG IJ SOLR
40.0000 mg | Freq: Two times a day (BID) | INTRAMUSCULAR | Status: DC
  Administered 2020-03-24 – 2020-03-25 (×2): 40 mg via INTRAVENOUS
  Filled 2020-03-24: qty 1

## 2020-03-24 NOTE — Progress Notes (Signed)
PROGRESS NOTE                                                                                                                                                                                                             Patient Demographics:    Michael Spears, is a 64 y.o. male, DOB - [birth year], ZOX:[id number]  Outpatient Primary MD for the patient is [name], MD   Admit date - 03/17/2020   LOS - 7  Chief Complaint  Patient presents with   Shortness of Breath       Brief Narrative: Patient is a 64 y.o. male with PMHx of HTN, HLD, CAD s/p CABG, history of pericardial effusion-presented with 1 week shortness of breath-found to have acute hypoxic respiratory failure secondary to COVID-19 pneumonia.  COVID-19 vaccinated status: Unvaccinated  Significant Events: 8/13>> COVID-19 positive ([facility]) 8/16>> Admit to [facility] for hypoxia from COVID-19 pneumonia  Significant studies: 8/16>>Chest x-ray: Multifocal pneumonia 8/18>> bilateral lower extremity Doppler: No DVT   COVID-19 medications: Steroids: 8/16>> Remdesivir: 8/16>> 8/20 Baricitinib: 8/16>>  Antibiotics: Rocephin: 8/17>>8/19 Zithromax: 8/17>>8/19  Microbiology data: 8/16 >>blood culture: No growth  Procedures: None  Consults: None  DVT prophylaxis: Prophylactic Lovenox    Subjective:   Feels better-titrated down to 5-6 L today.  Inquiring about discharge.  Does not want me to call any family members.   Assessment  & Plan :   Acute Hypoxic Resp Failure due to Covid 19 Viral pneumonia: Improved-had severe hypoxia just a few days ago requiring heated high flow and NRB.  Continue to attempt to slowly titrate down FiO2-continue to titrate down steroids.  Remains on baricitinib.  Suspect that if clinical improvement continues-he will probably go home in the next few days.  Will most likely require home O2.   Fever: afebrile O2  requirements:  SpO2: 92 % O2 Flow Rate (L/min): 5 L/min   COVID-19 Labs: Recent Labs    03/22/20 0658 03/23/20 0300 03/24/20 0319  DDIMER 3.23* 3.17* 3.24*  FERRITIN 997*  --   --   CRP 1.4* 0.8 0.7    No results found for: BNP  Recent Labs  Lab 03/17/20 1545  PROCALCITON 0.56    Lab Results  Component Value Date   SARSCOV2NAA POSITIVE (A) 03/17/2020     Prone/Incentive Spirometry: encouraged patient to lie  prone for 3-4 hours at a time for a total of 16 hours a day, and to encourage incentive spirometry use 3-4/hour.  Elevated D-dimer: Hypoxia improving-lower extremity Doppler negative-do not think patient requires any further imaging at this point.  Remains on prophylactic Lovenox.  Mild hyperkalemia: Continues to have mild hyperkalemia-continue Lokelma.  Transaminitis: Mild-downtrending-stable for continued follow-up.  Etiology likely secondary to COVID-19.  CAD-s/p CABG 2013: No anginal symptoms-continue aspirin/Plavix/beta-blocker/statin.    HTN: BP stable-continue metoprolol-amlodipine on hold.    ABG:    Component Value Date/Time   PHART 7.311 (L) 12/10/2011 1847   PCO2ART 43.0 12/10/2011 1847   PO2ART 83.0 12/10/2011 1847   HCO3 21.7 12/10/2011 1847   TCO2 27 12/29/2011 0112   ACIDBASEDEF 4.0 (H) 12/10/2011 1847   O2SAT 95.0 12/10/2011 1847    Vent Settings: N/A  Condition - Extremely Guarded  Family Communication  :  Sister in law -valerie-([phone]) updated on 8/21  Continues to express to me on 8/23 that he does not want me to call any of his family members.  He expressed the same thing on 8/22.  I have asked him to let me know if he changes his mind.   Code Status :  Full Code  Diet :  Diet Order            Diet Heart Room service appropriate? Yes; Fluid consistency: Thin  Diet effective now                  Disposition Plan  :   Status is: Inpatient  Remains inpatient appropriate because:Inpatient level of care appropriate  due to severity of illness  Dispo: The patient is from: Home              Anticipated d/c is to: TBS              Anticipated d/c date is: > 3 days              Patient currently is not medically stable to d/c.   Barriers to discharge: Hypoxia requiring O2 supplementation  Antimicorbials  :    Anti-infectives (From admission, onward)   Start     Dose/Rate Route Frequency Ordered Stop   03/18/20 1415  azithromycin (ZITHROMAX) 500 mg in sodium chloride 0.9 % 250 mL IVPB        500 mg 250 mL/hr over 60 Minutes Intravenous Every 24 hours 03/18/20 1414 03/20/20 1717   03/18/20 1415  cefTRIAXone (ROCEPHIN) 1 g in sodium chloride 0.9 % 100 mL IVPB        1 g 200 mL/hr over 30 Minutes Intravenous Every 24 hours 03/18/20 1414 03/20/20 1435   03/18/20 1000  remdesivir 100 mg in sodium chloride 0.9 % 100 mL IVPB  Status:  Discontinued       "Followed by" Linked Group Details   100 mg 200 mL/hr over 30 Minutes Intravenous Daily 03/17/20 1833 03/17/20 1835   03/18/20 1000  remdesivir 100 mg in sodium chloride 0.9 % 100 mL IVPB       "Followed by" Linked Group Details   100 mg 200 mL/hr over 30 Minutes Intravenous Every 24 hours 03/17/20 1826 03/21/20 1030   03/17/20 1930  remdesivir 200 mg in sodium chloride 0.9% 250 mL IVPB       "Followed by" Linked Group Details   200 mg 580 mL/hr over 30 Minutes Intravenous Once 03/17/20 1826 03/17/20 2044   03/17/20 1833  remdesivir 200 mg  in sodium chloride 0.9% 250 mL IVPB  Status:  Discontinued       "Followed by" Linked Group Details   200 mg 580 mL/hr over 30 Minutes Intravenous Once 03/17/20 1833 03/17/20 1835      Inpatient Medications  Scheduled Meds:  albuterol  2 puff Inhalation Q6H   vitamin C  500 mg Oral Daily   aspirin EC  81 mg Oral Daily   atorvastatin  80 mg Oral Daily   baricitinib  4 mg Oral Daily   clopidogrel  75 mg Oral Daily   enoxaparin (LOVENOX) injection  45 mg Subcutaneous Q24H   feeding supplement (ENSURE  ENLIVE)  237 mL Oral BID BM   methylPREDNISolone (SOLU-MEDROL) injection  60 mg Intravenous Q12H   metoprolol tartrate  25 mg Oral BID   pantoprazole  40 mg Oral Daily   ranolazine  500 mg Oral BID   sodium chloride flush  3 mL Intravenous Q12H   sodium zirconium cyclosilicate  10 g Oral BID   zinc sulfate  220 mg Oral Daily   Continuous Infusions:  sodium chloride     PRN Meds:.sodium chloride, acetaminophen, guaiFENesin-dextromethorphan, loperamide, nitroGLYCERIN, ondansetron **OR** ondansetron (ZOFRAN) IV, sodium chloride, sodium chloride flush   Time Spent in minutes  25   See all Orders from today for further details  Jeoffrey Massed M.D on 03/24/2020 at 2:08 PM  To page go to [email or web address] - use universal password  [facility] -  Office  [phone]    Objective:   Vitals:   03/24/20 0700 03/24/20 0720 03/24/20 0805 03/24/20 1232  BP:      Pulse:      Resp:  20    Temp:   97.7 F (36.5 C) 97.9 F (36.6 C)  TempSrc:   Oral Oral  SpO2: 92%     Weight:      Height:        Wt Readings from Last 3 Encounters:  03/17/20 88.5 kg  10/10/19 90 kg  03/28/19 87.5 kg     Intake/Output Summary (Last 24 hours) at 03/24/2020 1408 Last data filed at 03/24/2020 0900 Gross per 24 hour  Intake 960 ml  Output 1550 ml  Net -590 ml     Physical Exam Gen Exam:Alert awake-not in any distress HEENT:atraumatic, normocephalic Chest: B/L clear to auscultation anteriorly CVS:S1S2 regular Abdomen:soft non tender, non distended Extremities:no edema Neurology: Non focal Skin: no rash   Data Review:    CBC Recent Labs  Lab 03/18/20 0408 03/18/20 0408 03/19/20 0327 03/19/20 0327 03/20/20 0500 03/21/20 0738 03/22/20 0658 03/23/20 0300 03/24/20 0319  WBC 7.5   < > 10.9*   < > 12.5* 10.6* 11.2* 13.3* 13.4*  HGB 13.6   < > 12.5*   < > 11.6* 12.2* 12.2* 12.0* 12.2*  HCT 40.2   < > 36.8*   < > 35.7* 38.0* 37.1* 36.5* 37.0*  PLT 177   < > 193   < > 212  208 200 212 208  MCV 88.4   < > 88.2   < > 89.9 91.1 90.5 89.0 89.2  MCH 29.9   < > 30.0   < > 29.2 29.3 29.8 29.3 29.4  MCHC 33.8   < > 34.0   < > 32.5 32.1 32.9 32.9 33.0  RDW 12.8   < > 13.0   < > 13.0 12.8 12.6 12.3 12.3  LYMPHSABS 0.3*  --  0.7  --  0.5* 0.3* 0.3*  --   --  MONOABS 0.1  --  0.5  --  0.6 0.6 0.8  --   --   EOSABS 0.0  --  0.0  --  0.0 0.0 0.0  --   --   BASOSABS 0.0  --  0.0  --  0.0 0.0 0.0  --   --    < > = values in this interval not displayed.    Chemistries  Recent Labs  Lab 03/18/20 0408 03/18/20 0408 03/19/20 0327 03/19/20 0327 03/20/20 0500 03/21/20 0738 03/22/20 0658 03/23/20 0300 03/24/20 0319  NA 135   < > 136   < > 140 136 138 136 135  K 3.8   < > 4.2   < > 4.5 4.9 5.7* 5.2* 5.2*  CL 100   < > 103   < > 104 104 102 100 99  CO2 22   < > 22   < > 26 26 28 29 31   GLUCOSE 160*   < > 159*   < > 160* 169* 173* 191* 167*  BUN 26*   < > 25*   < > 26* 25* 25* 21 21  CREATININE 1.05   < > 0.93   < > 1.01 1.02 1.00 1.02 0.93  CALCIUM 8.7*   < > 8.5*   < > 8.8* 8.6* 8.9 8.8* 8.7*  MG 2.3  --  2.6*  --  2.5* 2.5* 2.4  --   --   AST 69*   < > 57*   < > 52* 46* 40 28 23  ALT 59*   < > 59*   < > 65* 72* 74* 66* 56*  ALKPHOS 63   < > 56   < > 50 55 52 58 51  BILITOT 1.0   < > 1.0   < > 0.9 1.0 1.1 0.8 0.9   < > = values in this interval not displayed.   ------------------------------------------------------------------------------------------------------------------ No results for input(s): CHOL, HDL, LDLCALC, TRIG, CHOLHDL, LDLDIRECT in the last 72 hours.  Lab Results  Component Value Date   HGBA1C 5.9 (H) 12/06/2011   ------------------------------------------------------------------------------------------------------------------ No results for input(s): TSH, T4TOTAL, T3FREE, THYROIDAB in the last 72 hours.  Invalid input(s):  FREET3 ------------------------------------------------------------------------------------------------------------------ Recent Labs    03/22/20 0658  FERRITIN 997*    Coagulation profile No results for input(s): INR, PROTIME in the last 168 hours.  Recent Labs    03/23/20 0300 03/24/20 0319  DDIMER 3.17* 3.24*    Cardiac Enzymes No results for input(s): CKMB, TROPONINI, MYOGLOBIN in the last 168 hours.  Invalid input(s): CK ------------------------------------------------------------------------------------------------------------------ No results found for: BNP  Micro Results Recent Results (from the past 240 hour(s))  Blood Culture (routine x 2)     Status: None   Collection Time: 03/17/20  4:20 PM   Specimen: BLOOD RIGHT ARM  Result Value Ref Range Status   Specimen Description BLOOD RIGHT ARM  Final   Special Requests   Final    BOTTLES DRAWN AEROBIC AND ANAEROBIC Blood Culture adequate volume   Culture   Final    NO GROWTH 5 DAYS Performed at [facility], [address] [place], [place]    Report Status 03/22/2020 FINAL  Final  Blood Culture (routine x 2)     Status: None   Collection Time: 03/17/20  4:50 PM   Specimen: BLOOD RIGHT HAND  Result Value Ref Range Status   Specimen Description BLOOD RIGHT HAND  Final   Special Requests  Final    BOTTLES DRAWN AEROBIC AND ANAEROBIC Blood Culture adequate volume   Culture   Final    NO GROWTH 5 DAYS Performed at [facility], [address], [place], [place] [address]    Report Status 03/22/2020 FINAL  Final  SARS Coronavirus 2 by RT PCR (hospital order, performed in [facility] hospital lab) Nasopharyngeal Nasopharyngeal Swab     Status: Abnormal   Collection Time: 03/17/20  5:20 PM   Specimen: Nasopharyngeal Swab  Result Value Ref Range Status   SARS Coronavirus 2 POSITIVE (A) NEGATIVE Final    Comment: RESULT CALLED TO, READ BACK BY AND VERIFIED WITH: J,[email or web address] @2001  03/17/20  EB (NOTE) SARS-CoV-2 target nucleic acids are DETECTED  SARS-CoV-2 RNA is generally detectable in upper respiratory specimens  during the acute phase of infection.  Positive results are indicative  of the presence of the identified virus, but do not rule out bacterial infection or co-infection with other pathogens not detected by the test.  Clinical correlation with patient history and  other diagnostic information is necessary to determine patient infection status.  The expected result is negative.  Fact Sheet for Patients:   03/19/20   Fact Sheet for Healthcare Providers:   BoilerBrush.com.cy    This test is not yet approved or cleared by the https://pope.com/ FDA and  has been authorized for detection and/or diagnosis of SARS-CoV-2 by FDA under an Emergency Use Authorization (EUA).  This EUA will remain in effect (meaning this test can  be used) for the duration of  the COVID-19 declaration under Section 564(b)(1) of the Act, 21 U.S.C. section 360-bbb-3(b)(1), unless the authorization is terminated or revoked sooner.  Performed at [facility], [address], [place], [place]     Radiology Reports DG Chest [place] 1 View  Result Date: 03/17/2020 CLINICAL DATA:  64 year old male with shortness of breath. Positive COVID-19. EXAM: PORTABLE CHEST 1 VIEW COMPARISON:  Chest radiograph dated 02/21/2012. FINDINGS: Bilateral mid to lower lung field streaky densities most consistent with multifocal pneumonia and in keeping with provided history of COVID-19. Clinical correlation and follow-up recommended. No pleural effusion pneumothorax. The cardiac silhouette is within limits. Median sternotomy wires and CABG vascular clips. No acute osseous pathology. IMPRESSION: Multifocal pneumonia. Clinical correlation and follow-up recommended. Electronically Signed   By: 02/23/2012 M.D.   On: 03/17/2020 16:03   VAS 03/19/2020 LOWER  EXTREMITY VENOUS (DVT)  Result Date: 03/19/2020  Lower Venous DVTStudy Indications: Covid positive with elevated d-dimer.  Comparison Study: No prior. Performing Technologist: 03/21/2020 RDMS, RVT  Examination Guidelines: A complete evaluation includes B-mode imaging, spectral Doppler, color Doppler, and power Doppler as needed of all accessible portions of each vessel. Bilateral testing is considered an integral part of a complete examination. Limited examinations for reoccurring indications may be performed as noted. The reflux portion of the exam is performed with the patient in reverse Trendelenburg.  +---------+---------------+---------+-----------+----------+--------------+  RIGHT     Compressibility Phasicity Spontaneity Properties Thrombus Aging  +---------+---------------+---------+-----------+----------+--------------+  CFV       Full            Yes       Yes                                    +---------+---------------+---------+-----------+----------+--------------+  SFJ       Full                                                             +---------+---------------+---------+-----------+----------+--------------+  FV Prox   Full                                                             +---------+---------------+---------+-----------+----------+--------------+  FV Mid    Full                                                             +---------+---------------+---------+-----------+----------+--------------+  FV Distal Full                                                             +---------+---------------+---------+-----------+----------+--------------+  PFV       Full                                                             +---------+---------------+---------+-----------+----------+--------------+  POP       Full            Yes       Yes                                    +---------+---------------+---------+-----------+----------+--------------+  PTV       Full                                                              +---------+---------------+---------+-----------+----------+--------------+  PERO      Full                                                             +---------+---------------+---------+-----------+----------+--------------+   +---------+---------------+---------+-----------+----------+--------------+  LEFT      Compressibility Phasicity Spontaneity Properties Thrombus Aging  +---------+---------------+---------+-----------+----------+--------------+  CFV       Full            Yes       Yes                                    +---------+---------------+---------+-----------+----------+--------------+  SFJ       Full                                                             +---------+---------------+---------+-----------+----------+--------------+  FV Prox   Full                                                             +---------+---------------+---------+-----------+----------+--------------+  FV Mid    Full                                                             +---------+---------------+---------+-----------+----------+--------------+  FV Distal Full                                                             +---------+---------------+---------+-----------+----------+--------------+  PFV       Full                                                             +---------+---------------+---------+-----------+----------+--------------+  POP       Full            Yes       Yes                                    +---------+---------------+---------+-----------+----------+--------------+  PTV       Full                                                             +---------+---------------+---------+-----------+----------+--------------+  PERO      Full                                                             +---------+---------------+---------+-----------+----------+--------------+     Summary: RIGHT: - There is no evidence of deep vein thrombosis in the lower  extremity.  LEFT: - There is no evidence of deep vein thrombosis in the lower extremity.  *See table(s) above for measurements and observations. Electronically signed by Gretta Beganodd Early MD on 03/19/2020 at 5:03:52 PM.    Final

## 2020-03-24 NOTE — Progress Notes (Signed)
Physical Therapy Treatment Patient Details Name: Michael Spears MRN: [id number] DOB: 1955-11-19 Today's Date: 03/24/2020    History of Present Illness 64 y.o. male with past medical history of hypertension, hyperlipidemia, history of tobacco abuse, hyper myalgia, history of coronary artery disease status post CABG, history of pericardial effusion presented to hospital with complaints of shortness of breath for 1 week.  Patient had a Covid test done on 8/13 in [facility] which was positive. Came to ED 03/17/20 due to dyspnea, hypotension    PT Comments    Patient with improved tolerance for activity and O2 needs have decreased since last PT session. On 4L he tolerated walking 80 ft in his room on 4L with sats>=88%. Donned mask and walked 400 ft in hallway with standing rest breaks. While on 4L his lowest saturation was 84% and able to recover with standing rest break x 1 minute with cues for pursed lip breathing. Cues for slowing velocity of gait with improved tolerance.    Follow Up Recommendations  No PT follow up     Equipment Recommendations  None recommended by PT    Recommendations for Other Services       Precautions / Restrictions Precautions Precautions: Fall    Mobility  Bed Mobility                  Transfers Overall transfer level: Modified independent                  Ambulation/Gait Ambulation/Gait assistance: Supervision Gait Distance (Feet): 100 Feet (x 4 with standing breaks to assess sats) Assistive device: None Gait Pattern/deviations: WFL(Within Functional Limits)     General Gait Details: cues to decr velocity to maintain sats   Stairs             Wheelchair Mobility    Modified Rankin (Stroke Patients Only)       Balance Overall balance assessment: Independent                                          Cognition Arousal/Alertness: Awake/alert Behavior During Therapy: WFL for tasks  assessed/performed Overall Cognitive Status: Within Functional Limits for tasks assessed                                 General Comments: Pt slightly irritable over his situation and inconsistencies in hospital staff.       Exercises      General Comments General comments (skin integrity, edema, etc.): Patient reports he has been doing exercises as instructed on 8/21. Reports he has been working on marching in place for up to 15 minutes at a time. Educated on watching monitor for drop in sats and accuracy of reading based on waveform.       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    PT Goals (current goals can now be found in the  care plan section) Acute Rehab PT Goals Patient Stated Goal: to breathe better  Time For Goal Achievement: 04/05/20 Potential to Achieve Goals: Good Progress towards PT goals: Progressing toward goals    Frequency    Min 3X/week      PT Plan Discharge plan needs to be updated;Equipment recommendations need to be updated    Co-evaluation              AM-PAC PT "6 Clicks" Mobility   Outcome Measure  Help needed turning from your back to your side while in a flat bed without using bedrails?: None Help needed moving from lying on your back to sitting on the side of a flat bed without using bedrails?: None Help needed moving to and from a bed to a chair (including a wheelchair)?: None Help needed standing up from a chair using your arms (e.g., wheelchair or bedside chair)?: None Help  needed to walk in hospital room?: A Little Help needed climbing 3-5 steps with a railing? : A Little 6 Click Score: 22    End of Session Equipment Utilized During Treatment: Oxygen Activity Tolerance: Patient tolerated treatment well Patient left: with call bell/phone within reach;in chair   PT Visit Diagnosis: Difficulty in walking, not elsewhere classified (R26.2);Muscle weakness (generalized) (M62.81)     Time: 0093-8182 PT Time Calculation (min) (ACUTE ONLY): 35 min  Charges:  $Gait Training: 23-37 mins                      Jerolyn Center, PT Pager [phone]    Zena Amos 03/24/2020, 6:31 PM

## 2020-03-25 LAB — GLUCOSE, CAPILLARY: Glucose-Capillary: 140 mg/dL — ABNORMAL HIGH (ref 70–99)

## 2020-03-25 LAB — D-DIMER, QUANTITATIVE: D-Dimer, Quant: 3.01 ug/mL-FEU — ABNORMAL HIGH (ref 0.00–0.50)

## 2020-03-25 LAB — COMPREHENSIVE METABOLIC PANEL
ALT: 48 U/L — ABNORMAL HIGH (ref 0–44)
AST: 21 U/L (ref 15–41)
Albumin: 2.5 g/dL — ABNORMAL LOW (ref 3.5–5.0)
Alkaline Phosphatase: 55 U/L (ref 38–126)
Anion gap: 8 (ref 5–15)
BUN: 27 mg/dL — ABNORMAL HIGH (ref 8–23)
CO2: 29 mmol/L (ref 22–32)
Calcium: 9 mg/dL (ref 8.9–10.3)
Chloride: 99 mmol/L (ref 98–111)
Creatinine, Ser: 0.96 mg/dL (ref 0.61–1.24)
GFR calc Af Amer: >60 mL/min (ref >60)
GFR calc non Af Amer: >60 mL/min (ref >60)
Glucose, Bld: 123 mg/dL — ABNORMAL HIGH (ref 70–99)
Potassium: 5.2 mmol/L — ABNORMAL HIGH (ref 3.5–5.1)
Sodium: 136 mmol/L (ref 135–145)
Total Bilirubin: 0.8 mg/dL (ref 0.3–1.2)
Total Protein: 4.9 g/dL — ABNORMAL LOW (ref 6.5–8.1)

## 2020-03-25 LAB — C-REACTIVE PROTEIN: CRP: 0.6 mg/dL (ref <1.0)

## 2020-03-25 LAB — CBC
HCT: 40.2 % (ref 39.0–52.0)
Hemoglobin: 13.3 g/dL (ref 13.0–17.0)
MCH: 29.8 pg (ref 26.0–34.0)
MCHC: 33.1 g/dL (ref 30.0–36.0)
MCV: 90.1 fL (ref 80.0–100.0)
Platelets: 211 10*3/uL (ref 150–400)
RBC: 4.46 MIL/uL (ref 4.22–5.81)
RDW: 12.4 % (ref 11.5–15.5)
WBC: 16.9 10*3/uL — ABNORMAL HIGH (ref 4.0–10.5)
nRBC: 0 % (ref 0.0–0.2)

## 2020-03-25 MED ORDER — PREDNISONE 10 MG PO TABS: ORAL_TABLET | ORAL | 0 refills | Status: DC

## 2020-03-25 MED ORDER — PANTOPRAZOLE SODIUM 40 MG PO TBEC
40.0000 mg | DELAYED_RELEASE_TABLET | Freq: Every day | ORAL | 0 refills | Status: DC
Start: 2020-03-26 — End: 2020-08-08

## 2020-03-25 MED ORDER — FUROSEMIDE 10 MG/ML IJ SOLN
20.0000 mg | Freq: Once | INTRAMUSCULAR | Status: AC
  Administered 2020-03-25: 20 mg via INTRAVENOUS
  Filled 2020-03-25: qty 2

## 2020-03-25 MED ORDER — SODIUM ZIRCONIUM CYCLOSILICATE 10 G PO PACK: 10.0000 g | PACK | Freq: Every day | ORAL | 0 refills | Status: AC

## 2020-03-25 MED ORDER — ALBUTEROL SULFATE HFA 108 (90 BASE) MCG/ACT IN AERS: 2.0000 | INHALATION_SPRAY | Freq: Four times a day (QID) | RESPIRATORY_TRACT | 0 refills | Status: AC | PRN

## 2020-03-25 MED FILL — predniSONE 10 MG TABS: 10 | 4 days supply | Qty: 10 | Fill #0

## 2020-03-25 MED FILL — LOKELMA 10 GM PACK: 10 | 3 days supply | Qty: 3 | Fill #0

## 2020-03-25 MED FILL — PANTOPRAZOLE SOD DR 40 MG T: 40 | 30 days supply | Qty: 30 | Fill #0

## 2020-03-25 MED FILL — ALBUTEROL SULFATE HFA 108 (: 108 (90 BAS | 11 days supply | Qty: 9 | Fill #0

## 2020-03-25 NOTE — TOC Transition Note (Signed)
Transition of Care [facility]) - CM/SW Discharge Note   Patient Details  Name: Michael Spears MRN: [id number] Date of Birth: 1956/05/11  [facility]) CM/SW Contact:  Lawerance Sabal, RN Phone Number: 03/25/2020, 9:30 AM   Clinical Narrative:    SPoke w patient over the phone. He states that he will be able to obtain a ride home from family. His son will be home to accept delivery of home concentrator. Oxygen for home use ordered through [employer] who will deliver tanks to the unit and set up home concentrator at house today prior to DC.  Please verify with patient that home concentrator has been delivered to the house prior to release.     Final next level of care: Home/Self Care Barriers to Discharge: No Barriers Identified   Patient Goals and CMS Choice        Discharge Placement                       Discharge Plan and Services                DME Arranged: Oxygen DME Agency:  [facility]) Date DME Agency Contacted: 03/25/20 Time DME Agency Contacted: 0930 Representative spoke with at DME Agency: Vaughan Basta            Social Determinants of Health (SDOH) Interventions     Readmission Risk Interventions No flowsheet data found.

## 2020-03-25 NOTE — Progress Notes (Signed)
Home O2 evaluation completed by RN:  Patient Saturations on Room Air at Rest = 90%  Patient Saturations on Room Air while Ambulating = 87%  Patient Saturations on 2Liters of oxygen while Ambulating = 92%   Please briefly explain why patient needs home oxygen: Home O2 eval per MD ordered.

## 2020-03-25 NOTE — Discharge Summary (Signed)
PATIENT DETAILS Name: Michael Spears Age: 64 y.o. Sex: male Date of Birth: 09-02-1955 MRN: [id number]. Admitting Physician: Starleen Armsawood S Elgergawy, MD PCP:Sun, Charise CarwinVyvyan, MD  Admit Date: 03/17/2020 Discharge date: 03/25/2020  Recommendations for Outpatient Follow-up:  1. Follow up with PCP in 1-2 weeks 2. Please obtain CMP/CBC in one week 3. Repeat Chest Xray in 4-6 week 4. Please reassess whether patient still needs home O2 at next visit.  Admitted From:  Home   Disposition: Home   Home Health:  Yes  Equipment/Devices:  oxygen 2L at rest, 4L with ambulation  Discharge Condition: Stable  CODE STATUS: FULL CODE  Diet recommendation:  Diet Order            Diet - low sodium heart healthy           Diet Heart Room service appropriate? Yes; Fluid consistency: Thin  Diet effective now                  Brief Narrative: Patient is a 64 y.o. male with PMHx of HTN, HLD, CAD s/p CABG, history of pericardial effusion-presented with 1 week shortness of breath-found to have acute hypoxic respiratory failure secondary to COVID-19 pneumonia.  Patient had severe hypoxemia requiring heated high flow and NRB at 1 point-but gradually improved during this hospital stay.  Being discharged home on home O2.  See below for further details.  COVID-19 vaccinated status: Unvaccinated  Significant Events: 8/13>> COVID-19 positive ([facility]) 8/16>> [facility] to [facility] for hypoxia from COVID-19 pneumonia  Significant studies: 8/16>>Chest x-ray: Multifocal pneumonia 8/18>> bilateral lower extremity Doppler: No DVT   COVID-19 medications: Steroids: 8/16>> Remdesivir: 8/16>> 8/20 Baricitinib: 8/16>>  Antibiotics: Rocephin: 8/17>>8/19 Zithromax: 8/17>>8/19  Microbiology data: 8/16 >>blood culture: No growth  Procedures: None  Consults: None  Brief Hospital Course: Acute Hypoxic Resp Failure due to Covid 19 Viral pneumonia:  Significantly better-was on heated  high flow and NRB at 1 point-now down to just 2 L of oxygen at rest-appears to require on 4 L with ambulation.  Treated with a course of steroids/remdesivir and baricitinib.  Since significantly better-deemed stable to be discharged home with home O2-we will require follow-up with PCP in the next week or so to determine if patient still requires O2.  Please repeat chest x-ray in 4 to 6 weeks-if he has persistent infiltrates-please consider referral to pulmonology.    COVID-19 Labs:  Recent Labs    03/23/20 0300 03/24/20 0319 03/25/20 0602  DDIMER 3.17* 3.24* 3.01*  CRP 0.8 0.7 0.6    Lab Results  Component Value Date   SARSCOV2NAA POSITIVE (A) 03/17/2020     Elevated D-dimer: Hypoxia improving-lower extremity Doppler negative-do not think patient requires any further imaging at this point.    Will be on aspirin/Plavix on discharge.  Encourage ambulation.  Mild hyperkalemia: Continues to have mild hyperkalemia-continue Lokelma.  Repeat electrolytes at next visit with PCP.  Transaminitis: Mild-downtrending-stable for continued follow-up.  Etiology likely secondary to COVID-19.  CAD-s/p CABG 2013: No anginal symptoms-continue aspirin/Plavix/beta-blocker/statin.    HTN: BP stable-continue metoprolol-amlodipine on hold.    PCP to resume amlodipine when able.  Discharge Diagnoses:  Principal Problem:   Pneumonia due to COVID-19 virus Active Problems:   Hyperlipidemia   S/P CABG x 04 Dec 2011   Dyspnea   Essential hypertension   Acute respiratory disease due to COVID-19 virus   Discharge Instructions:    Person Under Monitoring Name: Michael Spears  Location: [address]  [place] [address]   Infection Prevention Recommendations for Individuals Confirmed to have, or Being Evaluated for, 2019 Novel Coronavirus (COVID-19) Infection Who Receive Care at Home  Individuals who are confirmed to have, or are being evaluated for, COVID-19 should follow the prevention steps  below until a healthcare provider or local or state health department says they can return to normal activities.  Stay home except to get medical care You should restrict activities outside your home, except for getting medical care. Do not go to work, school, or public areas, and do not use public transportation or taxis.  Call ahead before visiting your doctor Before your medical appointment, call the healthcare provider and tell them that you have, or are being evaluated for, COVID-19 infection. This will help the healthcare provider's office take steps to keep other people from getting infected. Ask your healthcare provider to call the local or state health department.  Monitor your symptoms Seek prompt medical attention if your illness is worsening (e.g., difficulty breathing). Before going to your medical appointment, call the healthcare provider and tell them that you have, or are being evaluated for, COVID-19 infection. Ask your healthcare provider to call the local or state health department.  Wear a facemask You should wear a facemask that covers your nose and mouth when you are in the same room with other people and when you visit a healthcare provider. People who live with or visit you should also wear a facemask while they are in the same room with you.  Separate yourself from other people in your home As much as possible, you should stay in a different room from other people in your home. Also, you should use a separate bathroom, if available.  Avoid sharing household items You should not share dishes, drinking glasses, cups, eating utensils, towels, bedding, or other items with other people in your home. After using these items, you should wash them thoroughly with soap and water.  Cover your coughs and sneezes Cover your mouth and nose with a tissue when you cough or sneeze, or you can cough or sneeze into your sleeve. Throw used tissues in a lined trash can, and  immediately wash your hands with soap and water for at least 20 seconds or use an alcohol-based hand rub.  Wash your [employer] your hands often and thoroughly with soap and water for at least 20 seconds. You can use an alcohol-based hand sanitizer if soap and water are not available and if your hands are not visibly dirty. Avoid touching your eyes, nose, and mouth with unwashed hands.   Prevention Steps for Caregivers and Household Members of Individuals Confirmed to have, or Being Evaluated for, COVID-19 Infection Being Cared for in the Home  If you live with, or provide care at home for, a person confirmed to have, or being evaluated for, COVID-19 infection please follow these guidelines to prevent infection:  Follow healthcare provider's instructions Make sure that you understand and can help the patient follow any healthcare provider instructions for all care.  Provide for the patient's basic needs You should help the patient with basic needs in the home and provide support for getting groceries, prescriptions, and other personal needs.  Monitor the patient's symptoms If they are getting sicker, call his or her medical provider and tell them that the patient has, or is being evaluated for, COVID-19 infection. This will help the healthcare provider's office take steps to keep other people from getting infected. Ask the healthcare provider  to call the local or state health department.  Limit the number of people who have contact with the patient  If possible, have only one caregiver for the patient.  Other household members should stay in another home or place of residence. If this is not possible, they should stay  in another room, or be separated from the patient as much as possible. Use a separate bathroom, if available.  Restrict visitors who do not have an essential need to be in the home.  Keep older adults, very young children, and other sick people away from the  patient Keep older adults, very young children, and those who have compromised immune systems or chronic health conditions away from the patient. This includes people with chronic heart, lung, or kidney conditions, diabetes, and cancer.  Ensure good ventilation Make sure that shared spaces in the home have good air flow, such as from an air conditioner or an opened window, weather permitting.  Wash your hands often  Wash your hands often and thoroughly with soap and water for at least 20 seconds. You can use an alcohol based hand sanitizer if soap and water are not available and if your hands are not visibly dirty.  Avoid touching your eyes, nose, and mouth with unwashed hands.  Use disposable paper towels to dry your hands. If not available, use dedicated cloth towels and replace them when they become wet.  Wear a facemask and gloves  Wear a disposable facemask at all times in the room and gloves when you touch or have contact with the patient's blood, body fluids, and/or secretions or excretions, such as sweat, saliva, sputum, nasal mucus, vomit, urine, or feces.  Ensure the mask fits over your nose and mouth tightly, and do not touch it during use.  Throw out disposable facemasks and gloves after using them. Do not reuse.  Wash your hands immediately after removing your facemask and gloves.  If your personal clothing becomes contaminated, carefully remove clothing and launder. Wash your hands after handling contaminated clothing.  Place all used disposable facemasks, gloves, and other waste in a lined container before disposing them with other household waste.  Remove gloves and wash your hands immediately after handling these items.  Do not share dishes, glasses, or other household items with the patient  Avoid sharing household items. You should not share dishes, drinking glasses, cups, eating utensils, towels, bedding, or other items with a patient who is confirmed to have, or  being evaluated for, COVID-19 infection.  After the person uses these items, you should wash them thoroughly with soap and water.  Wash laundry thoroughly  Immediately remove and wash clothes or bedding that have blood, body fluids, and/or secretions or excretions, such as sweat, saliva, sputum, nasal mucus, vomit, urine, or feces, on them.  Wear gloves when handling laundry from the patient.  Read and follow directions on labels of laundry or clothing items and detergent. In general, wash and dry with the warmest temperatures recommended on the label.  Clean all areas the individual has used often  Clean all touchable surfaces, such as counters, tabletops, doorknobs, bathroom fixtures, toilets, phones, keyboards, tablets, and bedside tables, every day. Also, clean any surfaces that may have blood, body fluids, and/or secretions or excretions on them.  Wear gloves when cleaning surfaces the patient has come in contact with.  Use a diluted bleach solution (e.g., dilute bleach with 1 part bleach and 10 parts water) or a household disinfectant with a label that  says EPA-registered for coronaviruses. To make a bleach solution at home, add 1 tablespoon of bleach to 1 quart (4 cups) of water. For a larger supply, add  cup of bleach to 1 gallon (16 cups) of water.  Read labels of cleaning products and follow recommendations provided on product labels. Labels contain instructions for safe and effective use of the cleaning product including precautions you should take when applying the product, such as wearing gloves or eye protection and making sure you have good ventilation during use of the product.  Remove gloves and wash hands immediately after cleaning.  Monitor yourself for signs and symptoms of illness Caregivers and household members are considered close contacts, should monitor their health, and will be asked to limit movement outside of the home to the extent possible. Follow the  monitoring steps for close contacts listed on the symptom monitoring form.   ? If you have additional questions, contact your local health department or call the epidemiologist on call at [phone] (available 24/7). ? This guidance is subject to change. For the most up-to-date guidance from [facility], please refer to their website: [employer]    Activity:  As tolerated  Discharge Instructions    Call MD for:  difficulty breathing, headache or visual disturbances   Complete by: As directed    Diet - low sodium heart healthy   Complete by: As directed    Discharge instructions   Complete by: As directed    Follow with Primary MD  [name], MD in 1-2 weeks  3 weeks of isolation from 03/14/20  Please get a complete blood count and chemistry panel checked by your Primary MD at your next visit, and again as instructed by your Primary MD.  Get Medicines reviewed and adjusted: Please take all your medications with you for your next visit with your Primary MD  Laboratory/radiological data: Please request your Primary MD to go over all hospital tests and procedure/radiological results at the follow up, please ask your Primary MD to get all Hospital records sent to his/her office.  In some cases, they will be blood work, cultures and biopsy results pending at the time of your discharge. Please request that your primary care M.D. follows up on these results.  Also Note the following: If you experience worsening of your admission symptoms, develop shortness of breath, life threatening emergency, suicidal or homicidal thoughts you must seek medical attention immediately by calling 911 or calling your MD immediately  if symptoms less severe.  You must read complete instructions/literature along with all the possible adverse reactions/side effects for all the Medicines you take and that have been prescribed to you. Take any new  Medicines after you have completely understood and accpet all the possible adverse reactions/side effects.   Do not drive when taking Pain medications or sleeping medications (Benzodaizepines)  Do not take more than prescribed Pain, Sleep and Anxiety Medications. It is not advisable to combine anxiety,sleep and pain medications without talking with your primary care practitioner  Special Instructions: If you have smoked or chewed Tobacco  in the last 2 yrs please stop smoking, stop any regular Alcohol  and or any Recreational drug use.  Wear Seat belts while driving.  Please note: You were cared for by a hospitalist during your hospital stay. Once you are discharged, your primary care physician will handle any further medical issues. Please note that NO REFILLS for any discharge medications will be authorized once you are discharged, as it is imperative that  you return to your primary care physician (or establish a relationship with a primary care physician if you do not have one) for your post hospital discharge needs so that they can reassess your need for medications and monitor your lab values.   Increase activity slowly   Complete by: As directed      Allergies as of 03/25/2020   No Known Allergies     Medication List    STOP taking these medications   amLODipine 5 MG tablet Commonly known as: NORVASC     TAKE these medications   albuterol 108 (90 Base) MCG/ACT inhaler Commonly known as: VENTOLIN HFA Inhale 2 puffs into the lungs every 6 (six) hours as needed for wheezing or shortness of breath.   aspirin 81 MG EC tablet Take 1 tablet (81 mg total) by mouth daily.   atorvastatin 80 MG tablet Commonly known as: LIPITOR TAKE 1 TABLET BY MOUTH EVERY DAY   clopidogrel 75 MG tablet Commonly known as: PLAVIX Take 1 tablet (75 mg total) by mouth daily.   metoprolol tartrate 25 MG tablet Commonly known as: LOPRESSOR TAKE 1 TABLET (25 MG TOTAL) BY MOUTH 2 (TWO) TIMES DAILY.  3 What changed: additional instructions   nitroGLYCERIN 0.4 MG SL tablet Commonly known as: NITROSTAT Place 1 tablet (0.4 mg total) under the tongue every 5 (five) minutes as needed for chest pain (up to 3 doses).   pantoprazole 40 MG tablet Commonly known as: PROTONIX Take 1 tablet (40 mg total) by mouth daily. Start taking on: March 26, 2020   predniSONE 10 MG tablet Commonly known as: DELTASONE Take 40 mg daily for 1 day, 30 mg daily for 1 day, 20 mg daily for 1 days,10 mg daily for 1 day, then stop   ranolazine 500 MG 12 hr tablet Commonly known as: RANEXA Take 1 tablet (500 mg total) by mouth 2 (two) times daily.   sodium zirconium cyclosilicate 10 g Pack packet Commonly known as: LOKELMA Take 10 g by mouth daily for 3 days.            Durable Medical Equipment  (From admission, onward)         Start     Ordered   03/25/20 0714  For home use only DME oxygen  Once       Question Answer Comment  Length of Need 6 Months   Mode or (Route) Nasal cannula   Liters per Minute 4   Frequency Continuous (stationary and portable oxygen unit needed)   Oxygen conserving device Yes   Oxygen delivery system Gas      03/25/20 0713          Follow-up Information    [facility] Follow up.   Contact information: [address] [place] [address] [phone]              No Known Allergies     Other Procedures/Studies: DG Chest Port 1 View  Result Date: 03/17/2020 CLINICAL DATA:  64 year old male with shortness of breath. Positive COVID-19. EXAM: PORTABLE CHEST 1 VIEW COMPARISON:  Chest radiograph dated 02/21/2012. FINDINGS: Bilateral mid to lower lung field streaky densities most consistent with multifocal pneumonia and in keeping with provided history of COVID-19. Clinical correlation and follow-up recommended. No pleural effusion pneumothorax. The cardiac silhouette is within limits. Median sternotomy wires and CABG vascular clips. No acute  osseous pathology. IMPRESSION: Multifocal pneumonia. Clinical correlation and follow-up recommended. Electronically Signed   By: Elgie Collard  M.D.   On: 03/17/2020 16:03   VAS [place] LOWER EXTREMITY VENOUS (DVT)  Result Date: 03/19/2020  Lower Venous DVTStudy Indications: Covid positive with elevated d-dimer.  Comparison Study: No prior. Performing Technologist: Marilynne Halsted RDMS, RVT  Examination Guidelines: A complete evaluation includes B-mode imaging, spectral Doppler, color Doppler, and power Doppler as needed of all accessible portions of each vessel. Bilateral testing is considered an integral part of a complete examination. Limited examinations for reoccurring indications may be performed as noted. The reflux portion of the exam is performed with the patient in reverse Trendelenburg.  +---------+---------------+---------+-----------+----------+--------------+ RIGHT    CompressibilityPhasicitySpontaneityPropertiesThrombus Aging +---------+---------------+---------+-----------+----------+--------------+ CFV      Full           Yes      Yes                                 +---------+---------------+---------+-----------+----------+--------------+ SFJ      Full                                                        +---------+---------------+---------+-----------+----------+--------------+ FV Prox  Full                                                        +---------+---------------+---------+-----------+----------+--------------+ FV Mid   Full                                                        +---------+---------------+---------+-----------+----------+--------------+ FV DistalFull                                                        +---------+---------------+---------+-----------+----------+--------------+ PFV      Full                                                         +---------+---------------+---------+-----------+----------+--------------+ POP      Full           Yes      Yes                                 +---------+---------------+---------+-----------+----------+--------------+ PTV      Full                                                        +---------+---------------+---------+-----------+----------+--------------+ PERO     Full                                                        +---------+---------------+---------+-----------+----------+--------------+   +---------+---------------+---------+-----------+----------+--------------+  LEFT     CompressibilityPhasicitySpontaneityPropertiesThrombus Aging +---------+---------------+---------+-----------+----------+--------------+ CFV      Full           Yes      Yes                                 +---------+---------------+---------+-----------+----------+--------------+ SFJ      Full                                                        +---------+---------------+---------+-----------+----------+--------------+ FV Prox  Full                                                        +---------+---------------+---------+-----------+----------+--------------+ FV Mid   Full                                                        +---------+---------------+---------+-----------+----------+--------------+ FV DistalFull                                                        +---------+---------------+---------+-----------+----------+--------------+ PFV      Full                                                        +---------+---------------+---------+-----------+----------+--------------+ POP      Full           Yes      Yes                                 +---------+---------------+---------+-----------+----------+--------------+ PTV      Full                                                         +---------+---------------+---------+-----------+----------+--------------+ PERO     Full                                                        +---------+---------------+---------+-----------+----------+--------------+     Summary: RIGHT: - There is no evidence of deep vein thrombosis in the lower extremity.  LEFT: - There is no evidence of deep vein thrombosis in the lower extremity.  *See table(s) above for measurements and observations. Electronically signed by Gretta Began  MD on 03/19/2020 at 5:03:52 PM.    Final      TODAY-DAY OF DISCHARGE:  Subjective:   Wynn Banker today has no headache,no chest abdominal pain,no new weakness tingling or numbness, feels much better wants to go home today.   Objective:   Blood pressure 119/70, pulse 69, temperature 98.1 F (36.7 C), temperature source Oral, resp. rate 20, height 5\' 10"  (1.778 m), weight 88.5 kg, SpO2 95 %.  Intake/Output Summary (Last 24 hours) at 03/25/2020 1040 Last data filed at 03/25/2020 0900 Gross per 24 hour  Intake 1180 ml  Output 2000 ml  Net -820 ml   Filed Weights   03/17/20 1537  Weight: 88.5 kg    Exam: Awake Alert, Oriented *3, No new F.N deficits, Normal affect Rosa Sanchez.AT,PERRAL Supple Neck,No JVD, No cervical lymphadenopathy appriciated.  Symmetrical Chest wall movement, Good air movement bilaterally, CTAB RRR,No Gallops,Rubs or new Murmurs, No Parasternal Heave +ve B.Sounds, Abd Soft, Non tender, No organomegaly appriciated, No rebound -guarding or rigidity. No Cyanosis, Clubbing or edema, No new Rash or bruise   PERTINENT RADIOLOGIC STUDIES: DG Chest Port 1 View  Result Date: 03/17/2020 CLINICAL DATA:  64 year old male with shortness of breath. Positive COVID-19. EXAM: PORTABLE CHEST 1 VIEW COMPARISON:  Chest radiograph dated 02/21/2012. FINDINGS: Bilateral mid to lower lung field streaky densities most consistent with multifocal pneumonia and in keeping with provided history of COVID-19. Clinical  correlation and follow-up recommended. No pleural effusion pneumothorax. The cardiac silhouette is within limits. Median sternotomy wires and CABG vascular clips. No acute osseous pathology. IMPRESSION: Multifocal pneumonia. Clinical correlation and follow-up recommended. Electronically Signed   By: Elgie Collard M.D.   On: 03/17/2020 16:03   VAS [place] LOWER EXTREMITY VENOUS (DVT)  Result Date: 03/19/2020  Lower Venous DVTStudy Indications: Covid positive with elevated d-dimer.  Comparison Study: No prior. Performing Technologist: Marilynne Halsted RDMS, RVT  Examination Guidelines: A complete evaluation includes B-mode imaging, spectral Doppler, color Doppler, and power Doppler as needed of all accessible portions of each vessel. Bilateral testing is considered an integral part of a complete examination. Limited examinations for reoccurring indications may be performed as noted. The reflux portion of the exam is performed with the patient in reverse Trendelenburg.  +---------+---------------+---------+-----------+----------+--------------+ RIGHT    CompressibilityPhasicitySpontaneityPropertiesThrombus Aging +---------+---------------+---------+-----------+----------+--------------+ CFV      Full           Yes      Yes                                 +---------+---------------+---------+-----------+----------+--------------+ SFJ      Full                                                        +---------+---------------+---------+-----------+----------+--------------+ FV Prox  Full                                                        +---------+---------------+---------+-----------+----------+--------------+ FV Mid   Full                                                        +---------+---------------+---------+-----------+----------+--------------+  FV DistalFull                                                         +---------+---------------+---------+-----------+----------+--------------+ PFV      Full                                                        +---------+---------------+---------+-----------+----------+--------------+ POP      Full           Yes      Yes                                 +---------+---------------+---------+-----------+----------+--------------+ PTV      Full                                                        +---------+---------------+---------+-----------+----------+--------------+ PERO     Full                                                        +---------+---------------+---------+-----------+----------+--------------+   +---------+---------------+---------+-----------+----------+--------------+ LEFT     CompressibilityPhasicitySpontaneityPropertiesThrombus Aging +---------+---------------+---------+-----------+----------+--------------+ CFV      Full           Yes      Yes                                 +---------+---------------+---------+-----------+----------+--------------+ SFJ      Full                                                        +---------+---------------+---------+-----------+----------+--------------+ FV Prox  Full                                                        +---------+---------------+---------+-----------+----------+--------------+ FV Mid   Full                                                        +---------+---------------+---------+-----------+----------+--------------+ FV DistalFull                                                        +---------+---------------+---------+-----------+----------+--------------+  PFV      Full                                                        +---------+---------------+---------+-----------+----------+--------------+ POP      Full           Yes      Yes                                  +---------+---------------+---------+-----------+----------+--------------+ PTV      Full                                                        +---------+---------------+---------+-----------+----------+--------------+ PERO     Full                                                        +---------+---------------+---------+-----------+----------+--------------+     Summary: RIGHT: - There is no evidence of deep vein thrombosis in the lower extremity.  LEFT: - There is no evidence of deep vein thrombosis in the lower extremity.  *See table(s) above for measurements and observations. Electronically signed by Gretta Began MD on 03/19/2020 at 5:03:52 PM.    Final      PERTINENT LAB RESULTS: CBC: Recent Labs    03/24/20 0319 03/25/20 0602  WBC 13.4* 16.9*  HGB 12.2* 13.3  HCT 37.0* 40.2  PLT 208 211   CMET CMP     Component Value Date/Time   NA 136 03/25/2020 0602   NA 140 10/10/2019 0945   K 5.2 (H) 03/25/2020 0602   CL 99 03/25/2020 0602   CO2 29 03/25/2020 0602   GLUCOSE 123 (H) 03/25/2020 0602   BUN 27 (H) 03/25/2020 0602   BUN 15 10/10/2019 0945   CREATININE 0.96 03/25/2020 0602   CALCIUM 9.0 03/25/2020 0602   PROT 4.9 (L) 03/25/2020 0602   PROT 6.2 10/10/2019 0945   ALBUMIN 2.5 (L) 03/25/2020 0602   ALBUMIN 4.7 10/10/2019 0945   AST 21 03/25/2020 0602   ALT 48 (H) 03/25/2020 0602   ALKPHOS 55 03/25/2020 0602   BILITOT 0.8 03/25/2020 0602   BILITOT 0.7 10/10/2019 0945   GFRNONAA >60 03/25/2020 0602   GFRAA >60 03/25/2020 0602    GFR Estimated Creatinine Clearance: 87.1 mL/min (by C-G formula based on SCr of 0.96 mg/dL). No results for input(s): LIPASE, AMYLASE in the last 72 hours. No results for input(s): CKTOTAL, CKMB, CKMBINDEX, TROPONINI in the last 72 hours. Invalid input(s): POCBNP Recent Labs    03/24/20 0319 03/25/20 0602  DDIMER 3.24* 3.01*   No results for input(s): HGBA1C in the last 72 hours. No results for input(s): CHOL, HDL, LDLCALC,  TRIG, CHOLHDL, LDLDIRECT in the last 72 hours. No results for input(s): TSH, T4TOTAL, T3FREE, THYROIDAB in the last 72 hours.  Invalid input(s): FREET3 No results for input(s): VITAMINB12, FOLATE, FERRITIN, TIBC, IRON, RETICCTPCT in the last 72 hours. Coags: No  results for input(s): INR in the last 72 hours.  Invalid input(s): PT Microbiology: Recent Results (from the past 240 hour(s))  Blood Culture (routine x 2)     Status: None   Collection Time: 03/17/20  4:20 PM   Specimen: BLOOD RIGHT ARM  Result Value Ref Range Status   Specimen Description BLOOD RIGHT ARM  Final   Special Requests   Final    BOTTLES DRAWN AEROBIC AND ANAEROBIC Blood Culture adequate volume   Culture   Final    NO GROWTH 5 DAYS Performed at [facility], [address], [place], [place] [address]    Report Status 03/22/2020 FINAL  Final  Blood Culture (routine x 2)     Status: None   Collection Time: 03/17/20  4:50 PM   Specimen: BLOOD RIGHT HAND  Result Value Ref Range Status   Specimen Description BLOOD RIGHT HAND  Final   Special Requests   Final    BOTTLES DRAWN AEROBIC AND ANAEROBIC Blood Culture adequate volume   Culture   Final    NO GROWTH 5 DAYS Performed at [facility], [address], [place], [place] [address]    Report Status 03/22/2020 FINAL  Final  SARS Coronavirus 2 by RT PCR (hospital order, performed in [facility] hospital lab) Nasopharyngeal Nasopharyngeal Swab     Status: Abnormal   Collection Time: 03/17/20  5:20 PM   Specimen: Nasopharyngeal Swab  Result Value Ref Range Status   SARS Coronavirus 2 POSITIVE (A) NEGATIVE Final    Comment: RESULT CALLED TO, READ BACK BY AND VERIFIED WITH: J,SAWATZKI @2001  03/17/20 EB (NOTE) SARS-CoV-2 target nucleic acids are DETECTED  SARS-CoV-2 RNA is generally detectable in upper respiratory specimens  during the acute phase of infection.  Positive results are indicative  of the presence of the identified virus, but do not rule  out bacterial infection or co-infection with other pathogens not detected by the test.  Clinical correlation with patient history and  other diagnostic information is necessary to determine patient infection status.  The expected result is negative.  Fact Sheet for Patients:   03/19/20   Fact Sheet for Healthcare Providers:   BoilerBrush.com.cy    This test is not yet approved or cleared by the https://pope.com/ FDA and  has been authorized for detection and/or diagnosis of SARS-CoV-2 by FDA under an Emergency Use Authorization (EUA).  This EUA will remain in effect (meaning this test can  be used) for the duration of  the COVID-19 declaration under Section 564(b)(1) of the Act, 21 U.S.C. section 360-bbb-3(b)(1), unless the authorization is terminated or revoked sooner.  Performed at [facility], [address], [place], [place]     FURTHER DISCHARGE INSTRUCTIONS:  Get Medicines reviewed and adjusted: Please take all your medications with you for your next visit with your Primary MD  Laboratory/radiological data: Please request your Primary MD to go over all hospital tests and procedure/radiological results at the follow up, please ask your Primary MD to get all Hospital records sent to his/her office.  In some cases, they will be blood work, cultures and biopsy results pending at the time of your discharge. Please request that your primary care M.D. goes through all the records of your hospital data and follows up on these results.  Also Note the following: If you experience worsening of your admission symptoms, develop shortness of breath, life threatening emergency, suicidal or homicidal thoughts you must seek medical attention  immediately by calling 911 or calling your MD immediately  if symptoms less severe.  You must read complete instructions/literature along with all the possible adverse reactions/side  effects for all the Medicines you take and that have been prescribed to you. Take any new Medicines after you have completely understood and accpet all the possible adverse reactions/side effects.   Do not drive when taking Pain medications or sleeping medications (Benzodaizepines)  Do not take more than prescribed Pain, Sleep and Anxiety Medications. It is not advisable to combine anxiety,sleep and pain medications without talking with your primary care practitioner  Special Instructions: If you have smoked or chewed Tobacco  in the last 2 yrs please stop smoking, stop any regular Alcohol  and or any Recreational drug use.  Wear Seat belts while driving.  Please note: You were cared for by a hospitalist during your hospital stay. Once you are discharged, your primary care physician will handle any further medical issues. Please note that NO REFILLS for any discharge medications will be authorized once you are discharged, as it is imperative that you return to your primary care physician (or establish a relationship with a primary care physician if you do not have one) for your post hospital discharge needs so that they can reassess your need for medications and monitor your lab values.  Total Time spent coordinating discharge including counseling, education and face to face time equals 35 minutes.  Signed: Jeoffrey Massed 03/25/2020 10:40 AM

## 2020-03-28 DIAGNOSIS — U071 COVID-19: Secondary | ICD-10-CM | POA: Diagnosis not present

## 2020-03-28 DIAGNOSIS — E785 Hyperlipidemia, unspecified: Secondary | ICD-10-CM | POA: Diagnosis not present

## 2020-03-28 DIAGNOSIS — I10 Essential (primary) hypertension: Secondary | ICD-10-CM | POA: Diagnosis not present

## 2020-03-28 DIAGNOSIS — J1282 Pneumonia due to coronavirus disease 2019: Secondary | ICD-10-CM | POA: Diagnosis not present

## 2020-04-04 ENCOUNTER — Ambulatory Visit
Admission: RE | Admit: 2020-04-04 | Discharge: 2020-04-04 | Disposition: A | Discharge status: Home / Self Care | Payer: BC Managed Care – PPO | Source: Ambulatory Visit | Attending: Family Medicine | Admitting: Family Medicine

## 2020-04-04 ENCOUNTER — Other Ambulatory Visit: Payer: Self-pay | Admitting: Family Medicine

## 2020-04-04 DIAGNOSIS — U071 COVID-19: Secondary | ICD-10-CM

## 2020-04-04 DIAGNOSIS — R0602 Shortness of breath: Secondary | ICD-10-CM

## 2020-04-04 DIAGNOSIS — Z8616 Personal history of COVID-19: Secondary | ICD-10-CM

## 2020-04-07 ENCOUNTER — Other Ambulatory Visit: Payer: Self-pay | Admitting: Cardiology

## 2020-04-07 ENCOUNTER — Other Ambulatory Visit: Payer: Self-pay | Admitting: Cardiovascular Disease

## 2020-04-07 DIAGNOSIS — E785 Hyperlipidemia, unspecified: Secondary | ICD-10-CM

## 2020-04-07 DIAGNOSIS — I2510 Atherosclerotic heart disease of native coronary artery without angina pectoris: Secondary | ICD-10-CM

## 2020-04-11 DIAGNOSIS — J1282 Pneumonia due to coronavirus disease 2019: Secondary | ICD-10-CM | POA: Diagnosis not present

## 2020-04-11 DIAGNOSIS — U071 COVID-19: Secondary | ICD-10-CM | POA: Diagnosis not present

## 2020-04-11 DIAGNOSIS — R05 Cough: Secondary | ICD-10-CM | POA: Diagnosis not present

## 2020-05-11 ENCOUNTER — Other Ambulatory Visit: Payer: Self-pay | Admitting: Cardiovascular Disease

## 2020-05-11 DIAGNOSIS — E785 Hyperlipidemia, unspecified: Secondary | ICD-10-CM

## 2020-05-11 DIAGNOSIS — I2510 Atherosclerotic heart disease of native coronary artery without angina pectoris: Secondary | ICD-10-CM

## 2020-05-14 ENCOUNTER — Other Ambulatory Visit: Payer: Self-pay | Admitting: Cardiology

## 2020-06-02 ENCOUNTER — Other Ambulatory Visit: Payer: Self-pay

## 2020-06-02 DIAGNOSIS — I2510 Atherosclerotic heart disease of native coronary artery without angina pectoris: Secondary | ICD-10-CM

## 2020-06-02 DIAGNOSIS — E785 Hyperlipidemia, unspecified: Secondary | ICD-10-CM

## 2020-06-02 MED ORDER — CLOPIDOGREL BISULFATE 75 MG PO TABS: 75.0000 mg | ORAL_TABLET | Freq: Every day | ORAL | 0 refills | Status: DC

## 2020-06-24 NOTE — Progress Notes (Deleted)
Cardiology Office Note   Date:  06/24/2020   ID:  Michael, Spears 12/29/1955, MRN [id number]  PCP:  [name], MD  Cardiologist:  Dr. [name], MD   No chief complaint on file.     History of Present Illness: Michael Spears is a 64 y.o. male who presents for follow up, seen for Dr. [name].   [name] has a hx of HTN, HLD, CAD s/p CABG 2013 withLIMA to the LAD, SVG to OM1 and OM 2, SVG to PDA, NSTEMI 01/2012 with occluded SVG to OM with DES placement to the native LCx, PVCs/PACs and chronic angina who was recently hospitalized with COVID PNA treated with remdesivir, steroids and baricitinib.   He has a long hx of angina treated with Ranexa. He wore a Holter monitor in the past which showed PVC/PACs.  He underwent Lexiscan stress testing 12/2012 in the setting of fatigue with no ischemia.  Repeat NST in 2018 secondary to dyspnea and fatigue with no ischemia with normal LVEF per echocardiogram.  He was seen in follow-up 03/2019 with persistent complaints of chronic fatigue and history of snoring therefore sleep study was recommended however he could not afford it.  In last follow-up 10/10/2019, patient reported occasional angina while working in the yard if he stresses which would ease spontaneously without the use of SL NTG.  He was walking his dog daily for approximately 40 minutes without angina and therefore no further work-up was recommended at that time.  1.  CAD s/p CABG 2013: -s/p CABG with LIMA to LAD, SVG to OM1 and OM 2, SVG to PDA with recurrent NSTEMI 01/2012 showing occluded SVG to the OM with DES placement to the native LCx. -Echocardiogram 2018 with normal LVEF and diastolic dysfunction -Most recently had some intermittent angina however felt to be stable   2.  HTN: -Stable,   3.  HLD: -Last LDL,   Past Medical History:  Diagnosis Date  . Chest pain    Imdur added during admxn 7/13=> intol 2/2 HA (stopped): Norvasc added 8/13;  Ranexa 500 bid added 8/13 (QTc  stable) => CP much improved  . Chronic neck pain   . Coronary artery disease    s/p 4V CABG 12/10/11 (LIMA to LAD, sequential SVG to OM1 & OM2, SVG to PDA);  NSTEMI 01/13/12:  LHC 01/14/12: pLAD 40%, mLAD occluded, Dx 40%, pCFX 90%, OM1 small and occluded at the ostium, OM2 small with 99% stenosis, proximal and mid RCA 50%, mRCA occluded, S-OM1 and OM2 occluded, S-PDA okay, LIMA-LAD okay, EF 60%.  PCI: Promus DES to the pCFX    . Cough   . Fibromyalgia   . Headache(784.0)   . History of tobacco abuse    0.5 packs/day for 25 years, quit 2005  . HLD (hyperlipidemia)   . Hypertension   . Pericardial effusion    echo 12/29/11 small to moderate pericardial effusion primarily posteriorly and anterolaterally; echo 01/14/12: Mild LVH, EF 65%, moderate pericardial effusion circumferential to the heart with no evidence of Tamponade-similar to 12/29/11 ; trivial effusion by echo 7/13  . Pneumonia    HX OF pna    Past Surgical History:  Procedure Laterality Date  . APPENDECTOMY    . CORONARY ARTERY BYPASS GRAFT  12/10/2011   Procedure: CORONARY ARTERY BYPASS GRAFTING (CABG);  Surgeon: Delight Ovens, MD;  Location: [facility] OR;  Service: Open Heart Surgery;  Laterality: N/A;  Times four  using endoscopically harvested right greater saphenous and  left internal mammary artery.  [name] LEFT HEART CATHETERIZATION WITH CORONARY ANGIOGRAM N/A 12/07/2011   Procedure: LEFT HEART CATHETERIZATION WITH CORONARY ANGIOGRAM;  Surgeon: [name], MD;  Location: [facility] CATH LAB;  Service: Cardiovascular;  Laterality: N/A;  . LEFT HEART CATHETERIZATION WITH CORONARY ANGIOGRAM N/A 01/14/2012   Procedure: LEFT HEART CATHETERIZATION WITH CORONARY ANGIOGRAM;  Surgeon: [name], MD;  Location: [facility] CATH LAB;  Service: Cardiovascular;  Laterality: N/A;  . MOUTH SURGERY    . NASAL SINUS SURGERY    . PERCUTANEOUS CORONARY STENT INTERVENTION (PCI-S)  01/14/2012   Procedure: PERCUTANEOUS CORONARY STENT INTERVENTION (PCI-S);   Surgeon: [name], MD;  Location: [facility] CATH LAB;  Service: Cardiovascular;;     Current Outpatient Medications  Medication Sig Dispense Refill  . albuterol (VENTOLIN HFA) 108 (90 Base) MCG/ACT inhaler Inhale 2 puffs into the lungs every 6 (six) hours as needed for wheezing or shortness of breath. 6.7 g 0  . aspirin 81 MG EC tablet Take 1 tablet (81 mg total) by mouth daily.    [name] atorvastatin (LIPITOR) 80 MG tablet TAKE 1 TABLET BY MOUTH EVERY DAY (Patient taking differently: Take 80 mg by mouth daily. ) 90 tablet 2  . clopidogrel (PLAVIX) 75 MG tablet Take 1 tablet (75 mg total) by mouth daily. 30 tablet 0  . metoprolol tartrate (LOPRESSOR) 25 MG tablet TAKE 1 TABLET (25 MG TOTAL) BY MOUTH 2 (TWO) TIMES DAILY. 3 180 tablet 1  . nitroGLYCERIN (NITROSTAT) 0.4 MG SL tablet Place 1 tablet (0.4 mg total) under the tongue every 5 (five) minutes as needed for chest pain (up to 3 doses). 25 tablet 6  . pantoprazole (PROTONIX) 40 MG tablet Take 1 tablet (40 mg total) by mouth daily. 30 tablet 0  . predniSONE (DELTASONE) 10 MG tablet Take 40 mg daily for 1 day, 30 mg daily for 1 day, 20 mg daily for 1 days,10 mg daily for 1 day, then stop 10 tablet 0  . ranolazine (RANEXA) 500 MG 12 hr tablet TAKE 1 TABLET BY MOUTH TWICE A DAY 180 tablet 3   No current facility-administered medications for this visit.    Allergies:   Patient has no known allergies.    Social History:  The patient  reports that he quit smoking about 16 years ago. His smoking use included cigarettes. He has a 12.50 pack-year smoking history. He has never used smokeless tobacco. He reports that he does not drink alcohol and does not use drugs.   Family History:  The patient's ***family history includes Crohn's disease in his father; Hypertension in his father and mother.    ROS:  Please see the history of present illness.   Otherwise, review of systems are positive for {NONE DEFAULTED:18576::"none"}.   All other systems  are reviewed and negative.    PHYSICAL EXAM: VS:  There were no vitals taken for this visit. , BMI There is no height or weight on file to calculate BMI. GEN: Well nourished, well developed, in no acute distress HEENT: normal Neck: no JVD, carotid bruits, or masses Cardiac: ***RRR; no murmurs, rubs, or gallops,no edema  Respiratory:  clear to auscultation bilaterally, normal work of breathing GI: soft, nontender, nondistended, + BS MS: no deformity or atrophy Skin: warm and dry, no rash Neuro:  Strength and sensation are intact Psych: euthymic mood, full affect   EKG:  EKG {ACTION; IS/IS HQP:59163846} ordered today. The ekg ordered today demonstrates ***   Recent Labs: 03/22/2020: Magnesium 2.4  03/25/2020: ALT 48; BUN 27; Creatinine, Ser 0.96; Hemoglobin 13.3; Platelets 211; Potassium 5.2; Sodium 136    Lipid Panel    Component Value Date/Time   CHOL 123 10/10/2019 0945   TRIG 202 (H) 03/17/2020 1545   HDL 39 (L) 10/10/2019 0945   CHOLHDL 3.2 10/10/2019 0945   CHOLHDL 4 09/21/2013 0836   VLDL 39.2 09/21/2013 0836   LDLCALC 68 10/10/2019 0945      Wt Readings from Last 3 Encounters:  03/17/20 195 lb (88.5 kg)  10/10/19 198 lb 6.4 oz (90 kg)  03/28/19 193 lb (87.5 kg)      Other studies Reviewed: Additional studies/ records that were reviewed today include: ***. Review of the above records demonstrates: ***  Lexiscan stress test 12/08/2016:   Nuclear stress EF: 68%. The left ventricular ejection fraction is hyperdynamic (>65%).  The study is normal.  This is a low risk study.  Echocardiogram 12/08/2016:   - Left ventricle: The cavity size was normal. Systolic function was  normal. The estimated ejection fraction was in the range of 60%  to 65%. Wall motion was normal; there were no regional wall  motion abnormalities. Left ventricular diastolic function  parameters were normal. Doppler parameters are consistent with  indeterminate ventricular  filling pressure.  - Aortic valve: Transvalvular velocity was within the normal range.  There was no stenosis. There was no regurgitation.  - Mitral valve: Transvalvular velocity was within the normal range.  There was no evidence for stenosis. There was trivial  regurgitation.  - Right ventricle: The cavity size was normal. Wall thickness was  normal. Systolic function was normal.  - Tricuspid valve: There was trivial regurgitation.  - Pulmonary arteries: Systolic pressure was within the normal  range. PA peak pressure: 22 mm Hg (S).    ASSESSMENT AND PLAN:  1.  ***   Current medicines are reviewed at length with the patient today.  The patient {ACTIONS; HAS/DOES NOT HAVE:19233} concerns regarding medicines.  The following changes have been made:  {PLAN; NO CHANGE:13088:s}  Labs/ tests ordered today include: *** No orders of the defined types were placed in this encounter.    Disposition:   FU with *** in {gen number 1-77:939030} {Days to years:10300}  Signed, Georgie Chard, NP  06/24/2020 12:42 PM    [facility] [address] [place], [place], [place]  [address] Phone: [phone]; Fax: [phone]

## 2020-07-02 ENCOUNTER — Ambulatory Visit: Payer: BC Managed Care – PPO | Admitting: Cardiology

## 2020-08-07 ENCOUNTER — Encounter: Payer: Self-pay | Admitting: Physician Assistant

## 2020-08-07 NOTE — Progress Notes (Unsigned)
Cardiology Office Note    Date:  08/08/2020   ID:  Michael Spears [birth year], MRN [id number]  PCP:  Donald Prose, MD  Cardiologist:  Lauree Chandler, MD  Electrophysiologist:  None   Chief Complaint: f/u CAD  History of Present Illness:   Michael Spears is a 65 y.o. male with history of CAD s/p CABGx4 in 2013, chronic angina, dizziness, chronic fatigue (declined sleep study due to affordability), PACs, PVCs, chronic neck pain, fibromyalgia, tobacco abuse, prior pericardial effusion in 2013, chronic RBBB, acute respiratory failure with severe hypoxemia in 03/2020 due to Covid-19 requiring home O2 at discharge (unvaccinated). He had bypass in 12/2011 with LIMA to the LAD, sequential SVG to OM1 and OM 2, SVG to PDA. He subsequently re-presented in 01/2012 with NSTEMI with occluded SVG-OMs, s/p DES to native Cx. He has had angina over the years treated with antianginals (Imdur stopped due to headache, subsequent rx with Ranexa). Stress testing over the years showed no ischemia (last done 11/2016), EF 68%. 2D echo 11/2016 showed EF 60-65%, indeterminate filling pressure, no significant valvular disease. He also has a history of dizziness with remote Holter in the past with rare PVCs and PACs. Patient previously declined sleep study due to cost. HTN listed in chart but by my review had only had rare BP elevation, otherwise tending to run normal. Last admission noted in 03/2020 for Covid-19 and multifocal PNA requiring home O2 at discharge. MyChart messages reviewed demonstrate patient concern over persistent hypoxia following discharge, advised to f/u primary care.   He returns for follow-up today. He denies any recent angina - he has felt that Ranexa and his other regimen is working well for him. No bleeding reported. He does report that ever since his Covid infection his endurance remains down. He weaned off his O2 over 2 weeks' time this summer. He has noticed occasional coughing after exertion to the  point of having to throw up. He tracks his O2 and notices it still occasionally goes down to 92% with exertion. He states he saw his PCP for this. 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 He reports normal endurance prior to his Covid infection. No lower extremity edema, orthopnea, syncope. He still lacks full taste and smell as well.   Labwork independently reviewed: 03/2020 WBC 16.9, Hgb 13.3, K 5.2, Cr 0.96, albumin 2.5, ALT elevated 48, AST 21 (in setting of Covid), Mg 2.4 10/2019 LDL 68   Past Medical History:  Diagnosis Date  . Acute hypoxemic respiratory failure due to COVID-19 ([place]) 03/2020  . Chest pain    Imdur added during admxn 7/13=> intol 2/2 HA (stopped): Norvasc added 8/13;  Ranexa 500 bid added 8/13 (QTc stable) => CP much improved  . Chronic neck pain   . Coronary artery disease    s/p 4V CABG 12/10/11 (LIMA to LAD, sequential SVG to OM1 & OM2, SVG to PDA);  NSTEMI 01/13/12:  LHC 01/14/12: pLAD 40%, mLAD occluded, Dx 40%, pCFX 90%, OM1 small and occluded at the ostium, OM2 small with 99% stenosis, proximal and mid RCA 50%, mRCA occluded, S-OM1 and OM2 occluded, S-PDA okay, LIMA-LAD okay, EF 60%.  PCI: Promus DES to the pCFX    . Cough   . Fibromyalgia   . Headache(784.0)   . History of tobacco abuse    0.5 packs/day for 25 years, quit 2005  . HLD (hyperlipidemia)   . Hypertension   .  Pericardial effusion    echo 12/29/11 small to moderate pericardial effusion primarily posteriorly and anterolaterally; echo 01/14/12: Mild LVH, EF 65%, moderate pericardial effusion circumferential to the heart with no evidence of Tamponade-similar to 12/29/11 ; trivial effusion by echo 7/13  . Pneumonia    HX OF pna  . Tobacco abuse     Past Surgical History:  Procedure Laterality Date  . APPENDECTOMY    . CORONARY ARTERY BYPASS GRAFT  12/10/2011   Procedure: CORONARY ARTERY BYPASS GRAFTING (CABG);  Surgeon: Delight Ovens, MD;  Location: [facility] OR;  Service: Open Heart Surgery;  Laterality: N/A;  Times four  using endoscopically harvested right greater saphenous and left internal mammary artery.  [name] LEFT HEART CATHETERIZATION WITH CORONARY ANGIOGRAM N/A 12/07/2011   Procedure: LEFT HEART CATHETERIZATION WITH CORONARY ANGIOGRAM;  Surgeon: [name], MD;  Location: [facility] CATH LAB;  Service: Cardiovascular;  Laterality: N/A;  . LEFT HEART CATHETERIZATION WITH CORONARY ANGIOGRAM N/A 01/14/2012   Procedure: LEFT HEART CATHETERIZATION WITH CORONARY ANGIOGRAM;  Surgeon: [name], MD;  Location: [facility] CATH LAB;  Service: Cardiovascular;  Laterality: N/A;  . MOUTH SURGERY    . NASAL SINUS SURGERY    . PERCUTANEOUS CORONARY STENT INTERVENTION (PCI-S)  01/14/2012   Procedure: PERCUTANEOUS CORONARY STENT INTERVENTION (PCI-S);  Surgeon: [name], MD;  Location: [facility] C[facility] LAB;  Service: Cardiovascular;;    Current Medications: Current Meds  Medication Sig  . albuterol (VENTOLIN HFA) 108 (90 Base) MCG/ACT inhaler Inhale 2 puffs into the lungs every 6 (six) hours as needed for wheezing or shortness of breath.  [name] aspirin 81 MG EC tablet Take 1 tablet (81 mg total) by mouth daily.  [name] atorvastatin (LIPITOR) 80 MG tablet TAKE 1 TABLET BY MOUTH EVERY DAY (Patient taking differently: Take 80 mg by mouth daily.)  . clopidogrel (PLAVIX) 75 MG tablet Take 1 tablet (75 mg total) by mouth daily.  . metoprolol tartrate (LOPRESSOR) 25 MG tablet TAKE 1 TABLET (25 MG TOTAL) BY MOUTH 2 (TWO) TIMES DAILY. 3  . ranolazine (RANEXA) 500 MG 12 hr tablet TAKE 1 TABLET BY MOUTH TWICE A DAY  . [DISCONTINUED] pantoprazole (PROTONIX) 40 MG tablet Take 1 tablet (40 mg total) by mouth daily.  . [DISCONTINUED] predniSONE (DELTASONE) 10 MG tablet Take 40 mg daily for 1 day, 30 mg daily for 1 day, 20 mg daily for 1 days,10 mg daily for 1 day, then stop     Allergies:   Patient has no known allergies.   Social History    Socioeconomic History  . Marital status: Divorced    Spouse name: Not on file  . Number of children: Not on file  . Years of education: Not on file  . Highest education level: Not on file  Occupational History  . Not on file  Tobacco Use  . Smoking status: Former Smoker    Packs/day: 0.50    Years: 25.00    Pack years: 12.50    Types: Cigarettes    Quit date: 12/07/2003    Years since quitting: 16.6  . Smokeless tobacco: Never Used  Substance and Sexual Activity  . Alcohol use: No  . Drug use: No  . Sexual activity: Yes  Other Topics Concern  . Not on file  Social History Narrative  . Not on file   Social Determinants of Health   Financial Resource Strain: Not on file  Food Insecurity: Not on file  Transportation Needs: Not on file  Physical  Activity: Not on file  Stress: Not on file  Social Connections: Not on file     Family History:  The patient's family history includes Crohn's disease in his father; Hypertension in his father and mother.  ROS:   Please see the history of present illness. All other systems are reviewed and otherwise negative.    EKGs/Labs/Other Studies Reviewed:    Studies reviewed are outlined and summarized above. Reports included below if pertinent.  NST 11/2016  Nuclear stress EF: 68%. The left ventricular ejection fraction is hyperdynamic (>65%).  The study is normal.  This is a low risk study.   2D Echo 2018 - Left ventricle: The cavity size was normal. Systolic function was  normal. The estimated ejection fraction was in the range of 60%  to 65%. Wall motion was normal; there were no regional wall  motion abnormalities. Left ventricular diastolic function  parameters were normal. Doppler parameters are consistent with  indeterminate ventricular filling pressure.  - Aortic valve: Transvalvular velocity was within the normal range.  There was no stenosis. There was no regurgitation.  - Mitral valve: Transvalvular  velocity was within the normal range.  There was no evidence for stenosis. There was trivial  regurgitation.  - Right ventricle: The cavity size was normal. Wall thickness was  normal. Systolic function was normal.  - Tricuspid valve: There was trivial regurgitation.  - Pulmonary arteries: Systolic pressure was within the normal  range. PA peak pressure: 22 mm Hg (S).      EKG:  EKG is ordered today, personally reviewed, demonstrating NSR 72bpm, known RBBB, no acute changes, QTc  Recent Labs: 03/22/2020: Magnesium 2.4 03/25/2020: ALT 48; BUN 27; Creatinine, Ser 0.96; Hemoglobin 13.3; Platelets 211; Potassium 5.2; Sodium 136  Recent Lipid Panel    Component Value Date/Time   CHOL 123 10/10/2019 0945   TRIG 202 (H) 03/17/2020 1545   HDL 39 (L) 10/10/2019 0945   CHOLHDL 3.2 10/10/2019 0945   CHOLHDL 4 09/21/2013 0836   VLDL 39.2 09/21/2013 0836   LDLCALC 68 10/10/2019 0945    PHYSICAL EXAM:    VS:  BP 118/78   Pulse 72   Ht 5\' 10"  (1.778 m)   Wt 196 lb 12.8 oz (89.3 kg)   SpO2 96%   BMI 28.24 kg/m   BMI: Body mass index is 28.24 kg/m.  GEN: Well nourished, well developed WM, in no acute distress HEENT: normocephalic, atraumatic Neck: no JVD, carotid bruits, or masses Cardiac: RRR; no murmurs, rubs, or gallops, no edema  Respiratory:  Moderately diminished throughout without wheezing, rales or rhonchi, normal work of breathing GI: soft, nontender, nondistended, + BS MS: no deformity or atrophy Skin: warm and dry, no rash Neuro:  Alert and Oriented x 3, Strength and sensation are intact, follows commands Psych: euthymic mood, full affect  Wt Readings from Last 3 Encounters:  08/08/20 196 lb 12.8 oz (89.3 kg)  03/17/20 195 lb (88.5 kg)  10/10/19 198 lb 6.4 oz (90 kg)     ASSESSMENT & PLAN:   1. CAD s/p prior CABG with chronic stable angina - clinically appears stable from a cardiac standpoint. No recent angina on his current regimen. He has had issues  with post-Covid dyspnea and O2 sat maintenance. Continue ASA, BB, Ranexa, statin, Plavix (has been maintained on long term DAPT by Dr. 12/10/19). QTC stable on Ranexa. Will update basic labs today to include CMET, CBC, lipid profile (last ate 9am). Check echo as below.  2.  History of Covid-19 infection with residual shortness of breath - he had a severe course of Covid in August 2021 requiring initiation of home O2. His resting hypoxia resolved and he was able to wean off O2, with improvement but still reports residual "long hauler" type symptoms. 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 His EKG is stable and he has not had any angina. Will obtain 2D echocardiogram to evaluate his LV function and refer to post-Covid care center for their input. Check BNP with labs, although he does not appear overtly volume up otherwise. He has no symptoms to suggest progressive angina. No other high risk sx to suggest acute VTE. We discussed Covid-19 vaccination today and his hesitancy towards this which stems from his concern over vaccine-induced myocarditis. I feel he is at high risk for complications from recurrent Covid-19 if he remains unvaccinated and have recommended he consider getting immunized.  3. PACs, PVCs - generally quiescent on beta blocker. Continue to monitor clinically. EKG NSR.  4. Hyperlipidemia - check CMET/lipid profile today.  Disposition: F/u with Dr. [name] in 1 year, sooner if symptoms do not improve with post-Covid care or echo shows any functional abnormalities.  Medication Adjustments/Labs and Tests Ordered: Current medicines are reviewed at length with the patient today.  Concerns regarding medicines are outlined above. Medication changes, Labs and Tests ordered today are summarized above and listed in the Patient Instructions accessible in Encounters.   Signed, Laurann Montana, PA-C  08/08/2020 2:01 PM    [facility] [address] [place],  [place], [place]  [address] Phone: [phone]; Fax: [phone]

## 2020-08-08 ENCOUNTER — Encounter: Payer: Self-pay | Admitting: Physician Assistant

## 2020-08-08 ENCOUNTER — Ambulatory Visit (INDEPENDENT_AMBULATORY_CARE_PROVIDER_SITE_OTHER): Payer: BC Managed Care – PPO | Admitting: Physician Assistant

## 2020-08-08 ENCOUNTER — Other Ambulatory Visit: Payer: Self-pay

## 2020-08-08 VITALS — BP 118/78 | HR 72 | Ht 70.0 in | Wt 196.8 lb

## 2020-08-08 DIAGNOSIS — I25708 Atherosclerosis of coronary artery bypass graft(s), unspecified, with other forms of angina pectoris: Secondary | ICD-10-CM

## 2020-08-08 DIAGNOSIS — I491 Atrial premature depolarization: Secondary | ICD-10-CM

## 2020-08-08 DIAGNOSIS — R0602 Shortness of breath: Secondary | ICD-10-CM

## 2020-08-08 DIAGNOSIS — I493 Ventricular premature depolarization: Secondary | ICD-10-CM | POA: Diagnosis not present

## 2020-08-08 DIAGNOSIS — I25810 Atherosclerosis of coronary artery bypass graft(s) without angina pectoris: Secondary | ICD-10-CM

## 2020-08-08 DIAGNOSIS — Z8616 Personal history of COVID-19: Secondary | ICD-10-CM | POA: Diagnosis not present

## 2020-08-08 DIAGNOSIS — E785 Hyperlipidemia, unspecified: Secondary | ICD-10-CM | POA: Diagnosis not present

## 2020-08-08 MED ORDER — METOPROLOL TARTRATE 25 MG PO TABS: 25.0000 mg | ORAL_TABLET | Freq: Two times a day (BID) | ORAL | 1 refills | Status: DC

## 2020-08-08 MED ORDER — CLOPIDOGREL BISULFATE 75 MG PO TABS: 75.0000 mg | ORAL_TABLET | Freq: Every day | ORAL | 3 refills | Status: DC

## 2020-08-08 MED ORDER — NITROGLYCERIN 0.4 MG SL SUBL: 0.4000 mg | SUBLINGUAL_TABLET | SUBLINGUAL | 6 refills | Status: DC | PRN

## 2020-08-08 MED ORDER — ATORVASTATIN CALCIUM 80 MG PO TABS: 80.0000 mg | ORAL_TABLET | Freq: Every day | ORAL | 3 refills | Status: DC

## 2020-08-08 MED ORDER — RANOLAZINE ER 500 MG PO TB12: 500.0000 mg | ORAL_TABLET | Freq: Two times a day (BID) | ORAL | 3 refills | Status: DC

## 2020-08-08 NOTE — Patient Instructions (Addendum)
Medication Instructions:  Your physician recommends that you continue on your current medications as directed. Please refer to the Current Medication list given to you today.  *If you need a refill on your cardiac medications before your next appointment, please call your pharmacy*   Lab Work: TODAY:  CMET, LIPID, PRO BNP, & CBC  If you have labs (blood work) drawn today and your tests are completely normal, you will receive your results only by: [name] MyChart Message (if you have MyChart) OR . A paper copy in the mail If you have any lab test that is abnormal or we need to change your treatment, we will call you to review the results.   Testing/Procedures: Your physician has requested that you have an echocardiogram. Echocardiography is a painless test that uses sound waves to create images of your heart. It provides your doctor with information about the size and shape of your heart and how well your heart's chambers and valves are working. This procedure takes approximately one hour. There are no restrictions for this procedure.     Follow-Up: At [facility], you and your health needs are our priority.  As part of our continuing mission to provide you with exceptional heart care, we have created designated Provider Care Teams.  These Care Teams include your primary Cardiologist (physician) and Advanced Practice Providers (APPs -  Physician Assistants and Nurse Practitioners) who all work together to provide you with the care you need, when you need it.  We recommend signing up for the patient portal called "MyChart".  Sign up information is provided on this After Visit Summary.  MyChart is used to connect with patients for Virtual Visits (Telemedicine).  Patients are able to view lab/test results, encounter notes, upcoming appointments, etc.  Non-urgent messages can be sent to your provider as well.   To learn more about what you can do with MyChart, go to [email or web address].     Your next appointment:   12 month(s)  The format for your next appointment:   In Person  Provider:   You may see Verne Carrow, MD or one of the following Advanced Practice Providers on your designated Care Team:    Ronie Spies, PA-C  Jacolyn Reedy, PA-C    Other Instructions Call the post covid care clinic, [phone] for an appointent    Echocardiogram An echocardiogram is a procedure that uses painless sound waves (ultrasound) to produce an image of the heart. Images from an echocardiogram can provide important information about:  Signs of coronary artery disease (CAD).  Aneurysm detection. An aneurysm is a weak or damaged part of an artery wall that bulges out from the normal force of blood pumping through the body.  Heart size and shape. Changes in the size or shape of the heart can be associated with certain conditions, including heart failure, aneurysm, and CAD.  Heart muscle function.  Heart valve function.  Signs of a past heart attack.  Fluid buildup around the heart.  Thickening of the heart muscle.  A tumor or infectious growth around the heart valves. Tell a health care provider about:  Any allergies you have.  All medicines you are taking, including vitamins, herbs, eye drops, creams, and over-the-counter medicines.  Any blood disorders you have.  Any surgeries you have had.  Any medical conditions you have.  Whether you are pregnant or may be pregnant. What are the risks? Generally, this is a safe procedure. However, problems may occur, including:  Allergic reaction to dye (  contrast) that may be used during the procedure. What happens before the procedure? No specific preparation is needed. You may eat and drink normally. What happens during the procedure?   An IV tube may be inserted into one of your veins.  You may receive contrast through this tube. A contrast is an injection that improves the quality of the pictures from  your heart.  A gel will be applied to your chest.  A wand-like tool (transducer) will be moved over your chest. The gel will help to transmit the sound waves from the transducer.  The sound waves will harmlessly bounce off of your heart to allow the heart images to be captured in real-time motion. The images will be recorded on a computer. The procedure may vary among health care providers and hospitals. What happens after the procedure?  You may return to your normal, everyday life, including diet, activities, and medicines, unless your health care provider tells you not to do that. Summary  An echocardiogram is a procedure that uses painless sound waves (ultrasound) to produce an image of the heart.  Images from an echocardiogram can provide important information about the size and shape of your heart, heart muscle function, heart valve function, and fluid buildup around your heart.  You do not need to do anything to prepare before this procedure. You may eat and drink normally.  After the echocardiogram is completed, you may return to your normal, everyday life, unless your health care provider tells you not to do that. This information is not intended to replace advice given to you by your health care provider. Make sure you discuss any questions you have with your health care provider. Document Revised: 11/09/2018 Document Reviewed: 08/21/2016 Elsevier Patient Education  [employer].

## 2020-08-09 LAB — CBC
Hematocrit: 46 % (ref 37.5–51.0)
Hemoglobin: 16.2 g/dL (ref 13.0–17.7)
MCH: 29.7 pg (ref 26.6–33.0)
MCHC: 35.2 g/dL (ref 31.5–35.7)
MCV: 84 fL (ref 79–97)
Platelets: 182 10*3/uL (ref 150–450)
RBC: 5.46 x10E6/uL (ref 4.14–5.80)
RDW: 12.5 % (ref 11.6–15.4)
WBC: 8.1 10*3/uL (ref 3.4–10.8)

## 2020-08-09 LAB — COMPREHENSIVE METABOLIC PANEL
ALT: 18 IU/L (ref 0–44)
AST: 19 IU/L (ref 0–40)
Albumin/Globulin Ratio: 3.2 — ABNORMAL HIGH (ref 1.2–2.2)
Albumin: 4.8 g/dL (ref 3.8–4.8)
Alkaline Phosphatase: 99 IU/L (ref 44–121)
BUN/Creatinine Ratio: 15 (ref 10–24)
BUN: 21 mg/dL (ref 8–27)
Bilirubin Total: 0.5 mg/dL (ref 0.0–1.2)
CO2: 26 mmol/L (ref 20–29)
Calcium: 10.4 mg/dL — ABNORMAL HIGH (ref 8.6–10.2)
Chloride: 104 mmol/L (ref 96–106)
Creatinine, Ser: 1.38 mg/dL — ABNORMAL HIGH (ref 0.76–1.27)
GFR calc Af Amer: 62 mL/min/{1.73_m2} (ref >59)
GFR calc non Af Amer: 53 mL/min/{1.73_m2} — ABNORMAL LOW (ref >59)
Globulin, Total: 1.5 g/dL (ref 1.5–4.5)
Glucose: 101 mg/dL — ABNORMAL HIGH (ref 65–99)
Potassium: 4.5 mmol/L (ref 3.5–5.2)
Sodium: 142 mmol/L (ref 134–144)
Total Protein: 6.3 g/dL (ref 6.0–8.5)

## 2020-08-09 LAB — LIPID PANEL
Chol/HDL Ratio: 3.2 ratio (ref 0.0–5.0)
Cholesterol, Total: 125 mg/dL (ref 100–199)
HDL: 39 mg/dL — ABNORMAL LOW (ref >39)
LDL Chol Calc (NIH): 65 mg/dL (ref 0–99)
Triglycerides: 117 mg/dL (ref 0–149)
VLDL Cholesterol Cal: 21 mg/dL (ref 5–40)

## 2020-08-09 LAB — PRO B NATRIURETIC PEPTIDE: NT-Pro BNP: 25 pg/mL (ref 0–376)

## 2020-08-29 ENCOUNTER — Other Ambulatory Visit (HOSPITAL_COMMUNITY): Payer: BC Managed Care – PPO

## 2020-09-19 ENCOUNTER — Ambulatory Visit
Admission: RE | Admit: 2020-09-19 | Discharge: 2020-09-19 | Disposition: A | Discharge status: Home / Self Care | Payer: BC Managed Care – PPO | Source: Ambulatory Visit | Attending: Nurse Practitioner | Admitting: Nurse Practitioner

## 2020-09-19 ENCOUNTER — Other Ambulatory Visit: Payer: Self-pay

## 2020-09-19 ENCOUNTER — Ambulatory Visit (INDEPENDENT_AMBULATORY_CARE_PROVIDER_SITE_OTHER): Payer: Self-pay | Admitting: Nurse Practitioner

## 2020-09-19 VITALS — BP 148/79 | HR 84 | Temp 97.1°F | Resp 20 | Wt 193.0 lb

## 2020-09-19 DIAGNOSIS — R9389 Abnormal findings on diagnostic imaging of other specified body structures: Secondary | ICD-10-CM

## 2020-09-19 DIAGNOSIS — R439 Unspecified disturbances of smell and taste: Secondary | ICD-10-CM

## 2020-09-19 DIAGNOSIS — Z8616 Personal history of COVID-19: Secondary | ICD-10-CM

## 2020-09-19 DIAGNOSIS — J984 Other disorders of lung: Secondary | ICD-10-CM | POA: Diagnosis not present

## 2020-09-19 DIAGNOSIS — R0609 Other forms of dyspnea: Secondary | ICD-10-CM | POA: Diagnosis not present

## 2020-09-19 DIAGNOSIS — R918 Other nonspecific abnormal finding of lung field: Secondary | ICD-10-CM | POA: Diagnosis not present

## 2020-09-19 DIAGNOSIS — U099 Post covid-19 condition, unspecified: Secondary | ICD-10-CM

## 2020-09-19 NOTE — Progress Notes (Signed)
@Patient  ID: , male    DOB: 1955/10/13, 65 y.o.   MRN: [id number]  Chief Complaint  Patient presents with  . covid    Smells that cause nausea SOB with exertion Fatigue Cough with occasional emesis episode     Referring provider: [id number], MD  HPI  Patient presents today for post COVID care clinic visit.  Patient tested positive for Covid in August 2021.  Since that time he has been having ongoing issues with shortness of breath with exertion, altered taste and smell, fatigue, and cough.  Patient did have chest x-ray in September 2021 that was concerning at that time for the appearance of developing post COVID fibrosis.  Patient states that he was not aware of this.  Patient was walked in office today O2 sats remained stable and heart rate was stable for entire walk.  Patient has been evaluated by cardiology.  Denies f/c/s, n/v/d, hemoptysis, PND, chest pain or edema.      No Known Allergies   There is no immunization history on file for this patient.  Past Medical History:  Diagnosis Date  . Acute hypoxemic respiratory failure due to COVID-19 (HCC) 03/2020  . Chest pain    Imdur added during admxn 7/13=> intol 2/2 HA (stopped): Norvasc added 8/13;  Ranexa 500 bid added 8/13 (QTc stable) => CP much improved  . Chronic neck pain   . Coronary artery disease    s/p 4V CABG 12/10/11 (LIMA to LAD, sequential SVG to OM1 & OM2, SVG to PDA);  NSTEMI 01/13/12:  LHC 01/14/12: pLAD 40%, mLAD occluded, Dx 40%, pCFX 90%, OM1 small and occluded at the ostium, OM2 small with 99% stenosis, proximal and mid RCA 50%, mRCA occluded, S-OM1 and OM2 occluded, S-PDA okay, LIMA-LAD okay, EF 60%.  PCI: Promus DES to the pCFX    . Cough   . Fibromyalgia   . Headache(784.0)   . History of tobacco abuse    0.5 packs/day for 25 years, quit 2005  . HLD (hyperlipidemia)   . Hypertension   . Pericardial effusion    echo 12/29/11 small to moderate pericardial effusion primarily posteriorly  and anterolaterally; echo 01/14/12: Mild LVH, EF 65%, moderate pericardial effusion circumferential to the heart with no evidence of Tamponade-similar to 12/29/11 ; trivial effusion by echo 7/13  . Pneumonia    HX OF pna  . Tobacco abuse     Tobacco History: Social History   Tobacco Use  Smoking Status Former Smoker  . Packs/day: 0.50  . Years: 25.00  . Pack years: 12.50  . Types: Cigarettes  . Quit date: 12/07/2003  . Years since quitting: 16.8  Smokeless Tobacco Never Used   Counseling given: Not Answered   Outpatient Encounter Medications as of 09/19/2020  Medication Sig  . albuterol (VENTOLIN HFA) 108 (90 Base) MCG/ACT inhaler Inhale 2 puffs into the lungs every 6 (six) hours as needed for wheezing or shortness of breath.  09/21/2020 atorvastatin (LIPITOR) 80 MG tablet Take 1 tablet (80 mg total) by mouth daily.  . clopidogrel (PLAVIX) 75 MG tablet Take 1 tablet (75 mg total) by mouth daily.  . metoprolol tartrate (LOPRESSOR) 25 MG tablet Take 1 tablet (25 mg total) by mouth 2 (two) times daily. 3  . nitroGLYCERIN (NITROSTAT) 0.4 MG SL tablet Place 1 tablet (0.4 mg total) under the tongue every 5 (five) minutes as needed for chest pain (up to 3 doses).  . ranolazine (RANEXA) 500 MG 12 hr tablet Take  1 tablet (500 mg total) by mouth 2 (two) times daily.  [name] aspirin 81 MG EC tablet Take 1 tablet (81 mg total) by mouth daily. (Patient not taking: Reported on 09/19/2020)   No facility-administered encounter medications on file as of 09/19/2020.     Review of Systems  Review of Systems  Constitutional: Positive for fatigue. Negative for fever.  HENT: Negative.   Respiratory: Positive for cough and shortness of breath.   Cardiovascular: Negative.  Negative for chest pain, palpitations and leg swelling.  Gastrointestinal: Negative.   Allergic/Immunologic: Negative.   Neurological: Negative.        Altered taste and smell  Psychiatric/Behavioral: Negative.        Physical Exam  BP  (!) 148/79   Pulse 84   Temp (!) 97.1 F (36.2 C)   Resp 20   Wt 193 lb (87.5 kg)   SpO2 96%   BMI 27.69 kg/m   Wt Readings from Last 5 Encounters:  09/19/20 193 lb (87.5 kg)  08/08/20 196 lb 12.8 oz (89.3 kg)  03/17/20 195 lb (88.5 kg)  10/10/19 198 lb 6.4 oz (90 kg)  03/28/19 193 lb (87.5 kg)     Physical Exam Vitals and nursing note reviewed.  Constitutional:      General: He is not in acute distress.    Appearance: He is well-developed and well-nourished.  Cardiovascular:     Rate and Rhythm: Normal rate and regular rhythm.  Pulmonary:     Effort: Pulmonary effort is normal.     Breath sounds: Normal breath sounds.  Musculoskeletal:     Right lower leg: No edema.     Left lower leg: No edema.  Skin:    General: Skin is warm and dry.  Neurological:     Mental Status: He is alert and oriented to person, place, and time.  Psychiatric:        Mood and Affect: Mood and affect and mood normal.        Behavior: Behavior normal.      Lab Results:  CBC    Component Value Date/Time   WBC 8.1 08/08/2020 1440   WBC 16.9 (H) 03/25/2020 0602   RBC 5.46 08/08/2020 1440   RBC 4.46 03/25/2020 0602   HGB 16.2 08/08/2020 1440   HCT 46.0 08/08/2020 1440   PLT 182 08/08/2020 1440   MCV 84 08/08/2020 1440   MCH 29.7 08/08/2020 1440   MCH 29.8 03/25/2020 0602   MCHC 35.2 08/08/2020 1440   MCHC 33.1 03/25/2020 0602   RDW 12.5 08/08/2020 1440   LYMPHSABS 0.3 (L) 03/22/2020 0658   MONOABS 0.8 03/22/2020 0658   EOSABS 0.0 03/22/2020 0658   BASOSABS 0.0 03/22/2020 0658    BMET    Component Value Date/Time   NA 142 08/08/2020 1440   K 4.5 08/08/2020 1440   CL 104 08/08/2020 1440   CO2 26 08/08/2020 1440   GLUCOSE 101 (H) 08/08/2020 1440   GLUCOSE 123 (H) 03/25/2020 0602   BUN 21 08/08/2020 1440   CREATININE 1.38 (H) 08/08/2020 1440   CALCIUM 10.4 (H) 08/08/2020 1440   GFRNONAA 53 (L) 08/08/2020 1440   GFRAA 62 08/08/2020 1440    BNP No results found for:  BNP  ProBNP    Component Value Date/Time   PROBNP 25 08/08/2020 1440   PROBNP 249.8 (H) 12/29/2011 0008    Imaging: DG Chest 2 View  Result Date: 09/19/2020 CLINICAL DATA:  COVID-19 long-hauler, chronic dyspnea, follow-up EXAM: CHEST -  2 VIEW COMPARISON:  04/04/2020 FINDINGS: Normal heart size post CABG. Mediastinal contours and pulmonary vascularity normal. Resolution of pulmonary infiltrates seen on previous exam. Minimal residual volume loss/scarring in RIGHT upper lobe and lateral mid LEFT chest No new infiltrate, pleural effusion, or pneumothorax. Osseous structures unremarkable. IMPRESSION: Resolution of previously identified pulmonary infiltrates. Minimal volume loss/scarring in RIGHT upper lobe and lateral mid LEFT chest. Electronically Signed   By: Ulyses Southward M.D.   On: 09/19/2020 19:07     Assessment & Plan:   History of COVID-19 Cough Fatigue Shortness of breath Altered taste and smell:   Stay well hydrated  Stay active  Deep breathing exercises  May take tylenol for fever or pain    Will order chest x ray:  [facility] [address] [place], [place] [address] [phone] MON - FRI 8:00 AM - 4:00 PM - WALK IN   Will place referral to neurology for altered taste and smell  May trial olfactory re-training using essential oils    Follow up:  Follow up in 2 weeks or sooner if needed      Ivonne Andrew, NP 09/22/2020

## 2020-09-19 NOTE — Patient Instructions (Addendum)
Covid 19 Cough Fatigue Shortness of breath Altered taste and smell:   Stay well hydrated  Stay active  Deep breathing exercises  May take tylenol for fever or pain    Will order chest x ray:  [facility] [address] [place], [place] [address] [phone] MON - FRI 8:00 AM - 4:00 PM - WALK IN   Will place referral to neurology for altered taste and smell  May trial olfactory re-training using essential oils    Follow up:  Follow up in 2 weeks or sooner if needed

## 2020-09-22 DIAGNOSIS — R438 Other disturbances of smell and taste: Secondary | ICD-10-CM | POA: Insufficient documentation

## 2020-09-22 DIAGNOSIS — R9389 Abnormal findings on diagnostic imaging of other specified body structures: Secondary | ICD-10-CM | POA: Insufficient documentation

## 2020-09-22 DIAGNOSIS — Z8616 Personal history of COVID-19: Secondary | ICD-10-CM | POA: Insufficient documentation

## 2020-09-22 DIAGNOSIS — U099 Post covid-19 condition, unspecified: Secondary | ICD-10-CM | POA: Insufficient documentation

## 2020-09-22 DIAGNOSIS — R439 Unspecified disturbances of smell and taste: Secondary | ICD-10-CM | POA: Insufficient documentation

## 2020-09-22 DIAGNOSIS — R0609 Other forms of dyspnea: Secondary | ICD-10-CM | POA: Insufficient documentation

## 2020-09-22 NOTE — Assessment & Plan Note (Signed)
Cough Fatigue Shortness of breath Altered taste and smell:   Stay well hydrated  Stay active  Deep breathing exercises  May take tylenol for fever or pain    Will order chest x ray:  [facility] [address] [place], [place] [address] [phone] MON - FRI 8:00 AM - 4:00 PM - WALK IN   Will place referral to neurology for altered taste and smell  May trial olfactory re-training using essential oils    Follow up:  Follow up in 2 weeks or sooner if needed

## 2020-09-26 ENCOUNTER — Other Ambulatory Visit (HOSPITAL_COMMUNITY): Payer: BC Managed Care – PPO

## 2020-09-30 ENCOUNTER — Telehealth (HOSPITAL_COMMUNITY): Payer: Self-pay | Admitting: Physician Assistant

## 2020-09-30 NOTE — Telephone Encounter (Signed)
Patient cancelled echocardiogram x 2 and state that he s unable to afford and did not reschedule per Elenor Quinones ( Patient engagement) .Order will be removed from the WQ.

## 2020-10-03 ENCOUNTER — Other Ambulatory Visit (HOSPITAL_COMMUNITY): Payer: BC Managed Care – PPO

## 2020-11-30 DIAGNOSIS — J069 Acute upper respiratory infection, unspecified: Secondary | ICD-10-CM | POA: Diagnosis not present

## 2020-11-30 DIAGNOSIS — Z03818 Encounter for observation for suspected exposure to other biological agents ruled out: Secondary | ICD-10-CM | POA: Diagnosis not present

## 2020-12-22 DIAGNOSIS — U071 COVID-19: Secondary | ICD-10-CM | POA: Diagnosis not present

## 2020-12-23 ENCOUNTER — Telehealth: Payer: Self-pay

## 2020-12-23 ENCOUNTER — Encounter: Payer: Self-pay | Admitting: Adult Health

## 2020-12-23 ENCOUNTER — Other Ambulatory Visit: Payer: Self-pay | Admitting: Adult Health

## 2020-12-23 ENCOUNTER — Other Ambulatory Visit (HOSPITAL_COMMUNITY): Payer: Self-pay

## 2020-12-23 DIAGNOSIS — U071 COVID-19: Secondary | ICD-10-CM

## 2020-12-23 MED ORDER — MOLNUPIRAVIR EUA 200MG CAPSULE
4.0000 | ORAL_CAPSULE | Freq: Two times a day (BID) | ORAL | 0 refills | Status: AC
  Filled 2020-12-23: qty 40, 5d supply, fill #0

## 2020-12-23 NOTE — Progress Notes (Signed)
Outpatient Oral COVID Treatment Note  I connected with Michael Spears on 12/23/2020/9:23 AM by telephone and verified that I am speaking with the correct person using two identifiers.  I discussed the limitations, risks, security, and privacy concerns of performing an evaluation and management service by telephone and the availability of in person appointments. I also discussed with the patient that there may be a patient responsible charge related to this service. The patient expressed understanding and agreed to proceed.  Patient location: home Provider location: home  Diagnosis: COVID-19 infection  Purpose of visit: Discussion of potential use of Molnupiravir or Paxlovid, a new treatment for mild to moderate COVID-19 viral infection in non-hospitalized patients.   Subjective: Patient is a 65 y.o. male who has been diagnosed with COVID 19 viral infection.  Their symptoms began on 5/22 with cough/congestion.    Past Medical History:  Diagnosis Date  . Acute hypoxemic respiratory failure due to COVID-19 (HCC) 03/2020  . Chest pain    Imdur added during admxn 7/13=> intol 2/2 HA (stopped): Norvasc added 8/13;  Ranexa 500 bid added 8/13 (QTc stable) => CP much improved  . Chronic neck pain   . Coronary artery disease    s/p 4V CABG 12/10/11 (LIMA to LAD, sequential SVG to OM1 & OM2, SVG to PDA);  NSTEMI 01/13/12:  LHC 01/14/12: pLAD 40%, mLAD occluded, Dx 40%, pCFX 90%, OM1 small and occluded at the ostium, OM2 small with 99% stenosis, proximal and mid RCA 50%, mRCA occluded, S-OM1 and OM2 occluded, S-PDA okay, LIMA-LAD okay, EF 60%.  PCI: Promus DES to the pCFX    . Cough   . Fibromyalgia   . Headache(784.0)   . History of tobacco abuse    0.5 packs/day for 25 years, quit 2005  . HLD (hyperlipidemia)   . Hypertension   . Pericardial effusion    echo 12/29/11 small to moderate pericardial effusion primarily posteriorly and anterolaterally; echo 01/14/12: Mild LVH, EF 65%, moderate pericardial  effusion circumferential to the heart with no evidence of Tamponade-similar to 12/29/11 ; trivial effusion by echo 7/13  . Pneumonia    HX OF pna  . Tobacco abuse     No Known Allergies   Current Outpatient Medications:  .  albuterol (VENTOLIN HFA) 108 (90 Base) MCG/ACT inhaler, Inhale 2 puffs into the lungs every 6 (six) hours as needed for wheezing or shortness of breath., Disp: 6.7 g, Rfl: 0 .  aspirin 81 MG EC tablet, Take 1 tablet (81 mg total) by mouth daily. (Patient not taking: Reported on 09/19/2020), Disp: , Rfl:  .  atorvastatin (LIPITOR) 80 MG tablet, Take 1 tablet (80 mg total) by mouth daily., Disp: 90 tablet, Rfl: 3 .  clopidogrel (PLAVIX) 75 MG tablet, Take 1 tablet (75 mg total) by mouth daily., Disp: 90 tablet, Rfl: 3 .  metoprolol tartrate (LOPRESSOR) 25 MG tablet, Take 1 tablet (25 mg total) by mouth 2 (two) times daily. 3, Disp: 180 tablet, Rfl: 1 .  nitroGLYCERIN (NITROSTAT) 0.4 MG SL tablet, Place 1 tablet (0.4 mg total) under the tongue every 5 (five) minutes as needed for chest pain (up to 3 doses)., Disp: 25 tablet, Rfl: 6 .  ranolazine (RANEXA) 500 MG 12 hr tablet, Take 1 tablet (500 mg total) by mouth 2 (two) times daily., Disp: 180 tablet, Rfl: 3  Objective: Patient appears/sounds hoarse, but no acute distress.  They are in no apparent distress.  Breathing is non labored.  Mood and behavior are normal.  Laboratory Data:  No results found for this or any previous visit (from the past 2160 hour(s)).   Assessment: 65 y.o. male with mild/moderate COVID 19 viral infection diagnosed on 12/21/2020 at high risk for progression to severe COVID 19.  Plan:  This patient is a 65 y.o. male that meets the following criteria for Emergency Use Authorization of: Molnupiravir  1. Age >18 yr 2. SARS-COV-2 positive test 3. Symptom onset < 5 days 4. Mild-to-moderate COVID disease with high risk for severe progression to hospitalization or death   I have spoken and communicated  the following to the patient or parent/caregiver regarding: 1. Molnupiravir is an unapproved drug that is authorized for use under an TEFL teacher.  2. There are no adequate, approved, available products for the treatment of COVID-19 in adults who have mild-to-moderate COVID-19 and are at high risk for progressing to severe COVID-19, including hospitalization or death. 3. Other therapeutics are currently authorized. For additional information on all products authorized for treatment or prevention of COVID-19, please see [email or web address]-[email or web address]/.  4. There are benefits and risks of taking this treatment as outlined in the "Fact Sheet for Patients and Caregivers."  5. "Fact Sheet for Patients and Caregivers" was reviewed with patient. A hard copy will be provided to patient from pharmacy prior to the patient receiving treatment. 6. Patients should continue to self-isolate and use infection control measures (e.g., wear mask, isolate, social distance, avoid sharing personal items, clean and disinfect "high touch" surfaces, and frequent handwashing) according to CDC guidelines.  7. The patient or parent/caregiver has the option to accept or refuse treatment. 8. [employer] has established a pregnancy surveillance program. 9. Females of childbearing potential should use a reliable method of contraception correctly and consistently, as applicable, for the duration of treatment and for 4 days after the last dose of Molnupiravir. 10. Males of reproductive potential who are sexually active with females of childbearing potential should use a reliable method of contraception correctly and consistently during treatment and for at least 3 months after the last dose. 11. Pregnancy status and risk was assessed. Patient verbalized understanding of precautions.   After reviewing above information  with the patient, the patient agrees to receive molnupiravir.  Follow up instructions:    . Take prescription BID x 5 days as directed . Reach out to pharmacist for counseling on medication if desired . For concerns regarding further COVID symptoms please follow up with your PCP or urgent care . For urgent or life-threatening issues, seek care at your local emergency department  The patient was provided an opportunity to ask questions, and all were answered. The patient agreed with the plan and demonstrated an understanding of the instructions.   Script sent to [facility] and opted to pick up RX.  The patient was advised to call their PCP or seek an in-person evaluation if the symptoms worsen or if the condition fails to improve as anticipated.   I provided 10 minutes of non face-to-face telephone visit time during this encounter, and > 50% was spent counseling as documented under my assessment & plan.  Noreene Filbert, NP 12/23/2020 /9:23 AM

## 2020-12-23 NOTE — Telephone Encounter (Signed)
Called to discuss with patient about COVID-19 symptoms and the use of one of the available treatments for those with mild to moderate Covid symptoms and at a high risk of hospitalization.  Pt appears to qualify for outpatient treatment due to co-morbid conditions and/or a member of an at-risk group in accordance with the FDA Emergency Use Authorization.    Symptom onset: 12/21/20 Cough,congestion, sinus pain,fatigue Vaccinated: No Booster? No Immunocompromised? No Qualifiers: HTN,CAD NIH Criteria: Tier 1   Pt. Would like to speak with APP.  Esther Hardy

## 2020-12-24 ENCOUNTER — Ambulatory Visit: Payer: BC Managed Care – PPO | Admitting: Neurology

## 2020-12-28 IMAGING — CR DG CHEST 2V
2 series · 2 of 2 positions shown · non-contrast
Comparison: Single-view of the chest 03/17/2020.

CLINICAL DATA: Continuing shortness of breath in a patient with a
history of [id number] pneumonia.

EXAM:
CHEST - 2 VIEW

[w chest pa]
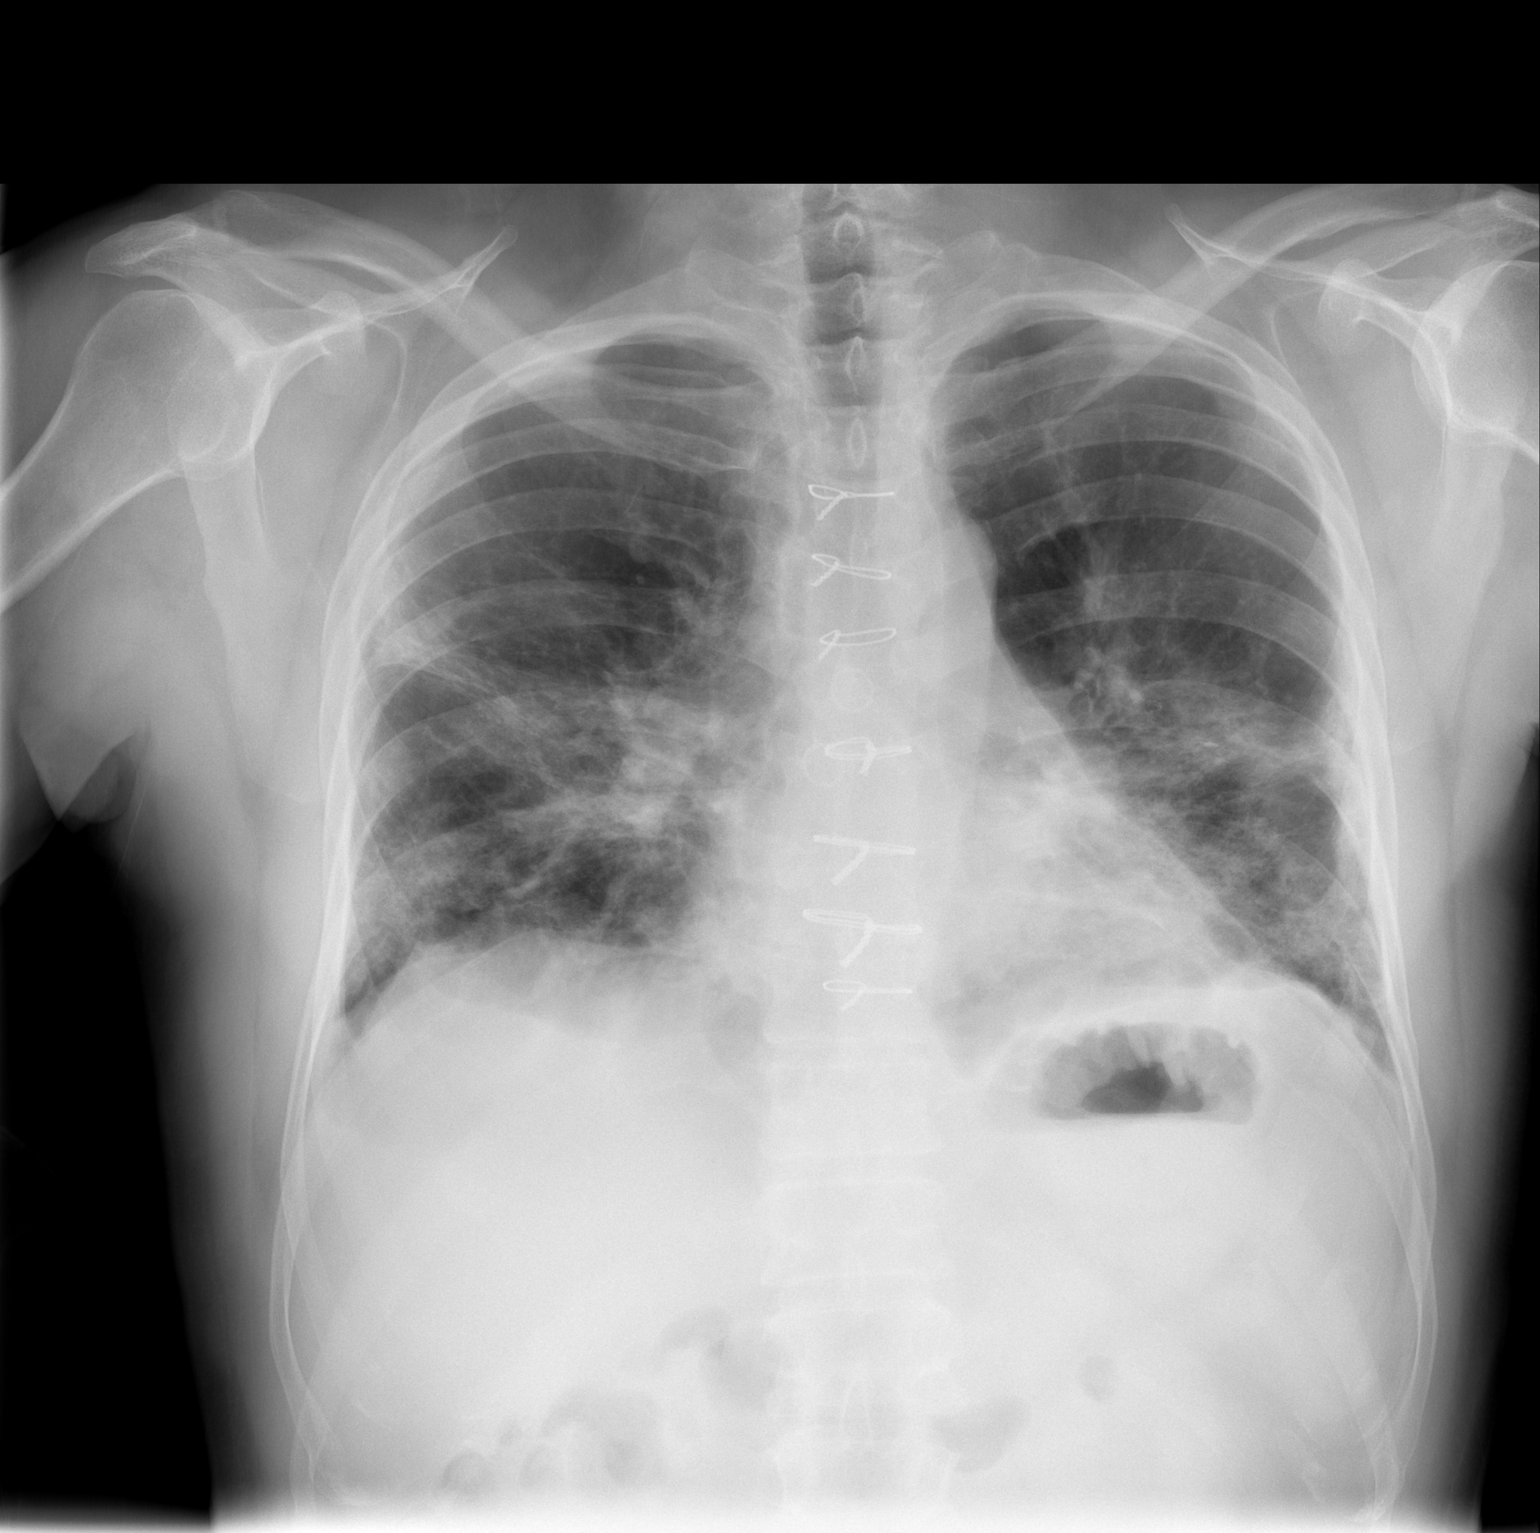

[w chest lat]
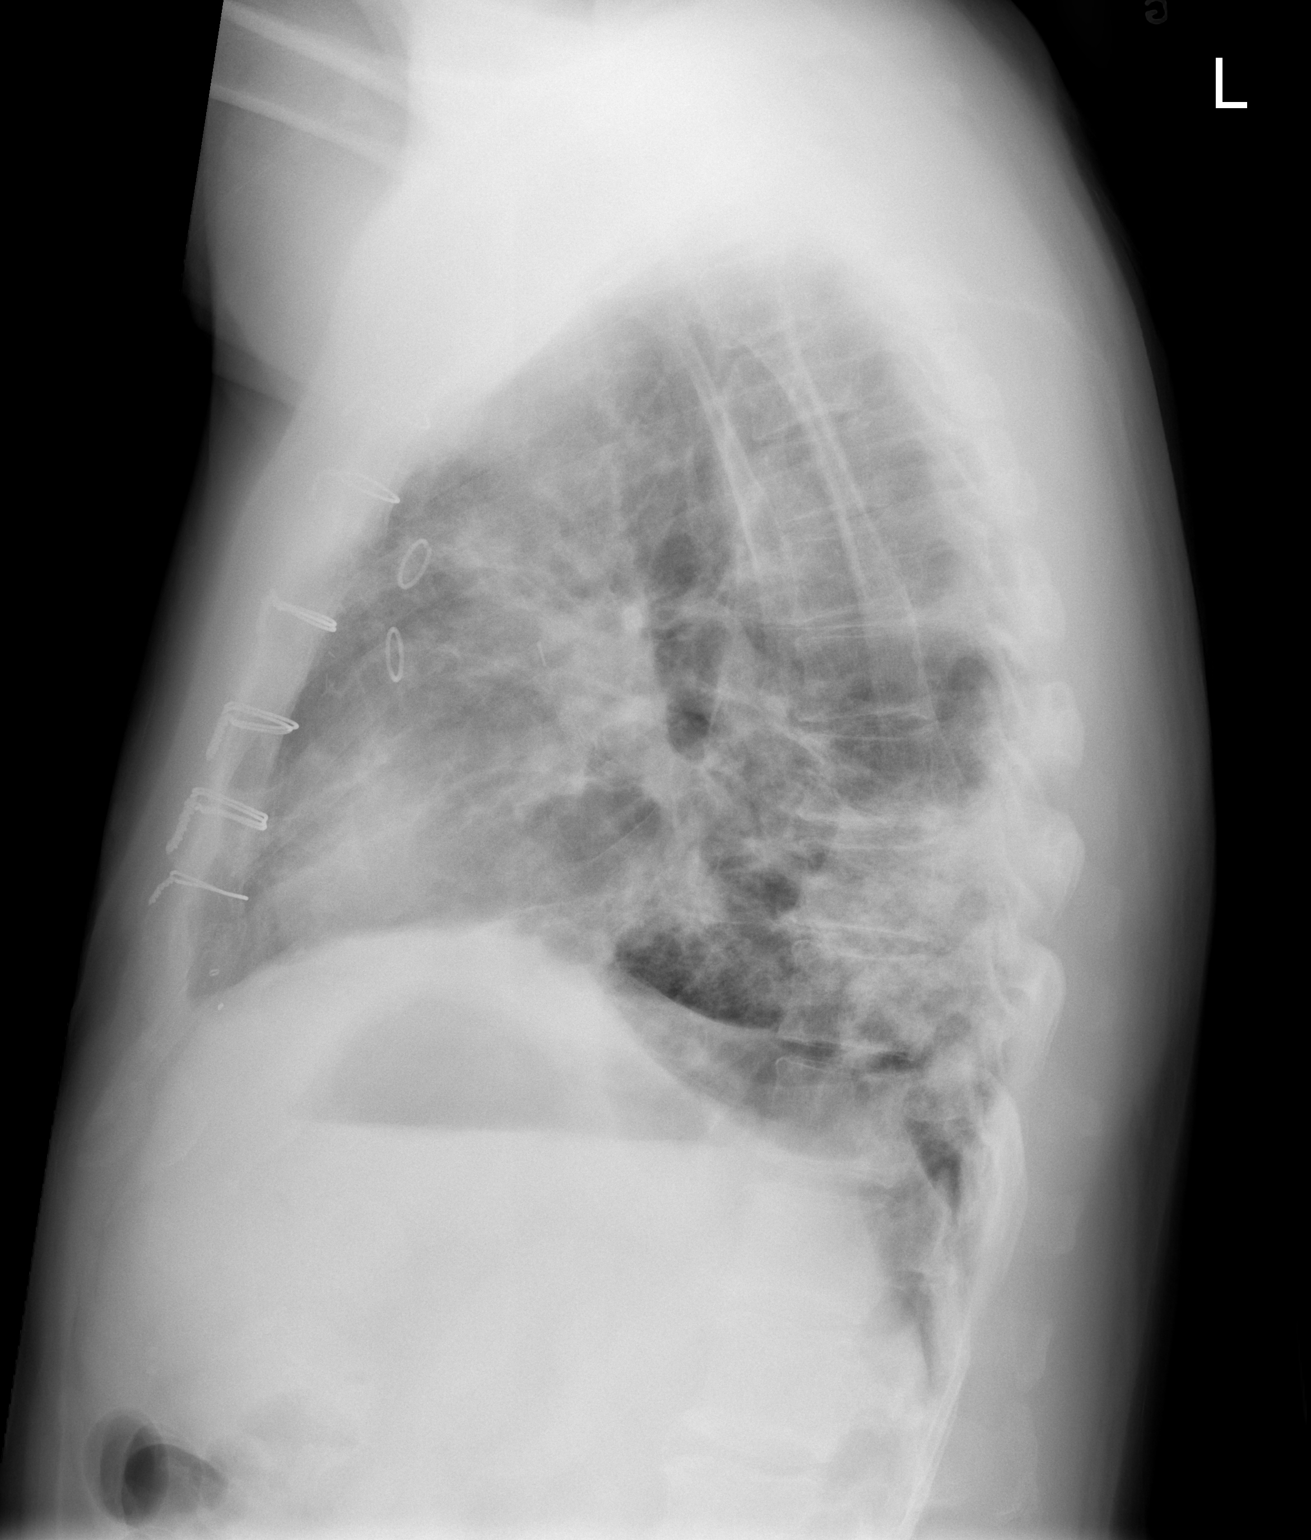

[2 of 2 positions shown; findings below may reference images not displayed]

FINDINGS: Patchy bilateral airspace opacities in the mid and lower lung zones
are again seen but has a more coarse and band like appearance.
Aeration in the bases appears mildly improved. No pneumothorax or
pleural effusion. The patient is status post CABG.
IMPRESSION: Appearance of the chest consistent most suggestive of post COVID
fibrosis.

## 2021-03-23 ENCOUNTER — Other Ambulatory Visit: Payer: Self-pay | Admitting: Physician Assistant

## 2021-06-04 DIAGNOSIS — R0602 Shortness of breath: Secondary | ICD-10-CM | POA: Diagnosis not present

## 2021-06-04 DIAGNOSIS — Z03818 Encounter for observation for suspected exposure to other biological agents ruled out: Secondary | ICD-10-CM | POA: Diagnosis not present

## 2021-06-04 DIAGNOSIS — R5383 Other fatigue: Secondary | ICD-10-CM | POA: Diagnosis not present

## 2021-07-01 DIAGNOSIS — E860 Dehydration: Secondary | ICD-10-CM | POA: Diagnosis not present

## 2021-07-06 DIAGNOSIS — H35033 Hypertensive retinopathy, bilateral: Secondary | ICD-10-CM | POA: Diagnosis not present

## 2021-07-14 ENCOUNTER — Other Ambulatory Visit: Payer: Self-pay | Admitting: Physician Assistant

## 2021-07-15 DIAGNOSIS — Z03818 Encounter for observation for suspected exposure to other biological agents ruled out: Secondary | ICD-10-CM | POA: Diagnosis not present

## 2021-07-15 DIAGNOSIS — J069 Acute upper respiratory infection, unspecified: Secondary | ICD-10-CM | POA: Diagnosis not present

## 2021-07-21 NOTE — Progress Notes (Deleted)
Cardiology Office Note    Date:  07/21/2021   ID:  Michael, Croes Spears 12, 1957, MRN [id number]   PCP:  Donald Prose, [place]  Cardiologist:  Lauree Chandler, MD *** Advanced Practice Provider:  No care team member to display Electrophysiologist:  None   [phone]   No chief complaint on file.   History of Present Illness:  Michael Spears is a 65 y.o. male with history of CAD s/p CABGx4 12/2011 with LIMA to the LAD, sequential SVG to OM1 and OM 2, SVG to PDA. He subsequently re-presented in 01/2012 with NSTEMI with occluded SVG-OMs, s/p DES to native Cx. No ischemia on NST 2018, echo 2018 EF 60-65%. He has had angina over the years treated with antianginals (Imdur stopped due to headache, , chronic angina, dizziness, chronic fatigue (declined sleep study due to affordability), PACs, PVCs, chronic neck pain, fibromyalgia, tobacco abuse, prior pericardial effusion in 2013, chronic RBBB, acute respiratory failure with severe hypoxemia in 03/2020 due to Covid-19 requiring home O2 at discharge     Past Medical History:  Diagnosis Date   Acute hypoxemic respiratory failure due to COVID-19 ([place]) 03/2020   Chest pain    Imdur added during admxn 7/13=> intol 2/2 HA (stopped): Norvasc added 8/13;  Ranexa 500 bid added 8/13 (QTc stable) => CP much improved   Chronic neck pain    Coronary artery disease    s/p 4V CABG 12/10/11 (LIMA to LAD, sequential SVG to OM1 & OM2, SVG to PDA);  NSTEMI 01/13/12:  LHC 01/14/12: pLAD 40%, mLAD occluded, Dx 40%, pCFX 90%, OM1 small and occluded at the ostium, OM2 small with 99% stenosis, proximal and mid RCA 50%, mRCA occluded, S-OM1 and OM2 occluded, S-PDA okay, LIMA-LAD okay, EF 60%.  PCI: Promus DES to the pCFX     Cough    Fibromyalgia    Headache(784.0)    History of tobacco abuse    0.5 packs/day for 25 years, quit 2005   HLD (hyperlipidemia)    Hypertension    Pericardial effusion    echo 12/29/11 small to moderate  pericardial effusion primarily posteriorly and anterolaterally; echo 01/14/12: Mild LVH, EF 65%, moderate pericardial effusion circumferential to the heart with no evidence of Tamponade-similar to 12/29/11 ; trivial effusion by echo 7/13   Pneumonia    HX OF pna   Tobacco abuse     Past Surgical History:  Procedure Laterality Date   APPENDECTOMY     CORONARY ARTERY BYPASS GRAFT  12/10/2011   Procedure: CORONARY ARTERY BYPASS GRAFTING (CABG);  Surgeon: Grace Isaac, MD;  Location: [place];  Service: Open Heart Surgery;  Laterality: N/A;  Times four  using endoscopically harvested right greater saphenous and left internal mammary artery.   LEFT HEART CATHETERIZATION WITH CORONARY ANGIOGRAM N/A 12/07/2011   Procedure: LEFT HEART CATHETERIZATION WITH CORONARY ANGIOGRAM;  Surgeon: Burnell Blanks, MD;  Location[facility] C[facility];  Service: Cardiovascular;  Laterality: N/A;   LEFT HEART CATHETERIZATION WITH CORONARY ANGIOGRAM N/A 01/14/2012   Procedure: LEFT HEART CATHETERIZATION WITH CORONARY ANGIOGRAM;  Surgeon: Burnell Blanks, MD;  Location: [facility] CATH LAB;  Service: Cardiovascular;  Laterality: N/A;   MOUTH SURGERY     NASAL SINUS SURGERY     PERCUTANEOUS CORONARY STENT INTERVENTION (PCI-S)  01/14/2012   Procedure: PERCUTANEOUS CORONARY STENT INTERVENTION (PCI-S);  Surgeon: Burnell Blanks, MD;  Location: [facility] CATH LAB;  Service: Cardiovascular;;    Current Medications: No  outpatient medications have been marked as taking for the 08/04/21 encounter (Appointment) with Dyann Kief, PA-C.     Allergies:   Patient has no known allergies.   Social History   Socioeconomic History   Marital status: Divorced    Spouse name: Not on file   Number of children: Not on file   Years of education: Not on file   Highest education level: Not on file  Occupational History   Not on file  Tobacco Use   Smoking status: Former    Packs/day: 0.50    Years: 25.00    Pack years: 12.50     Types: Cigarettes    Quit date: 12/07/2003    Years since quitting: 17.6   Smokeless tobacco: Never  Substance and Sexual Activity   Alcohol use: No   Drug use: No   Sexual activity: Yes  Other Topics Concern   Not on file  Social History Narrative   Not on file   Social Determinants of Health   Financial Resource Strain: Not on file  Food Insecurity: Not on file  Transportation Needs: Not on file  Physical Activity: Not on file  Stress: Not on file  Social Connections: Not on file     Family History:  The patient's ***family history includes Crohn's disease in his father; Hypertension in his father and mother.   ROS:   Please see the history of present illness.    ROS All other systems reviewed and are negative.   PHYSICAL EXAM:   VS:  There were no vitals taken for this visit.  Physical Exam  GEN: Well nourished, well developed, in no acute distress  HEENT: normal  Neck: no JVD, carotid bruits, or masses Cardiac:RRR; no murmurs, rubs, or gallops  Respiratory:  clear to auscultation bilaterally, normal work of breathing GI: soft, nontender, nondistended, + BS Ext: without cyanosis, clubbing, or edema, Good distal pulses bilaterally MS: no deformity or atrophy  Skin: warm and dry, no rash Neuro:  Alert and Oriented x 3, Strength and sensation are intact Psych: euthymic mood, full affect  Wt Readings from Last 3 Encounters:  09/19/20 193 lb (87.5 kg)  08/08/20 196 lb 12.8 oz (89.3 kg)  03/17/20 195 lb (88.5 kg)      Studies/Labs Reviewed:   EKG:  EKG is*** ordered today.  The ekg ordered today demonstrates ***  Recent Labs: 08/08/2020: ALT 18; BUN 21; Creatinine, Ser 1.38; Hemoglobin 16.2; NT-Pro BNP 25; Platelets 182; Potassium 4.5; Sodium 142   Lipid Panel    Component Value Date/Time   CHOL 125 08/08/2020 1440   TRIG 117 08/08/2020 1440   HDL 39 (L) 08/08/2020 1440   CHOLHDL 3.2 08/08/2020 1440   CHOLHDL 4 09/21/2013 0836   VLDL 39.2 09/21/2013 0836    LDLCALC 65 08/08/2020 1440    Additional studies/ records that were reviewed today include:  Echo 2018 Study Conclusions   - Left ventricle: The cavity size was normal. Systolic function was    normal. The estimated ejection fraction was in the range of 60%    to 65%. Wall motion was normal; there were no regional wall    motion abnormalities. Left ventricular diastolic function    parameters were normal. Doppler parameters are consistent with    indeterminate ventricular filling pressure.  - Aortic valve: Transvalvular velocity was within the normal range.    There was no stenosis. There was no regurgitation.  - Mitral valve: Transvalvular velocity was within the normal  range.    There was no evidence for stenosis. There was trivial    regurgitation.  - Right ventricle: The cavity size was normal. Wall thickness was    normal. Systolic function was normal.  - Tricuspid valve: There was trivial regurgitation.  - Pulmonary arteries: Systolic pressure was within the normal    range. PA peak pressure: 22 mm Hg (S).   NST 2018 Nuclear stress EF: 68%. The left ventricular ejection fraction is hyperdynamic (>65%). The study is normal. This is a low risk study.     Risk Assessment/Calculations:   {Does this patient have ATRIAL FIBRILLATION?[phone]}     ASSESSMENT:    1. Coronary artery disease involving coronary bypass graft of native heart without angina pectoris   2. PVC's (premature ventricular contractions)   3. Hyperlipidemia LDL goal <70      PLAN:  In order of problems listed above:   CAD s/p prior CABG with chronic stable angina - . Continue ASA, BB, Ranexa, statin, Plavix (has been maintained on long term DAPT by Dr. Angelena Form). QTC stable on Ranexa.      PACs, PVCs - generally quiescent on beta blocker. Continue to monitor clinically. EKG NSR.   Hyperlipidemia  Shared Decision Making/Informed Consent   {Are you ordering a CV Procedure (e.g. stress test,  cath, DCCV, TEE, etc)?   Press F2        :[id number]    Medication Adjustments/Labs and Tests Ordered: Current medicines are reviewed at length with the patient today.  Concerns regarding medicines are outlined above.  Medication changes, Labs and Tests ordered today are listed in the Patient Instructions below. There are no Patient Instructions on file for this visit.   Sumner Boast, PA-C  07/21/2021 8:43 AM    [facility] [place], [place], [place]  [address] Phone: [phone]; Fax: [phone]

## 2021-08-04 ENCOUNTER — Ambulatory Visit: Payer: BC Managed Care – PPO | Admitting: Physician Assistant

## 2021-09-21 ENCOUNTER — Other Ambulatory Visit: Payer: Self-pay | Admitting: Physician Assistant

## 2021-09-22 ENCOUNTER — Other Ambulatory Visit: Payer: Self-pay | Admitting: Physician Assistant

## 2021-10-04 ENCOUNTER — Other Ambulatory Visit: Payer: Self-pay | Admitting: Physician Assistant

## 2021-10-21 ENCOUNTER — Ambulatory Visit: Payer: BC Managed Care – PPO | Admitting: Physician Assistant

## 2021-10-25 ENCOUNTER — Other Ambulatory Visit: Payer: Self-pay | Admitting: Physician Assistant

## 2021-11-02 ENCOUNTER — Encounter: Payer: Self-pay | Admitting: Cardiovascular Disease

## 2021-11-03 ENCOUNTER — Other Ambulatory Visit: Payer: Self-pay

## 2021-11-03 MED ORDER — RANOLAZINE ER 500 MG PO TB12
500.0000 mg | ORAL_TABLET | Freq: Two times a day (BID) | ORAL | 0 refills | Status: DC
Start: 2021-11-03 — End: 2022-01-04

## 2021-11-03 NOTE — Telephone Encounter (Signed)
Pt's medication was sent to pt's pharmacy as requested. Confirmation received.  °

## 2021-11-10 ENCOUNTER — Other Ambulatory Visit: Payer: Self-pay | Admitting: Cardiovascular Disease

## 2021-11-16 ENCOUNTER — Other Ambulatory Visit: Payer: Self-pay | Admitting: Physician Assistant

## 2021-12-05 ENCOUNTER — Other Ambulatory Visit: Payer: Self-pay | Admitting: Cardiovascular Disease

## 2021-12-08 ENCOUNTER — Ambulatory Visit: Payer: BC Managed Care – PPO | Admitting: Physician Assistant

## 2021-12-21 ENCOUNTER — Other Ambulatory Visit: Payer: Self-pay | Admitting: Cardiovascular Disease

## 2021-12-28 ENCOUNTER — Other Ambulatory Visit: Payer: Self-pay | Admitting: Cardiovascular Disease

## 2022-01-03 NOTE — Progress Notes (Unsigned)
Cardiology Office Note:    Date:  01/04/2022   ID:  Michael Spears, DOB 1956-03-28, MRN [id number]  PCP:  Donald Prose, MD  [facility] Providers Cardiologist:  Lauree Chandler, MD    Referring MD: Donald Prose, MD   Chief Complaint:  Follow-up for CAD    Patient Profile: Coronary artery disease with angina S/p CABG in May 2013 (LIMA-LAD, SVG-OM1/OM 2, SVG-PDA) NSTEMI June 2013 s/p DES to the proximal LCx (SVG-OM1/OM2 occluded) Intolerant of isosorbide Rx with ranolazine Hx pericardial effusion Right Bundle Branch Block  Hypertension Hyperlipidemia Chronic fatigue Fibromyalgia History of COVID-19 in 2021 resulting in hypoxic respiratory failure  Prior CV Studies: GATED SPECT MYO PERF W/LEXISCAN STRESS 1D 12/08/2016 EF 68, normal perfusion, low risk   ECHO COMPLETE WO IMAGING ENHANCING AGENT 12/08/2016 EF 60-65, no RWMA, trivial MR, normal RVSF, trivial TR, PASP 22, no pericardial effusion  Pre-CABG Dopplers 12/07/2011 No significant ICA stenosis bilaterally  History of Present Illness:   Michael Spears is a 66 y.o. male with the above problem list.  He was last seen in January 2022 by Michael Copa, PA-C.  He returns for follow-up.  He is here alone.  Overall, he is doing well.  He has occasional anginal symptoms with overexertion.  He also notes chronic shortness of breath with exertion.  He is NYHA II-IIb.  He has not had chest pain at rest.  He has not had syncope, orthopnea, leg edema.  Overall, his chest discomfort and shortness of breath are fairly stable.        Past Medical History:  Diagnosis Date   Acute hypoxemic respiratory failure due to COVID-19 ([place]) 03/2020   Chest pain    Imdur added during admxn 7/13=> intol 2/2 HA (stopped): Norvasc added 8/13;  Ranexa 500 bid added 8/13 (QTc stable) => CP much improved   Chronic neck pain    Coronary artery disease    s/p 4V CABG 12/10/11 (LIMA to LAD, sequential SVG to OM1 & OM2, SVG to PDA);  NSTEMI 01/13/12:  LHC  01/14/12: pLAD 40%, mLAD occluded, Dx 40%, pCFX 90%, OM1 small and occluded at the ostium, OM2 small with 99% stenosis, proximal and mid RCA 50%, mRCA occluded, S-OM1 and OM2 occluded, S-PDA okay, LIMA-LAD okay, EF 60%.  PCI: Promus DES to the pCFX     Cough    Fibromyalgia    Headache(784.0)    History of tobacco abuse    0.5 packs/day for 25 years, quit 2005   HLD (hyperlipidemia)    Hypertension    Pericardial effusion    echo 12/29/11 small to moderate pericardial effusion primarily posteriorly and anterolaterally; echo 01/14/12: Mild LVH, EF 65%, moderate pericardial effusion circumferential to the heart with no evidence of Tamponade-similar to 12/29/11 ; trivial effusion by echo 7/13   Pneumonia    HX OF pna   Tobacco abuse    Current Medications: Current Meds  Medication Sig   albuterol (VENTOLIN HFA) 108 (90 Base) MCG/ACT inhaler Inhale 2 puffs into the lungs every 6 (six) hours as needed for wheezing or shortness of breath.   [DISCONTINUED] aspirin 81 MG EC tablet Take 1 tablet (81 mg total) by mouth daily.   [DISCONTINUED] atorvastatin (LIPITOR) 80 MG tablet TAKE 1 TABLET BY MOUTH EVERY DAY   [DISCONTINUED] clopidogrel (PLAVIX) 75 MG tablet TAKE 1 TABLET BY MOUTH EVERY DAY   [DISCONTINUED] metoprolol tartrate (LOPRESSOR) 25 MG tablet TAKE 1 TABLET BY MOUTH TWICE A DAY   [DISCONTINUED] nitroGLYCERIN (  NITROSTAT) 0.4 MG SL tablet Place 1 tablet (0.4 mg total) under the tongue every 5 (five) minutes as needed for chest pain (up to 3 doses).   [DISCONTINUED] ranolazine (RANEXA) 500 MG 12 hr tablet Take 1 tablet (500 mg total) by mouth 2 (two) times daily. Please keep upcoming appt in May 2023 with Cardiologist before anymore refills. Thank you Final Attempt    Allergies:   Patient has no known allergies.   Social History   Tobacco Use   Smoking status: Former    Packs/day: 0.50    Years: 25.00    Pack years: 12.50    Types: Cigarettes    Quit date: 12/07/2003    Years since  quitting: 18.0   Smokeless tobacco: Never  Substance Use Topics   Alcohol use: No   Drug use: No    Family Hx: The patient's family history includes Crohn's disease in his father; Hypertension in his father and mother.  Review of Systems  Cardiovascular:  Negative for claudication.  Gastrointestinal:  Negative for hematochezia.  Genitourinary:  Negative for hematuria.    EKGs/Labs/Other Test Reviewed:    EKG:  EKG is  ordered today.  The ekg ordered today demonstrates NSR, HR 75, inferior Q waves, right bundle branch block, QTc 433, no change from prior tracing  Recent Labs: No results found for requested labs within last 8760 hours.   Recent Lipid Panel No results for input(s): CHOL, TRIG, HDL, VLDL, LDLCALC, LDLDIRECT in the last 8760 hours.   Risk Assessment/Calculations:         Physical Exam:    VS:  BP (!) 144/90   Pulse 75   Ht _0  (1.778 m)   Wt 190 lb 3.2 oz (86.3 kg)   SpO2 95%   BMI 27.29 kg/m     Wt Readings from Last 3 Encounters:  01/04/22 190 lb 3.2 oz (86.3 kg)  09/19/20 193 lb (87.5 kg)  08/08/20 196 lb 12.8 oz (89.3 kg)    Constitutional:      Appearance: Healthy appearance. Not in distress.  Neck:     Vascular: No JVR. JVD normal.  Pulmonary:     Effort: Pulmonary effort is normal.     Breath sounds: No wheezing. No rales.  Cardiovascular:     Normal rate. Regular rhythm. Normal S1. Normal S2.      Murmurs: There is no murmur.  Pulses:    Intact distal pulses.  Edema:    Peripheral edema absent.  Abdominal:     Palpations: Abdomen is soft.  Skin:    General: Skin is warm and dry.  Neurological:     General: No focal deficit present.     Mental Status: Alert and oriented to person, place and time.     Cranial Nerves: Cranial nerves are intact.        ASSESSMENT & PLAN:   CAD (coronary artery disease) History of CABG in May 2013 and subsequent non-STEMI in June 2013 secondary to occlusion of the vein graft to the OM1/OM2.   SVG-OM1/OM2 was treated with a DES.  He has had chronic angina since.  He describes CCS class I-II angina.  His symptoms are overall stable without significant change.  He had an admission in August 2021 with COVID-19 resulting in hypoxic respiratory failure.  He was taken off of amlodipine at that time secondary to hypotension.  His pressure is somewhat elevated today.  However, he has been off of metoprolol tartrate for more than  24 hours.  He will resume metoprolol to tartrate 25 mg twice daily and monitor blood pressure over the next couple weeks.  If his pressure is running higher, we can resume amlodipine which should help improve some of his anginal symptoms.  Otherwise continue aspirin 81 mg daily, clopidogrel 75 mg daily, atorvastatin 80 mg daily, ranolazine 500 mg twice daily.  He has remained on DAPT since his surgery.  He is tolerating this and prefers to remain on both for now.  Follow-up in 1 year.  Essential hypertension Blood pressure above goal.  Monitor blood pressure over the next 2 weeks and send readings for review.  Continue metoprolol tartrate 25 mg twice daily.  If blood pressure is above target, add amlodipine to medical regimen.  Hyperlipidemia LDL goal <70 Continue atorvastatin 80 mg daily.  Obtain fasting CMET, lipids today.           Dispo:  Return in about 1 year (around 01/05/2023) for Routine Follow Up with Dr. Angelena Form.   Medication Adjustments/Labs and Tests Ordered: Current medicines are reviewed at length with the patient today.  Concerns regarding medicines are outlined above.  Tests Ordered: Orders Placed This Encounter  Procedures   Comp Met (CMET)   Lipid panel   EKG 12-Lead   Medication Changes: Meds ordered this encounter  Medications   aspirin EC 81 MG tablet    Sig: Take 1 tablet (81 mg total) by mouth daily.    Dispense:  30 tablet    Refill:  11   atorvastatin (LIPITOR) 80 MG tablet    Sig: Take 1 tablet (80 mg total) by mouth daily.     Dispense:  30 tablet    Refill:  11   clopidogrel (PLAVIX) 75 MG tablet    Sig: Take 1 tablet (75 mg total) by mouth daily.    Dispense:  30 tablet    Refill:  11   metoprolol tartrate (LOPRESSOR) 25 MG tablet    Sig: Take 1 tablet (25 mg total) by mouth 2 (two) times daily.    Dispense:  60 tablet    Refill:  11   nitroGLYCERIN (NITROSTAT) 0.4 MG SL tablet    Sig: Place 1 tablet (0.4 mg total) under the tongue every 5 (five) minutes as needed for chest pain (up to 3 doses).    Dispense:  25 tablet    Refill:  6   ranolazine (RANEXA) 500 MG 12 hr tablet    Sig: Take 1 tablet (500 mg total) by mouth 2 (two) times daily.    Dispense:  60 tablet    Refill:  [address] [place], [place]  01/04/2022 9:59 AM    [facility] [address], [place], [place]  [address] Phone: [phone]; Fax: [phone]

## 2022-01-04 ENCOUNTER — Ambulatory Visit: Payer: 59 | Admitting: Physician Assistant

## 2022-01-04 ENCOUNTER — Encounter: Payer: Self-pay | Admitting: Physician Assistant

## 2022-01-04 VITALS — BP 144/90 | HR 75 | Ht 70.0 in | Wt 190.2 lb

## 2022-01-04 DIAGNOSIS — I10 Essential (primary) hypertension: Secondary | ICD-10-CM | POA: Diagnosis not present

## 2022-01-04 DIAGNOSIS — I25119 Atherosclerotic heart disease of native coronary artery with unspecified angina pectoris: Secondary | ICD-10-CM

## 2022-01-04 DIAGNOSIS — E785 Hyperlipidemia, unspecified: Secondary | ICD-10-CM

## 2022-01-04 LAB — COMPREHENSIVE METABOLIC PANEL
ALT: 18 IU/L (ref 0–44)
AST: 23 IU/L (ref 0–40)
Albumin/Globulin Ratio: 2.8 — ABNORMAL HIGH (ref 1.2–2.2)
Albumin: 4.8 g/dL (ref 3.8–4.8)
Alkaline Phosphatase: 93 IU/L (ref 44–121)
BUN/Creatinine Ratio: 12 (ref 10–24)
BUN: 15 mg/dL (ref 8–27)
Bilirubin Total: 0.5 mg/dL (ref 0.0–1.2)
CO2: 24 mmol/L (ref 20–29)
Calcium: 10.5 mg/dL — ABNORMAL HIGH (ref 8.6–10.2)
Chloride: 107 mmol/L — ABNORMAL HIGH (ref 96–106)
Creatinine, Ser: 1.21 mg/dL (ref 0.76–1.27)
Globulin, Total: 1.7 g/dL (ref 1.5–4.5)
Glucose: 99 mg/dL (ref 70–99)
Potassium: 5 mmol/L (ref 3.5–5.2)
Sodium: 143 mmol/L (ref 134–144)
Total Protein: 6.5 g/dL (ref 6.0–8.5)
eGFR: 66 mL/min/{1.73_m2} (ref >59)

## 2022-01-04 LAB — LIPID PANEL
Chol/HDL Ratio: 2.6 ratio (ref 0.0–5.0)
Cholesterol, Total: 127 mg/dL (ref 100–199)
HDL: 48 mg/dL (ref >39)
LDL Chol Calc (NIH): 65 mg/dL (ref 0–99)
Triglycerides: 68 mg/dL (ref 0–149)
VLDL Cholesterol Cal: 14 mg/dL (ref 5–40)

## 2022-01-04 MED ORDER — CLOPIDOGREL BISULFATE 75 MG PO TABS: 75.0000 mg | ORAL_TABLET | Freq: Every day | ORAL | 11 refills | Status: DC

## 2022-01-04 MED ORDER — NITROGLYCERIN 0.4 MG SL SUBL: 0.4000 mg | SUBLINGUAL_TABLET | SUBLINGUAL | 6 refills | Status: DC | PRN

## 2022-01-04 MED ORDER — METOPROLOL TARTRATE 25 MG PO TABS: 25.0000 mg | ORAL_TABLET | Freq: Two times a day (BID) | ORAL | 11 refills | Status: DC

## 2022-01-04 MED ORDER — RANOLAZINE ER 500 MG PO TB12: 500.0000 mg | ORAL_TABLET | Freq: Two times a day (BID) | ORAL | 11 refills | Status: DC

## 2022-01-04 MED ORDER — ASPIRIN 81 MG PO TBEC
81.0000 mg | DELAYED_RELEASE_TABLET | Freq: Every day | ORAL | 11 refills | Status: AC
Start: 2022-01-04 — End: ?

## 2022-01-04 MED ORDER — ATORVASTATIN CALCIUM 80 MG PO TABS: 80.0000 mg | ORAL_TABLET | Freq: Every day | ORAL | 11 refills | Status: DC

## 2022-01-04 NOTE — Assessment & Plan Note (Signed)
History of CABG in May 2013 and subsequent non-STEMI in June 2013 secondary to occlusion of the vein graft to the OM1/OM2.  SVG-OM1/OM2 was treated with a DES.  He has had chronic angina since.  He describes CCS class I-II angina.  His symptoms are overall stable without significant change.  He had an admission in August 2021 with COVID-19 resulting in hypoxic respiratory failure.  He was taken off of amlodipine at that time secondary to hypotension.  His pressure is somewhat elevated today.  However, he has been off of metoprolol tartrate for more than 24 hours.  He will resume metoprolol to tartrate 25 mg twice daily and monitor blood pressure over the next couple weeks.  If his pressure is running higher, we can resume amlodipine which should help improve some of his anginal symptoms.  Otherwise continue aspirin 81 mg daily, clopidogrel 75 mg daily, atorvastatin 80 mg daily, ranolazine 500 mg twice daily.  He has remained on DAPT since his surgery.  He is tolerating this and prefers to remain on both for now.  Follow-up in 1 year.

## 2022-01-04 NOTE — Assessment & Plan Note (Signed)
Blood pressure above goal.  Monitor blood pressure over the next 2 weeks and send readings for review.  Continue metoprolol tartrate 25 mg twice daily.  If blood pressure is above target, add amlodipine to medical regimen.

## 2022-01-04 NOTE — Assessment & Plan Note (Signed)
Continue atorvastatin 80 mg daily.  Obtain fasting CMET, lipids today.

## 2022-01-04 NOTE — Patient Instructions (Signed)
Medication Instructions:  Your physician recommends that you continue on your current medications as directed. Please refer to the Current Medication list given to you today.  *If you need a refill on your cardiac medications before your next appointment, please call your pharmacy*   Lab Work: TODAY:  CMET & LIPID  If you have labs (blood work) drawn today and your tests are completely normal, you will receive your results only by: MyChart Message (if you have MyChart) OR A paper copy in the mail If you have any lab test that is abnormal or we need to change your treatment, we will call you to review the results.   Testing/Procedures: None ordered   Follow-Up: At [facility], you and your health needs are our priority.  As part of our continuing mission to provide you with exceptional heart care, we have created designated Provider Care Teams.  These Care Teams include your primary Cardiologist (physician) and Advanced Practice Providers (APPs -  Physician Assistants and Nurse Practitioners) who all work together to provide you with the care you need, when you need it.  We recommend signing up for the patient portal called "MyChart".  Sign up information is provided on this After Visit Summary.  MyChart is used to connect with patients for Virtual Visits (Telemedicine).  Patients are able to view lab/test results, encounter notes, upcoming appointments, etc.  Non-urgent messages can be sent to your provider as well.   To learn more about what you can do with MyChart, go to [email or web address].    Your next appointment:   1 year(s)  The format for your next appointment:   In Person  Provider:   Verne Carrow, MD     Other Instructions Your physician has requested that you regularly monitor and record your blood pressure readings at home. Please use the same machine at the same time of day to check your readings and record them to bring to your follow-up visit.    Please monitor blood pressures and keep a log of your readings and send in a mychart message in 2 weeks with those readings..    Make sure to check 2 hours after your medications.    AVOID these things for 30 minutes before checking your blood pressure: No Drinking caffeine. No Drinking alcohol. No Eating. No Smoking. No Exercising.   Five minutes before checking your blood pressure: Pee. Sit in a dining chair. Avoid sitting in a soft couch or armchair. Be quiet. Do not talk   Important Information About Sugar

## 2022-07-06 NOTE — Progress Notes (Unsigned)
Cardiology Office Note:    Date:  07/07/2022   ID:  Michael, Spears [birth year], MRN [id number]  PCP:  [name], MD  [facility] Providers Cardiologist:  Verne Carrow, MD    Referring MD: [name], MD   Chief Complaint:  Chest Pain and Shortness of Breath    Patient Profile: Coronary artery disease with angina S/p CABG in May 2013 (LIMA-LAD, SVG-OM1/OM 2, SVG-PDA) NSTEMI June 2013 s/p DES to the proximal LCx (SVG-OM1/OM2 occluded) Intolerant of isosorbide; Rx with ranolazine Myoview 12/08/16: EF 68, normal perfusion, low risk  TTE 12/08/16: EF 60-65, no RWMA, trivial MR, normal RVSF, trivial TR, PASP 22, no pericardial effusion  Hx pericardial effusion Right Bundle Branch Block  Hypertension Hyperlipidemia Chronic fatigue Fibromyalgia History of COVID-19 in 2021 resulting in hypoxic respiratory failure   History of Present Illness:   Michael Spears is a 66 y.o. male with the above problem list.  He was last seen 01/04/22.  He returns for the evaluation of chest pain or shortness of breath.  Over the last 4 to 5 months, he has noted dyspnea with walking certain distances, especially up an incline.  He has also noted chest discomfort described as an indigestion feeling.  He has not had any rest symptoms.  His shortness of breath has gotten slightly worse.  He also notes fatigue and exercise intolerance.  His symptoms or not reminiscent of his previous angina.  He has not had any radiating symptoms, associated nausea or diaphoresis.  He has not used nitroglycerin.  He has not had orthopnea, leg edema, PND or syncope.  EKG: Sinus bradycardia, HR 59, normal axis, right bundle branch block, no acute ST-T wave changes, QTc 405, no change from prior tracing     Reviewed and updated this encounter:  Tobacco  Allergies  Meds  Problems  Med Hx  Surg Hx  Fam Hx      Review of Systems  Constitutional: Negative for chills and fever.  Respiratory:  Negative for  cough.   Gastrointestinal:  Negative for hematochezia and melena.  Genitourinary:  Negative for hematuria.     Labs/Other Test Reviewed:    Recent Labs: 01/04/2022: ALT 18; BUN 15; Creatinine, Ser 1.21; Potassium 5.0; Sodium 143   Recent Lipid Panel Recent Labs    01/04/22 1011  CHOL 127  TRIG 68  HDL 48  LDLCALC 65    Risk Assessment/Calculations/Metrics:         HYPERTENSION CONTROL Vitals:   07/07/22 0835 07/07/22 0905  BP: (!) 150/83 (!) 144/96    The patient's blood pressure is elevated above target today.  In order to address the patient's elevated BP: A current anti-hypertensive medication was adjusted today.       Physical Exam:    VS:  BP (!) 144/96   Pulse 77   Ht 5\' 10"  (1.778 m)   Wt 202 lb (91.6 kg)   SpO2 96%   BMI 28.98 kg/m     Wt Readings from Last 3 Encounters:  07/07/22 202 lb (91.6 kg)  01/04/22 190 lb 3.2 oz (86.3 kg)  09/19/20 193 lb (87.5 kg)    Constitutional:      Appearance: Healthy appearance. Not in distress.  Neck:     Vascular: No JVR. JVD normal.  Pulmonary:     Effort: Pulmonary effort is normal.     Breath sounds: No wheezing. No rales.  Cardiovascular:     Normal rate. Regular rhythm. Normal S1.  Normal S2.      Murmurs: There is no murmur.  Pulses:    Intact distal pulses.  Edema:    Peripheral edema absent.  Abdominal:     Palpations: Abdomen is soft.  Neurological:     General: No focal deficit present.     Mental Status: Alert and oriented to person, place and time.         ASSESSMENT & PLAN:   CAD (coronary artery disease) History of CABG in May 2013 and subsequent non-STEMI in June 2013 secondary to occlusion of the vein graft to the OM1/OM2.  SVG-OM1/OM2 was treated with a DES.  He has had chronic angina since.  Over the past 4 to 5 months, he has noted worsening shortness of breath with exertion as well as chest discomfort described as indigestion.  He has not had any rest symptoms and he probably  describes CCS class II-III angina.  Electrocardiogram is unchanged.  His blood pressure is also uncontrolled. Increase amlodipine to 10 mg daily Continue aspirin 81 mg daily, Plavix 75 mg daily, Lopressor 25 mg twice daily, Ranexa 500 mg twice daily, Lipitor 80 mg daily Arrange ETT-Myoview Arrange echocardiogram BMET, CBC, TSH, BNP Follow-up 6-8 weeks  Hyperlipidemia LDL goal <70 LDL optimal on most recent lab work.  Continue current Rx with Lipitor 80 mg daily.    Essential hypertension Blood pressure is uncontrolled.  Increase amlodipine to 10 mg daily as noted.  Continue Lopressor 25 mg twice daily.  If blood pressure remains uncontrolled, consider ACE/ARB.  Dyspnea Proceed with stress testing and echocardiogram as noted.  Obtain labs as noted including BNP.  He does have a history of COVID-19 in 2021.  He notes he was in the ICU.  He has had some respiratory difficulty since that time.  If cardiac workup unremarkable, consider referral to pulmonology.        Shared Decision Making/Informed Consent The risks [chest pain, shortness of breath, cardiac arrhythmias, dizziness, blood pressure fluctuations, myocardial infarction, stroke/transient ischemic attack, nausea, vomiting, allergic reaction, radiation exposure, metallic taste sensation and life-threatening complications (estimated to be 1 in 10,000)], benefits (risk stratification, diagnosing coronary artery disease, treatment guidance) and alternatives of a nuclear stress test were discussed in detail with Michael Spears and he agrees to proceed.   Dispo:  Return in about 8 weeks (around 09/01/2022) for Follow up after testing w Dr. [name], or Tereso Newcomer, PA-C.   Medication Adjustments/Labs and Tests Ordered: Current medicines are reviewed at length with the patient today.  Concerns regarding medicines are outlined above.  Tests Ordered: Orders Placed This Encounter  Procedures   Basic metabolic panel   CBC   TSH   Pro b  natriuretic peptide (BNP)   MYOCARDIAL PERFUSION IMAGING   EKG 12-Lead   ECHOCARDIOGRAM COMPLETE   Medication Changes: Meds ordered this encounter  Medications   amLODipine (NORVASC) 10 MG tablet    Sig: Take 1 tablet (10 mg total) by mouth daily.    Dispense:  90 tablet    Refill:  3    Dose increased   Signed, Tereso Newcomer, PA-C  07/07/2022 9:18 AM    [facility] [address] [place], [place], [place]  [address] Phone: [phone]; Fax: [phone]

## 2022-07-07 ENCOUNTER — Encounter: Payer: Self-pay | Admitting: Physician Assistant

## 2022-07-07 ENCOUNTER — Ambulatory Visit: Payer: 59 | Attending: Physician Assistant | Admitting: Physician Assistant

## 2022-07-07 VITALS — BP 144/96 | HR 77 | Ht 70.0 in | Wt 202.0 lb

## 2022-07-07 DIAGNOSIS — R5383 Other fatigue: Secondary | ICD-10-CM | POA: Diagnosis not present

## 2022-07-07 DIAGNOSIS — E785 Hyperlipidemia, unspecified: Secondary | ICD-10-CM

## 2022-07-07 DIAGNOSIS — R0609 Other forms of dyspnea: Secondary | ICD-10-CM

## 2022-07-07 DIAGNOSIS — R0602 Shortness of breath: Secondary | ICD-10-CM

## 2022-07-07 DIAGNOSIS — I25119 Atherosclerotic heart disease of native coronary artery with unspecified angina pectoris: Secondary | ICD-10-CM

## 2022-07-07 DIAGNOSIS — I10 Essential (primary) hypertension: Secondary | ICD-10-CM

## 2022-07-07 MED ORDER — AMLODIPINE BESYLATE 10 MG PO TABS: 10.0000 mg | ORAL_TABLET | Freq: Every day | ORAL | 3 refills | Status: DC

## 2022-07-07 NOTE — Assessment & Plan Note (Signed)
Blood pressure is uncontrolled.  Increase amlodipine to 10 mg daily as noted.  Continue Lopressor 25 mg twice daily.  If blood pressure remains uncontrolled, consider ACE/ARB.

## 2022-07-07 NOTE — Patient Instructions (Signed)
Medication Instructions:  Your physician has recommended you make the following change in your medication:   INCREASE: amlodipine to 10 mg by mouth once a day  *If you need a refill on your cardiac medications before your next appointment, please call your pharmacy*   Lab Work: TODAY: CBC, BMET, BNP, TSH  If you have labs (blood work) drawn today and your tests are completely normal, you will receive your results only by: MyChart Message (if you have MyChart) OR A paper copy in the mail If you have any lab test that is abnormal or we need to change your treatment, we will call you to review the results.   Testing/Procedures: Your physician has requested that you have an echocardiogram. Echocardiography is a painless test that uses sound waves to create images of your heart. It provides your doctor with information about the size and shape of your heart and how well your heart's chambers and valves are working. This procedure takes approximately one hour. There are no restrictions for this procedure. Please do NOT wear cologne, perfume, aftershave, or lotions (deodorant is allowed). Please arrive 15 minutes prior to your appointment time.  Your physician has requested that you have en exercise stress myoview. For further information please visit [email or web address] Please follow instruction sheet, as given.    Follow-Up: At [facility], you and your health needs are our priority.  As part of our continuing mission to provide you with exceptional heart care, we have created designated Provider Care Teams.  These Care Teams include your primary Cardiologist (physician) and Advanced Practice Providers (APPs -  Physician Assistants and Nurse Practitioners) who all work together to provide you with the care you need, when you need it.  We recommend signing up for the patient portal called "MyChart".  Sign up information is provided on this After Visit Summary.  MyChart is used to  connect with patients for Virtual Visits (Telemedicine).  Patients are able to view lab/test results, encounter notes, upcoming appointments, etc.  Non-urgent messages can be sent to your provider as well.   To learn more about what you can do with MyChart, go to [email or web address].    Your next appointment:   6-8 week(s)  The format for your next appointment:   In Person  Provider:   Verne Carrow, MD  or Tereso Newcomer, PA-C       Important Information About Sugar

## 2022-07-07 NOTE — Assessment & Plan Note (Signed)
History of CABG in May 2013 and subsequent non-STEMI in June 2013 secondary to occlusion of the vein graft to the OM1/OM2.  SVG-OM1/OM2 was treated with a DES.  He has had chronic angina since.  Over the past 4 to 5 months, he has noted worsening shortness of breath with exertion as well as chest discomfort described as indigestion.  He has not had any rest symptoms and he probably describes CCS class II-III angina.  Electrocardiogram is unchanged.  His blood pressure is also uncontrolled. Increase amlodipine to 10 mg daily Continue aspirin 81 mg daily, Plavix 75 mg daily, Lopressor 25 mg twice daily, Ranexa 500 mg twice daily, Lipitor 80 mg daily Arrange ETT-Myoview Arrange echocardiogram BMET, CBC, TSH, BNP Follow-up 6-8 weeks

## 2022-07-07 NOTE — Assessment & Plan Note (Signed)
Proceed with stress testing and echocardiogram as noted.  Obtain labs as noted including BNP.  He does have a history of COVID-19 in 2021.  He notes he was in the ICU.  He has had some respiratory difficulty since that time.  If cardiac workup unremarkable, consider referral to pulmonology.

## 2022-07-07 NOTE — Assessment & Plan Note (Addendum)
LDL optimal on most recent lab work.  Continue current Rx with Lipitor 80 mg daily.   

## 2022-07-09 LAB — CBC
Hematocrit: 48.8 % (ref 37.5–51.0)
Hemoglobin: 16.8 g/dL (ref 13.0–17.7)
MCH: 30.8 pg (ref 26.6–33.0)
MCHC: 34.4 g/dL (ref 31.5–35.7)
MCV: 89 fL (ref 79–97)
Platelets: 175 10*3/uL (ref 150–450)
RBC: 5.46 x10E6/uL (ref 4.14–5.80)
RDW: 13 % (ref 11.6–15.4)
WBC: 8.4 10*3/uL (ref 3.4–10.8)

## 2022-07-09 LAB — BASIC METABOLIC PANEL
BUN/Creatinine Ratio: 16 (ref 10–24)
BUN: 20 mg/dL (ref 8–27)
CO2: 22 mmol/L (ref 20–29)
Calcium: 10.6 mg/dL — ABNORMAL HIGH (ref 8.6–10.2)
Chloride: 106 mmol/L (ref 96–106)
Creatinine, Ser: 1.29 mg/dL — ABNORMAL HIGH (ref 0.76–1.27)
Glucose: 127 mg/dL — ABNORMAL HIGH (ref 70–99)
Potassium: 4.9 mmol/L (ref 3.5–5.2)
Sodium: 143 mmol/L (ref 134–144)
eGFR: 61 mL/min/{1.73_m2} (ref >59)

## 2022-07-09 LAB — PRO B NATRIURETIC PEPTIDE: NT-Pro BNP: <36 pg/mL (ref 0–376)

## 2022-07-09 LAB — TSH: TSH: 3.28 u[IU]/mL (ref 0.450–4.500)

## 2022-07-14 ENCOUNTER — Encounter (HOSPITAL_COMMUNITY): Payer: Self-pay | Admitting: *Deleted

## 2022-07-14 ENCOUNTER — Telehealth (HOSPITAL_COMMUNITY): Payer: Self-pay | Admitting: *Deleted

## 2022-07-14 NOTE — Telephone Encounter (Signed)
Instructions for upcoming stress test sent via my chart per request of patient.  Michael Spears

## 2022-07-21 ENCOUNTER — Encounter: Payer: Self-pay | Admitting: *Deleted

## 2022-07-21 ENCOUNTER — Ambulatory Visit (HOSPITAL_COMMUNITY): Payer: 59 | Attending: Physician Assistant

## 2022-07-21 DIAGNOSIS — R0602 Shortness of breath: Secondary | ICD-10-CM | POA: Diagnosis not present

## 2022-07-21 DIAGNOSIS — I25119 Atherosclerotic heart disease of native coronary artery with unspecified angina pectoris: Secondary | ICD-10-CM | POA: Diagnosis present

## 2022-07-21 DIAGNOSIS — R0609 Other forms of dyspnea: Secondary | ICD-10-CM | POA: Insufficient documentation

## 2022-07-21 LAB — MYOCARDIAL PERFUSION IMAGING
LV dias vol: 74 mL (ref 62–150)
LV sys vol: 28 mL
Nuc Stress EF: 63 %
Peak HR: 91 {beats}/min
Rest HR: 59 {beats}/min
Rest Nuclear Isotope Dose: 10.3 mCi
SDS: 0
SRS: 0
SSS: 0
ST Depression (mm): 0 mm
Stress Nuclear Isotope Dose: 30.3 mCi
TID: 1.2

## 2022-07-21 MED ORDER — REGADENOSON 0.4 MG/5ML IV SOLN
0.4000 mg | Freq: Once | INTRAVENOUS | Status: AC
  Administered 2022-07-21: 0.4 mg via INTRAVENOUS

## 2022-07-21 MED ORDER — TECHNETIUM TC 99M TETROFOSMIN IV KIT
10.3000 | PACK | Freq: Once | INTRAVENOUS | Status: AC | PRN
  Administered 2022-07-21: 10.3 via INTRAVENOUS

## 2022-07-21 MED ORDER — TECHNETIUM TC 99M TETROFOSMIN IV KIT
30.3000 | PACK | Freq: Once | INTRAVENOUS | Status: AC | PRN
  Administered 2022-07-21: 30.3 via INTRAVENOUS

## 2022-07-30 ENCOUNTER — Encounter (HOSPITAL_COMMUNITY): Payer: Self-pay

## 2022-07-30 ENCOUNTER — Ambulatory Visit (HOSPITAL_COMMUNITY): Payer: 59

## 2022-08-17 NOTE — Progress Notes (Addendum)
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 male with the above problem list.  He was last seen 07/07/2022 for the evaluation of chest pain and shortness of breath.  BNP was obtained and was normal.  Stress test was also obtained and this was normal.  An echocardiogram was ordered but has not yet been done.  He returns for follow-up. He is here alone. His chest pain is somewhat better since resuming Amlodipine 5 mg once daily. He feels his chest pain is back to his baseline. He still has issues with shortness of breath. This has been ongoing since he had COVID-19 in 2021. He has not had orthopnea, leg edema, syncope.            Reviewed and updated this encounter:   Tobacco  Allergies  Meds  Problems  Med Hx  Surg Hx  Fam Hx     Review of Systems  Respiratory:  Positive for cough (morning). Negative for wheezing.     Labs/Other Test Reviewed:   Recent Labs: 01/04/2022: ALT 18 07/07/2022: BUN 20; Creatinine, Ser 1.29; Hemoglobin 16.8; NT-Pro BNP <36; Platelets 175; Potassium 4.9; Sodium 143; TSH 3.280  Recent Lipid  Panel Recent Labs    01/04/22 1011  CHOL 127  TRIG 68  HDL 48  LDLCALC 65    Risk Assessment/Calculations/Metrics:         HYPERTENSION CONTROL Vitals:   08/18/22 0909 08/18/22 0944  BP: 138/70 (!) 144/80    The patient's blood pressure is elevated above target today.  In order to address the patient's elevated BP:       Physical Exam:   VS:  BP (!) 144/80   Pulse 77   Ht 5\' 10"  (1.778 m)   Wt 202 lb (91.6 kg)   BMI 28.98 kg/m    Wt Readings from Last 3 Encounters:  08/18/22 202 lb (91.6 kg)  07/21/22 202 lb (91.6 kg)  07/07/22 202 lb (91.6 kg)    Constitutional:      Appearance: Healthy appearance. Not in distress.  Neck:     Vascular: No JVR. JVD normal.  Pulmonary:     Breath sounds: Normal breath sounds. No wheezing. No rales.  Cardiovascular:     Normal rate. Regular rhythm. Normal S1. Normal S2.      Murmurs: There is a grade 2/6 systolic murmur at the LLSB.  Edema:    Peripheral edema absent.          ASSESSMENT & PLAN:   Coronary artery disease involving native coronary artery of native heart with angina pectoris [facility]) History of CABG in May  2013 and subsequent non-STEMI in June 2013 secondary to occlusion of the vein graft to the OM1/OM2.  The pLCx was treated with a DES.  He has had chronic angina since.  Recent Myoview was low risk. His symptoms are somewhat better since restarting Amlodipine 5 mg once daily. As his symptoms are stable and his Myoview is low risk, it is reasonable to continue medical Rx. We discussed the rationale for this. His BP is still s/w borderline.   Increase Amlodipine to 10 mg once daily Continue ASA 81 mg once daily, Atorvastatin 80 mg once daily, Clopidogrel 75 mg once daily, Metoprolol tartrate 25 mg twice daily, Ranolazine 500 mg twice daily, prn NTG F/u in 6 mos or sooner if symptoms change/worsen  Dyspnea on exertion He has had issues with dyspnea on exertion since he had COVID-19 in 2021. He had a CXR in 2022 with  some noted scarring. He did not get the echocardiogram due to significant cost for his Myoview. His EF is normal on the Myoview. He does have a systolic murmur. His last echocardiogram was in 2018. I have encourage him to go ahead and get the echocardiogram. This will help assess his murmur and assess his pulmonary pressures and RV function. We will try to provide him with his cost if possible. I will also refer him to Pulmonology for further evaluation.   Essential hypertension BP still above target (<130/80). Continue metoprolol tartrate 25 mg twice daily. Increase Amlodipine to 10 mg once daily.            Dispo:  Return in about 6 months (around 02/16/2023) for Routine Follow Up w Dr. Angelena Form.   Signed, Richardson Dopp, PA-C  08/18/2022 9:56 AM    [place], [place], [place]  [address] Phone: [phone]; Fax: [phone]

## 2022-08-18 ENCOUNTER — Encounter: Payer: Self-pay | Admitting: Physician Assistant

## 2022-08-18 ENCOUNTER — Ambulatory Visit: Payer: Medicare Other | Attending: Physician Assistant | Admitting: Physician Assistant

## 2022-08-18 VITALS — BP 144/80 | HR 77 | Ht 70.0 in | Wt 202.0 lb

## 2022-08-18 DIAGNOSIS — I10 Essential (primary) hypertension: Secondary | ICD-10-CM | POA: Diagnosis not present

## 2022-08-18 DIAGNOSIS — R0609 Other forms of dyspnea: Secondary | ICD-10-CM

## 2022-08-18 DIAGNOSIS — I25119 Atherosclerotic heart disease of native coronary artery with unspecified angina pectoris: Secondary | ICD-10-CM | POA: Diagnosis not present

## 2022-08-18 MED ORDER — AMLODIPINE BESYLATE 10 MG PO TABS: 10.0000 mg | ORAL_TABLET | Freq: Every day | ORAL | 3 refills | Status: DC

## 2022-08-18 NOTE — Patient Instructions (Signed)
Medication Instructions:  Your physician has recommended you make the following change in your medication:   INCREASE the Amlodipine to 10 mg taking 1 daily   *If you need a refill on your cardiac medications before your next appointment, please call your pharmacy*   Lab Work: None ordered  If you have labs (blood work) drawn today and your tests are completely normal, you will receive your results only by: [place] (if you have MyChart) OR A paper copy in the mail If you have any lab test that is abnormal or we need to change your treatment, we will call you to review the results.   Testing/Procedures: None ordered today.  I will have the precert team check on how much you would have to cover the Echocardiogram and someone will call you.   Follow-Up: At [facility], you and your health needs are our priority.  As part of our continuing mission to provide you with exceptional heart care, we have created designated Provider Care Teams.  These Care Teams include your primary Cardiologist (physician) and Advanced Practice Providers (APPs -  Physician Assistants and Nurse Practitioners) who all work together to provide you with the care you need, when you need it.  We recommend signing up for the patient portal called "MyChart".  Sign up information is provided on this After Visit Summary.  MyChart is used to connect with patients for Virtual Visits (Telemedicine).  Patients are able to view lab/test results, encounter notes, upcoming appointments, etc.  Non-urgent messages can be sent to your provider as well.   To learn more about what you can do with MyChart, go to [email or web address].    Your next appointment:   6 month(s)  Provider:   Lauree Chandler, MD     Other Instructions

## 2022-08-18 NOTE — Assessment & Plan Note (Signed)
BP still above target (<130/80). Continue metoprolol tartrate 25 mg twice daily. Increase Amlodipine to 10 mg once daily.

## 2022-08-18 NOTE — Assessment & Plan Note (Signed)
He has had issues with dyspnea on exertion since he had COVID-19 in 2021. He had a CXR in 2022 with some noted scarring. He did not get the echocardiogram due to significant cost for his Myoview. His EF is normal on the Myoview. He does have a systolic murmur. His last echocardiogram was in 2018. I have encourage him to go ahead and get the echocardiogram. This will help assess his murmur and assess his pulmonary pressures and RV function. We will try to provide him with his cost if possible. I will also refer him to Pulmonology for further evaluation.

## 2022-08-18 NOTE — Assessment & Plan Note (Signed)
History of CABG in May 2013 and subsequent non-STEMI in June 2013 secondary to occlusion of the vein graft to the OM1/OM2.  The pLCx was treated with a DES.  He has had chronic angina since.  Recent Myoview was low risk. His symptoms are somewhat better since restarting Amlodipine 5 mg once daily. As his symptoms are stable and his Myoview is low risk, it is reasonable to continue medical Rx. We discussed the rationale for this. His BP is still s/w borderline.   Increase Amlodipine to 10 mg once daily Continue ASA 81 mg once daily, Atorvastatin 80 mg once daily, Clopidogrel 75 mg once daily, Metoprolol tartrate 25 mg twice daily, Ranolazine 500 mg twice daily, prn NTG F/u in 6 mos or sooner if symptoms change/worsen

## 2022-08-19 MED ORDER — RANOLAZINE ER 500 MG PO TB12: 500.0000 mg | ORAL_TABLET | Freq: Two times a day (BID) | ORAL | 11 refills | Status: DC

## 2022-08-19 MED ORDER — AMLODIPINE BESYLATE 10 MG PO TABS: 10.0000 mg | ORAL_TABLET | Freq: Every day | ORAL | 3 refills | Status: DC

## 2022-08-19 MED ORDER — CLOPIDOGREL BISULFATE 75 MG PO TABS: 75.0000 mg | ORAL_TABLET | Freq: Every day | ORAL | 11 refills | Status: DC

## 2022-08-19 MED ORDER — ATORVASTATIN CALCIUM 80 MG PO TABS: 80.0000 mg | ORAL_TABLET | Freq: Every day | ORAL | 11 refills | Status: DC

## 2022-08-19 MED ORDER — METOPROLOL TARTRATE 25 MG PO TABS: 25.0000 mg | ORAL_TABLET | Freq: Two times a day (BID) | ORAL | 11 refills | Status: DC

## 2022-12-18 ENCOUNTER — Other Ambulatory Visit: Payer: Self-pay | Admitting: Physician Assistant

## 2023-04-11 ENCOUNTER — Telehealth: Payer: Self-pay | Admitting: Cardiovascular Disease

## 2023-04-11 NOTE — Telephone Encounter (Signed)
Called patient about his message. Asked patient to check his BP before he takes his amlodipine and hold it if his SBP is under 114. Patient verbalized understanding. Made patient an appointment to see Dr. [name].

## 2023-04-11 NOTE — Telephone Encounter (Signed)
Pt sent this via Mychart to the scheduling pool:    BP was 99 over 62 for a little while  now 114 over 64 , no dizziness, no passing out, no pain at all, just real weak feeling and cold sweat. no issue with medications, but overdue for wellness check     Good Afternoon Janson,  Can you tell me a little more about your Blood Pressure     1. What are your last 5 BP readings?  2. Are you having any other symptoms (ex. Dizziness, headache, blurred vision, passed out)?  3. What is your medication issue?      Appointment Request From: Francena Hanly  With Provider: Tereso Newcomer [facility] [address]]  Preferred Date Range: From 04/11/2023 To 06/02/2023  Preferred Times: Any  Reason: To address the following health maintenance concerns. Medicare Annual Wellness (Awv)  Comments: need my annual check up,  I have been experiencing bouts of low blood pressure  like  99 over 62

## 2023-04-12 NOTE — Progress Notes (Unsigned)
No chief complaint on file.  History of Present Illness: 67 yo male with history of CAD s/p 4V CABG in 2013  ( LIMA to mid LAD, SVG-OM1 and OM2, SVG-PDA), HLD, HTN, RBBB, chronic fatigue, fibromyalgia here today for cardiac follow up. CABG in May 2013 and then readmitted June 2013 with NSTEMI. The SVG to OM system was occluded and a drug eluting stent was placed in the native Circumflex. Echo July 2013 with moderate LVH, LVEF 65-70%, trivial pericardial effusion. Patient was seen in followup 03/10/12 by Tereso Newcomer, PA-C and continued to have difficulty with intolerance to isosorbide. This was discontinued and he was placed on amlodipine. He called in with continued chest pain and cardiac rehabilitation. He was then started on Ranexa and his chest pain was much improved. Visit here 06/21/12 and c/o dizziness and palpitations. 48 hour monitor December 2013 showed rare PVCs, PACs. He has had right leg swelling since his CABG. LE venous doppler negative for DVT December 2013. I saw him in May 2014 and he had c/o fatigue, HA, dizziness, nausea, muscle stiffness. Stress myoview June 2014 without ischemia. He c/o dyspnea and fatigue in May 2018 which lead to a nuclear stress test that showed no ischemia and an echo that showed normal LV systolic function. Echo 2018 with LVEF=60-65%. He was seen in December 2023 and reported chest pain and dyspnea. BNP was normal. Nuclear stress test December 2023 with no ischemia. Last seen in our office in January 2024 and doing well with baseline chest pain.   He is here today for follow up. The patient denies any chest pain, dyspnea, palpitations, lower extremity edema, orthopnea, PND, dizziness, near syncope or syncope.   Primary Care Physician: [name], MD  Past Medical History:  Diagnosis Date   Acute hypoxemic respiratory failure due to COVID-19 [facility]) 03/2020   Chest pain    Imdur added during admxn 7/13=> intol 2/2 HA (stopped): Norvasc added 8/13;  Ranexa 500  bid added 8/13 (QTc stable) => CP much improved   Chronic neck pain    Coronary artery disease    s/p 4V CABG 12/10/11 (LIMA to LAD, sequential SVG to OM1 & OM2, SVG to PDA);  NSTEMI 01/13/12:  LHC 01/14/12: pLAD 40%, mLAD occluded, Dx 40%, pCFX 90%, OM1 small and occluded at the ostium, OM2 small with 99% stenosis, proximal and mid RCA 50%, mRCA occluded, S-OM1 and OM2 occluded, S-PDA okay, LIMA-LAD okay, EF 60%.  PCI: Promus DES to the pCFX     Cough    Fibromyalgia    Headache(784.0)    History of tobacco abuse    0.5 packs/day for 25 years, quit 2005   HLD (hyperlipidemia)    Hypertension    Pericardial effusion    echo 12/29/11 small to moderate pericardial effusion primarily posteriorly and anterolaterally; echo 01/14/12: Mild LVH, EF 65%, moderate pericardial effusion circumferential to the heart with no evidence of Tamponade-similar to 12/29/11 ; trivial effusion by echo 7/13   Pneumonia    HX OF pna   Tobacco abuse     Past Surgical History:  Procedure Laterality Date   APPENDECTOMY     CORONARY ARTERY BYPASS GRAFT  12/10/2011   Procedure: CORONARY ARTERY BYPASS GRAFTING (CABG);  Surgeon: Delight Ovens, MD;  Location: [facility] OR;  Service: Open Heart Surgery;  Laterality: N/A;  Times four  using endoscopically harvested right greater saphenous and left internal mammary artery.   LEFT HEART CATHETERIZATION WITH CORONARY ANGIOGRAM N/A 12/07/2011  Procedure: LEFT HEART CATHETERIZATION WITH CORONARY ANGIOGRAM;  Surgeon: [name], MD;  Location[facility] CATH LAB;  Service: Cardiovascular;  Laterality: N/A;   LEFT HEART CATHETERIZATION WITH CORONARY ANGIOGRAM N/A 01/14/2012   Procedure: LEFT HEART CATHETERIZATION WITH CORONARY ANGIOGRAM;  Surgeon: [name], MD;  Location: [facility] CATH LAB;  Service: Cardiovascular;  Laterality: N/A;   MOUTH SURGERY     NASAL SINUS SURGERY     PERCUTANEOUS CORONARY STENT INTERVENTION (PCI-S)  01/14/2012   Procedure: PERCUTANEOUS CORONARY STENT  INTERVENTION (PCI-S);  Surgeon: [name], MD;  Location: [facility] CATH LAB;  Service: Cardiovascular;;    Current Outpatient Medications  Medication Sig Dispense Refill   albuterol (VENTOLIN HFA) 108 (90 Base) MCG/ACT inhaler Inhale 2 puffs into the lungs every 6 (six) hours as needed for wheezing or shortness of breath. 6.7 g 0   amLODipine (NORVASC) 10 MG tablet Take 1 tablet (10 mg total) by mouth daily. 90 tablet 3   aspirin EC 81 MG tablet Take 1 tablet (81 mg total) by mouth daily. 30 tablet 11   atorvastatin (LIPITOR) 80 MG tablet Take 1 tablet (80 mg total) by mouth daily. 30 tablet 11   clopidogrel (PLAVIX) 75 MG tablet Take 1 tablet (75 mg total) by mouth daily. 30 tablet 11   metoprolol tartrate (LOPRESSOR) 25 MG tablet TAKE 1 TABLET BY MOUTH TWICE A DAY 180 tablet 3   nitroGLYCERIN (NITROSTAT) 0.4 MG SL tablet Place 1 tablet (0.4 mg total) under the tongue every 5 (five) minutes as needed for chest pain (up to 3 doses). 25 tablet 6   ranolazine (RANEXA) 500 MG 12 hr tablet Take 1 tablet (500 mg total) by mouth 2 (two) times daily. 60 tablet 11   No current facility-administered medications for this visit.    No Known Allergies  Social History   Socioeconomic History   Marital status: Divorced    Spouse name: Not on file   Number of children: Not on file   Years of education: Not on file   Highest education level: Not on file  Occupational History   Not on file  Tobacco Use   Smoking status: Former    Current packs/day: 0.00    Average packs/day: 0.5 packs/day for 25.0 years (12.5 ttl pk-yrs)    Types: Cigarettes    Start date: 12/07/1978    Quit date: 12/07/2003    Years since quitting: 19.3   Smokeless tobacco: Never  Substance and Sexual Activity   Alcohol use: No   Drug use: No   Sexual activity: Yes  Other Topics Concern   Not on file  Social History Narrative   Not on file   Social Determinants of Health   Financial Resource Strain: Not on file   Food Insecurity: Not on file  Transportation Needs: Not on file  Physical Activity: Not on file  Stress: Not on file  Social Connections: Not on file  Intimate Partner Violence: Not on file    Family History  Problem Relation Age of Onset   Hypertension Mother    Hypertension Father    Crohn's disease Father     Review of Systems:  As stated in the HPI and otherwise negative.   There were no vitals taken for this visit.  Physical Examination:  General: Well developed, well nourished, NAD  HEENT: OP clear, mucus membranes moist  SKIN: warm, dry. No rashes. Neuro: No focal deficits  Musculoskeletal: Muscle strength 5/5 all ext  Psychiatric: Mood  and affect normal  Neck: No JVD, no carotid bruits, no thyromegaly, no lymphadenopathy.  Lungs:Clear bilaterally, no wheezes, rhonci, crackles Cardiovascular: Regular rate and rhythm. No murmurs, gallops or rubs. Abdomen:Soft. Bowel sounds present. Non-tender.  Extremities: No lower extremity edema. Pulses are 2 + in the bilateral DP/PT.  Echo May 2018: - Left ventricle: The cavity size was normal. Systolic function was   normal. The estimated ejection fraction was in the range of 60%   to 65%. Wall motion was normal; there were no regional wall   motion abnormalities. Left ventricular diastolic function   parameters were normal. Doppler parameters are consistent with   indeterminate ventricular filling pressure. - Aortic valve: Transvalvular velocity was within the normal range.   There was no stenosis. There was no regurgitation. - Mitral valve: Transvalvular velocity was within the normal range.   There was no evidence for stenosis. There was trivial   regurgitation. - Right ventricle: The cavity size was normal. Wall thickness was   normal. Systolic function was normal. - Tricuspid valve: There was trivial regurgitation. - Pulmonary arteries: Systolic pressure was within the normal   range. PA peak pressure: 22 mm Hg  (S).  EKG:  EKG is *** ordered today. The ekg ordered today demonstrates   Recent Labs: 07/07/2022: BUN 20; Creatinine, Ser 1.29; Hemoglobin 16.8; NT-Pro BNP <36; Platelets 175; Potassium 4.9; Sodium 143; TSH 3.280   Lipid Panel    Component Value Date/Time   CHOL 127 01/04/2022 1011   TRIG 68 01/04/2022 1011   HDL 48 01/04/2022 1011   CHOLHDL 2.6 01/04/2022 1011   CHOLHDL 4 09/21/2013 0836   VLDL 39.2 09/21/2013 0836   LDLCALC 65 01/04/2022 1011     Wt Readings from Last 3 Encounters:  08/18/22 91.6 kg  07/21/22 91.6 kg  07/07/22 91.6 kg     Assessment and Plan:   1. CAD with stable angina: No change in baseline chronic chest pain. Continue ASA, Plavix, statin, Norvasc and beta blocker.      2. Hyperlipidemia: LDL 65 in June 2023. Continue statin. *** check lipids and LFTs today  3. HTN: BP is well controlled. No changes today  Labs/ tests ordered today include:  No orders of the defined types were placed in this encounter.  Disposition:   FU with me in 12  months  Signed, Verne Carrow, MD 04/12/2023 12:43 PM    [facility] [address] [place], [place], [place]  [address] Phone: [phone]; Fax: [phone]

## 2023-04-13 ENCOUNTER — Encounter: Payer: Self-pay | Admitting: Cardiovascular Disease

## 2023-04-13 ENCOUNTER — Ambulatory Visit: Payer: Medicare Other | Attending: Cardiovascular Disease | Admitting: Cardiovascular Disease

## 2023-04-13 VITALS — BP 118/72 | HR 57 | Ht 70.0 in | Wt 202.4 lb

## 2023-04-13 DIAGNOSIS — E785 Hyperlipidemia, unspecified: Secondary | ICD-10-CM | POA: Diagnosis not present

## 2023-04-13 DIAGNOSIS — I25119 Atherosclerotic heart disease of native coronary artery with unspecified angina pectoris: Secondary | ICD-10-CM

## 2023-04-13 DIAGNOSIS — I10 Essential (primary) hypertension: Secondary | ICD-10-CM

## 2023-04-13 LAB — HEPATIC FUNCTION PANEL
ALT: 25 IU/L (ref 0–44)
AST: 20 IU/L (ref 0–40)
Albumin: 4.8 g/dL (ref 3.9–4.9)
Alkaline Phosphatase: 88 IU/L (ref 44–121)
Bilirubin Total: 0.7 mg/dL (ref 0.0–1.2)
Bilirubin, Direct: 0.18 mg/dL (ref 0.00–0.40)
Total Protein: 6.4 g/dL (ref 6.0–8.5)

## 2023-04-13 LAB — BASIC METABOLIC PANEL
BUN/Creatinine Ratio: 13 (ref 10–24)
BUN: 18 mg/dL (ref 8–27)
CO2: 24 mmol/L (ref 20–29)
Calcium: 10.1 mg/dL (ref 8.6–10.2)
Chloride: 105 mmol/L (ref 96–106)
Creatinine, Ser: 1.37 mg/dL — ABNORMAL HIGH (ref 0.76–1.27)
Glucose: 153 mg/dL — ABNORMAL HIGH (ref 70–99)
Potassium: 4.3 mmol/L (ref 3.5–5.2)
Sodium: 140 mmol/L (ref 134–144)
eGFR: 57 mL/min/{1.73_m2} — ABNORMAL LOW (ref >59)

## 2023-04-13 LAB — LIPID PANEL
Chol/HDL Ratio: 3.4 ratio (ref 0.0–5.0)
Cholesterol, Total: 141 mg/dL (ref 100–199)
HDL: 41 mg/dL (ref >39)
LDL Chol Calc (NIH): 76 mg/dL (ref 0–99)
Triglycerides: 134 mg/dL (ref 0–149)
VLDL Cholesterol Cal: 24 mg/dL (ref 5–40)

## 2023-04-13 NOTE — Patient Instructions (Signed)
Medication Instructions:  No changes *If you need a refill on your cardiac medications before your next appointment, please call your pharmacy*   Lab Work: Today: lipids, liver, bmet If you have labs (blood work) drawn today and your tests are completely normal, you will receive your results only by: MyChart Message (if you have MyChart) OR A paper copy in the mail If you have any lab test that is abnormal or we need to change your treatment, we will call you to review the results.   Testing/Procedures: none   Follow-Up: At [facility], you and your health needs are our priority.  As part of our continuing mission to provide you with exceptional heart care, we have created designated Provider Care Teams.  These Care Teams include your primary Cardiologist (physician) and Advanced Practice Providers (APPs -  Physician Assistants and Nurse Practitioners) who all work together to provide you with the care you need, when you need it.   Your next appointment:   12 month(s)  Provider:   Verne Carrow, MD

## 2023-08-22 ENCOUNTER — Other Ambulatory Visit: Payer: Self-pay | Admitting: Physician Assistant

## 2023-09-20 ENCOUNTER — Encounter: Payer: Self-pay | Admitting: Cardiovascular Disease

## 2023-09-20 MED ORDER — RANOLAZINE ER 500 MG PO TB12: 500.0000 mg | ORAL_TABLET | Freq: Two times a day (BID) | ORAL | 6 refills | Status: AC

## 2023-09-20 MED ORDER — NITROGLYCERIN 0.4 MG SL SUBL: 0.4000 mg | SUBLINGUAL_TABLET | SUBLINGUAL | 3 refills | Status: AC | PRN

## 2023-09-27 ENCOUNTER — Other Ambulatory Visit: Payer: Self-pay | Admitting: Physician Assistant

## 2023-10-04 ENCOUNTER — Other Ambulatory Visit: Payer: Self-pay | Admitting: Physician Assistant

## 2023-12-20 ENCOUNTER — Telehealth: Payer: Self-pay | Admitting: Cardiovascular Disease

## 2023-12-20 MED ORDER — FUROSEMIDE 40 MG PO TABS: 40.0000 mg | ORAL_TABLET | Freq: Every day | ORAL | 3 refills | Status: AC | PRN

## 2023-12-20 NOTE — Telephone Encounter (Signed)
 Can we start Lasix  40 mg per day and have him take this for 3 days to see if it helps? Office f/u if possible over next few weeks with a DOD, APP or me? Thanks, chris    Left message for patient to call back.

## 2023-12-20 NOTE — Telephone Encounter (Signed)
 Patient returned call.  He is in agreement to start lasix  40 mg daily for 3 days.  Will call if symptoms do not improve or worsen before visit which I've scheduled for Mon June 2 with Dr. Abel Hoe.  Pt grateful for assistance.

## 2023-12-20 NOTE — Telephone Encounter (Signed)
  Per MyChart scheduling message:  Initial complaint: fatigue and shortness of breath   Pt c/o Shortness Of Breath: STAT if SOB developed within the last 24 hours or pt is noticeably SOB on the phone  1. Are you currently SOB (can you hear that pt is SOB on the phone)?   2. How long have you been experiencing SOB?   3. Are you SOB when sitting or when up moving around?   4. Are you currently experiencing any other symptoms?    Always been a little short of breath especially since my bout with covid. Check my records. Lately I have gotten a little worse when I am active and a small spell or6 two when I have been sitting down. Could be stress. I don't know, but I need check up anyway.

## 2023-12-20 NOTE — Telephone Encounter (Signed)
 Spoke with pt who is reporting he has had shortness of breath since his 2nd heart attack which has became worse since having covid but now seems to be "more intense" in the last 3 to 4 weeks.   He reports it can occur with activity or when he is doing "practically nothing."  He does not have to sit down and rest but does feel like he has to take a deep breath.  He denies any recent weight changes.  He has noticed some edema at his sock line in the afternoon-it's "a little swollen."  Advised to keep feet and legs elevated during the day as much as possible and consider wearing knee high compression stockings.  He is not currently prescribed any type of diuretic.  Advised I will forward this information to Dr Abel Hoe and Aloma Jaksch, RN for review and follow up.  He was appreciative of the call and assistance.  He will call back if further questions or concerns.

## 2024-01-02 ENCOUNTER — Encounter: Payer: Self-pay | Admitting: Cardiovascular Disease

## 2024-01-02 ENCOUNTER — Ambulatory Visit: Attending: Cardiovascular Disease | Admitting: Cardiovascular Disease

## 2024-01-02 VITALS — BP 114/66 | HR 57 | Ht 70.0 in | Wt 205.8 lb

## 2024-01-02 DIAGNOSIS — I25119 Atherosclerotic heart disease of native coronary artery with unspecified angina pectoris: Secondary | ICD-10-CM

## 2024-01-02 DIAGNOSIS — I10 Essential (primary) hypertension: Secondary | ICD-10-CM

## 2024-01-02 DIAGNOSIS — E785 Hyperlipidemia, unspecified: Secondary | ICD-10-CM

## 2024-01-02 NOTE — Progress Notes (Signed)
 Chief Complaint  Patient presents with   Follow-up    CAD   History of Present Illness: 68 yo male with history of CAD s/p 4V CABG in [place]  ( LIMA to mid LAD, SVG-OM1 and OM2, SVG-PDA), HLD, HTN, RBBB, chronic fatigue, fibromyalgia here today for cardiac follow up. CABG in May [place] and then readmitted June [place] with NSTEMI. The SVG to OM system was occluded and a drug eluting stent was placed in the native Circumflex. Echo July [place] with moderate LVH, LVEF 65-70%, trivial pericardial effusion. Patient was seen in followup 03/10/12 by [name], PA-C and continued to have difficulty with intolerance to isosorbide . This was discontinued and he was placed on amlodipine . He called in with continued chest pain and cardiac rehabilitation. He was then started on Ranexa  and his chest pain was much improved. Visit here 06/21/12 and c/o dizziness and palpitations. 48 hour monitor December [place] showed rare PVCs, PACs. He has had right leg swelling since his CABG. LE venous doppler negative for DVT December [place]. I saw him in May 2014 and he had c/o fatigue, HA, dizziness, nausea, muscle stiffness. Stress myoview  June 2014 without ischemia. He c/o dyspnea and fatigue in May 2018 which lead to a nuclear stress test that showed no ischemia and an echo that showed normal LV systolic function. Echo 2018 with LVEF=60-65%. He was seen in December 2023 and reported chest pain and dyspnea. BNP was normal. Nuclear stress test December 2023 with no ischemia.   He is here today for follow up. He called our office last week reporting LE edema and dyspnea. He denies any chest pain, palpitations, orthopnea, PND, dizziness, near syncope or syncope.   Primary Care Physician: Sun, Vyvyan, MD  Past Medical History:  Diagnosis Date   Acute hypoxemic respiratory failure due to COVID-19 [facility]) 03/2020   Chest pain    Imdur  added during admxn 7/13=> intol 2/2 HA (stopped): Norvasc  added 8/13;  Ranexa  500 bid added 8/13 (QTc  stable) => CP much improved   Chronic neck pain    Coronary artery disease    s/p 4V CABG 12/10/11 (LIMA to LAD, sequential SVG to OM1 & OM2, SVG to PDA);  NSTEMI 01/13/12:  LHC 01/14/12: pLAD 40%, mLAD occluded, Dx 40%, pCFX 90%, OM1 small and occluded at the ostium, OM2 small with 99% stenosis, proximal and mid RCA 50%, mRCA occluded, S-OM1 and OM2 occluded, S-PDA okay, LIMA-LAD okay, EF 60%.  PCI: Promus DES to the pCFX     Cough    Fibromyalgia    Headache(784.0)    History of tobacco abuse    0.5 packs/day for 25 years, quit 2005   HLD (hyperlipidemia)    Hypertension    Pericardial effusion    echo 12/29/11 small to moderate pericardial effusion primarily posteriorly and anterolaterally; echo 01/14/12: Mild LVH, EF 65%, moderate pericardial effusion circumferential to the heart with no evidence of Tamponade-similar to 12/29/11 ; trivial effusion by echo 7/13   Pneumonia    HX OF pna   Tobacco abuse     Past Surgical History:  Procedure Laterality Date   APPENDECTOMY     CORONARY ARTERY BYPASS GRAFT  5/10/[place]   Procedure: CORONARY ARTERY BYPASS GRAFTING (CABG);  Surgeon: Norita Beauvais, MD;  Location: [facility] OR;  Service: Open Heart Surgery;  Laterality: N/A;  Times four  using endoscopically harvested right greater saphenous and left internal mammary artery.   LEFT HEART CATHETERIZATION WITH CORONARY ANGIOGRAM N/A 5/7/[place]  Procedure: LEFT HEART CATHETERIZATION WITH CORONARY ANGIOGRAM;  Surgeon: Odie Benne, MD;  Location: [facility] CATH LAB;  Service: Cardiovascular;  Laterality: N/A;   LEFT HEART CATHETERIZATION WITH CORONARY ANGIOGRAM N/A 6/14/[place]   Procedure: LEFT HEART CATHETERIZATION WITH CORONARY ANGIOGRAM;  Surgeon: Odie Benne, MD;  Location: [facility] CATH LAB;  Service: Cardiovascular;  Laterality: N/A;   MOUTH SURGERY     NASAL SINUS SURGERY     PERCUTANEOUS CORONARY STENT INTERVENTION (PCI-S)  6/14/[place]   Procedure: PERCUTANEOUS CORONARY STENT INTERVENTION  (PCI-S);  Surgeon: Odie Benne, MD;  Location: [facility] CATH LAB;  Service: Cardiovascular;;    Current Outpatient Medications  Medication Sig Dispense Refill   albuterol  (VENTOLIN  HFA) 108 (90 Base) MCG/ACT inhaler Inhale 2 puffs into the lungs every 6 (six) hours as needed for wheezing or shortness of breath. 6.7 g 0   amLODipine  (NORVASC ) 10 MG tablet TAKE 1 TABLET(10 MG) BY MOUTH DAILY 90 tablet 2   aspirin  EC 81 MG tablet Take 1 tablet (81 mg total) by mouth daily. 30 tablet 11   atorvastatin  (LIPITOR ) 80 MG tablet TAKE 1 TABLET(80 MG) BY MOUTH DAILY 90 tablet 2   clopidogrel  (PLAVIX ) 75 MG tablet TAKE 1 TABLET(75 MG) BY MOUTH DAILY 30 tablet 6   furosemide  (LASIX ) 40 MG tablet Take 1 tablet (40 mg total) by mouth daily as needed. 30 tablet 3   metoprolol  tartrate (LOPRESSOR ) 25 MG tablet TAKE 1 TABLET(25 MG) BY MOUTH TWICE DAILY 180 tablet 2   nitroGLYCERIN  (NITROSTAT ) 0.4 MG SL tablet Place 1 tablet (0.4 mg total) under the tongue every 5 (five) minutes as needed for chest pain (up to 3 doses). 25 tablet 3   ranolazine  (RANEXA ) 500 MG 12 hr tablet Take 1 tablet (500 mg total) by mouth 2 (two) times daily. 60 tablet 6   No current facility-administered medications for this visit.    No Known Allergies  Social History   Socioeconomic History   Marital status: Divorced    Spouse name: Not on file   Number of children: Not on file   Years of education: Not on file   Highest education level: Not on file  Occupational History   Not on file  Tobacco Use   Smoking status: Former    Current packs/day: 0.00    Average packs/day: 0.5 packs/day for 25.0 years (12.5 ttl pk-yrs)    Types: Cigarettes    Start date: 12/07/1978    Quit date: 12/07/2003    Years since quitting: 20.0   Smokeless tobacco: Never  Substance and Sexual Activity   Alcohol use: No   Drug use: No   Sexual activity: Yes  Other Topics Concern   Not on file  Social History Narrative   Not on file   Social  Drivers of Health   Financial Resource Strain: Not on file  Food Insecurity: Not on file  Transportation Needs: Not on file  Physical Activity: Not on file  Stress: Not on file  Social Connections: Not on file  Intimate Partner Violence: Not on file    Family History  Problem Relation Age of Onset   Hypertension Mother    Hypertension Father    Crohn's disease Father     Review of Systems:  As stated in the HPI and otherwise negative.   BP 114/66   Pulse (!) 57   Ht 5\' 10"  (1.778 m)   Wt 205 lb 12.8 oz (93.4 kg)   SpO2 95%   BMI  29.53 kg/m   Physical Examination: General: Well developed, well nourished, NAD  HEENT: OP clear, mucus membranes moist  SKIN: warm, dry. No rashes. Neuro: No focal deficits  Musculoskeletal: Muscle strength 5/5 all ext  Psychiatric: Mood and affect normal  Neck: No JVD, no carotid bruits, no thyromegaly, no lymphadenopathy.  Lungs:Clear bilaterally, no wheezes, rhonci, crackles Cardiovascular: Regular rate and rhythm. No murmurs, gallops or rubs. Abdomen:Soft. Bowel sounds present. Non-tender.  Extremities: No lower extremity edema. Pulses are 2 + in the bilateral DP/PT.  EKG:  EKG is ordered today. The ekg ordered today demonstrates  EKG Interpretation Date/Time:  Monday January 02 2024 08:17:28 EDT Ventricular Rate:  54 PR Interval:  182 QRS Duration:  142 QT Interval:  442 QTC Calculation: 419 R Axis:   -26  Text Interpretation: Sinus bradycardia Right bundle branch block Confirmed by Antoinette Batman [id number]) on 01/02/2024 8:40:40 AM   Recent Labs: 04/13/2023: ALT 25; BUN 18; Creatinine, Ser 1.37; Potassium 4.3; Sodium 140   Lipid Panel    Component Value Date/Time   CHOL 141 04/13/2023 0900   TRIG 134 04/13/2023 0900   HDL 41 04/13/2023 0900   CHOLHDL 3.4 04/13/2023 0900   CHOLHDL 4 09/21/2013 0836   VLDL 39.2 09/21/2013 0836   LDLCALC 76 04/13/2023 0900     Wt Readings from Last 3 Encounters:  01/02/24 205 lb 12.8 oz  (93.4 kg)  04/13/23 202 lb 6.4 oz (91.8 kg)  08/18/22 202 lb (91.6 kg)    Assessment and Plan:   1. CAD with stable angina: He has stable chronic angina with no recent worsened chest pain. Continue ASA, Plavix , statin, beta blocker and Norvasc . Rare dyspnea. Will repeat echo now to assess LVEF.   2. Hyperlipidemia: LDL near goal in September 2024. Continue statin.   3. HTN: BP is well controlled. Continue current medications.   Labs/ tests ordered today include:   Orders Placed This Encounter  Procedures   EKG 12-Lead   ECHOCARDIOGRAM COMPLETE   Disposition:   F/U with me in 12  months  Signed, Antoinette Batman, MD 01/02/2024 10:27 AM    [facility] [address] [place], [place], [place]  [address] Phone: [phone]; Fax: [phone]

## 2024-01-02 NOTE — Patient Instructions (Signed)
 Medication Instructions:  No changes *If you need a refill on your cardiac medications before your next appointment, please call your pharmacy*  Lab Work: none If you have labs (blood work) drawn today and your tests are completely normal, you will receive your results only by: MyChart Message (if you have MyChart) OR A paper copy in the mail If you have any lab test that is abnormal or we need to change your treatment, we will call you to review the results.  Testing/Procedures: Your physician has requested that you have an echocardiogram. Echocardiography is a painless test that uses sound waves to create images of your heart. It provides your doctor with information about the size and shape of your heart and how well your heart's chambers and valves are working. This procedure takes approximately one hour. There are no restrictions for this procedure. Please do NOT wear cologne, perfume, aftershave, or lotions (deodorant is allowed). Please arrive 15 minutes prior to your appointment time.  Please note: We ask at that you not bring children with you during ultrasound (echo/ vascular) testing. Due to room size and safety concerns, children are not allowed in the ultrasound rooms during exams. Our front office staff cannot provide observation of children in our lobby area while testing is being conducted. An adult accompanying a patient to their appointment will only be allowed in the ultrasound room at the discretion of the ultrasound technician under special circumstances. We apologize for any inconvenience.   Follow-Up: At [facility], you and your health needs are our priority.  As part of our continuing mission to provide you with exceptional heart care, our providers are all part of one team.  This team includes your primary Cardiologist (physician) and Advanced Practice Providers or APPs (Physician Assistants and Nurse Practitioners) who all work together to provide you with the  care you need, when you need it.  Your next appointment:   12 month(s)  Provider:   Antoinette Batman, MD

## 2024-02-22 ENCOUNTER — Other Ambulatory Visit: Payer: Self-pay | Admitting: Cardiovascular Disease

## 2024-03-05 ENCOUNTER — Encounter: Payer: Self-pay | Admitting: Cardiovascular Disease

## 2024-03-29 ENCOUNTER — Other Ambulatory Visit: Payer: Self-pay | Admitting: Cardiovascular Disease

## 2024-03-31 ENCOUNTER — Other Ambulatory Visit: Payer: Self-pay | Admitting: Cardiovascular Disease

## 2024-04-11 ENCOUNTER — Ambulatory Visit: Admitting: Cardiovascular Disease

## 2024-05-21 ENCOUNTER — Other Ambulatory Visit: Payer: Self-pay

## 2024-05-24 MED ORDER — AMLODIPINE BESYLATE 10 MG PO TABS: 10.0000 mg | ORAL_TABLET | Freq: Every day | ORAL | 2 refills | Status: AC

## 2024-07-02 ENCOUNTER — Encounter: Payer: Self-pay | Admitting: Cardiovascular Disease

## 2024-07-02 MED ORDER — ATORVASTATIN CALCIUM 80 MG PO TABS: ORAL_TABLET | ORAL | 3 refills | Status: AC
